# Patient Record
Sex: Female | Born: 1945 | Race: White | Hispanic: No | Marital: Married | Smoking: Never smoker
Health system: Southern US, Community
[De-identification: ages and names within clinical notes are randomized; demographics above are authoritative.]

## PROBLEM LIST (undated history)

## (undated) DIAGNOSIS — F0390 Unspecified dementia without behavioral disturbance: Secondary | ICD-10-CM

## (undated) DIAGNOSIS — F419 Anxiety disorder, unspecified: Secondary | ICD-10-CM

## (undated) DIAGNOSIS — C801 Malignant (primary) neoplasm, unspecified: Secondary | ICD-10-CM

## (undated) DIAGNOSIS — T7840XA Allergy, unspecified, initial encounter: Secondary | ICD-10-CM

## (undated) DIAGNOSIS — K219 Gastro-esophageal reflux disease without esophagitis: Secondary | ICD-10-CM

## (undated) DIAGNOSIS — K37 Unspecified appendicitis: Secondary | ICD-10-CM

## (undated) DIAGNOSIS — K635 Polyp of colon: Secondary | ICD-10-CM

## (undated) HISTORY — DX: Unspecified appendicitis: K37

## (undated) HISTORY — PX: BREAST EXCISIONAL BIOPSY: SUR124

## (undated) HISTORY — PX: APPENDECTOMY: SHX54

## (undated) HISTORY — DX: Allergy, unspecified, initial encounter: T78.40XA

## (undated) HISTORY — DX: Polyp of colon: K63.5

## (undated) HISTORY — DX: Gastro-esophageal reflux disease without esophagitis: K21.9

## (undated) HISTORY — PX: ABDOMINAL HYSTERECTOMY: SHX81

## (undated) HISTORY — DX: Anxiety disorder, unspecified: F41.9

---

## 2012-09-18 ENCOUNTER — Ambulatory Visit: Payer: Self-pay | Admitting: Family Medicine

## 2012-12-22 ENCOUNTER — Ambulatory Visit: Payer: Self-pay | Admitting: Gastroenterology

## 2014-02-10 DIAGNOSIS — M5417 Radiculopathy, lumbosacral region: Secondary | ICD-10-CM | POA: Diagnosis not present

## 2014-02-10 DIAGNOSIS — M9906 Segmental and somatic dysfunction of lower extremity: Secondary | ICD-10-CM | POA: Diagnosis not present

## 2014-02-10 DIAGNOSIS — M9903 Segmental and somatic dysfunction of lumbar region: Secondary | ICD-10-CM | POA: Diagnosis not present

## 2014-02-10 DIAGNOSIS — M79661 Pain in right lower leg: Secondary | ICD-10-CM | POA: Diagnosis not present

## 2014-02-10 DIAGNOSIS — M79662 Pain in left lower leg: Secondary | ICD-10-CM | POA: Diagnosis not present

## 2014-02-11 DIAGNOSIS — H5203 Hypermetropia, bilateral: Secondary | ICD-10-CM | POA: Diagnosis not present

## 2014-04-15 ENCOUNTER — Ambulatory Visit (INDEPENDENT_AMBULATORY_CARE_PROVIDER_SITE_OTHER): Payer: Medicare Other | Admitting: Primary Care

## 2014-04-15 ENCOUNTER — Encounter: Payer: Self-pay | Admitting: Primary Care

## 2014-04-15 VITALS — BP 118/74 | HR 74 | Temp 97.8°F | Ht 59.5 in | Wt 246.4 lb

## 2014-04-15 DIAGNOSIS — R21 Rash and other nonspecific skin eruption: Secondary | ICD-10-CM | POA: Insufficient documentation

## 2014-04-15 DIAGNOSIS — M8580 Other specified disorders of bone density and structure, unspecified site: Secondary | ICD-10-CM | POA: Diagnosis not present

## 2014-04-15 DIAGNOSIS — E669 Obesity, unspecified: Secondary | ICD-10-CM | POA: Diagnosis not present

## 2014-04-15 MED ORDER — TRIAMCINOLONE ACETONIDE 0.1 % EX CREA: 1.0000 "application " | TOPICAL_CREAM | Freq: Two times a day (BID) | CUTANEOUS | Status: DC

## 2014-04-15 NOTE — Assessment & Plan Note (Signed)
Body mass index is 48.95 kg/(m^2).  She's very interested in losing weight and plans to start working more on her diet and exercise. I spent some time stressing the importance of this and she verbalized understanding. Will do fasting physical in April. Will continue to monitor.

## 2014-04-15 NOTE — Progress Notes (Signed)
Pre visit review using our clinic review tool, if applicable. No additional management support is needed unless otherwise documented below in the visit note. 

## 2014-04-15 NOTE — Patient Instructions (Addendum)
Apply cream to rash twice daily. Do not use on face. Continue your efforts of weight loss through your diet and exercise. Take some time for yourself at least once weekly to reduce stress. Schedule a fasting physical with me at your convenience within the next 1-2 months. Welcome to [facility]!

## 2014-04-15 NOTE — Assessment & Plan Note (Signed)
Suspect Eczema. RX for Triamcinolone 0.1% cream for legs. Will continue to monitor.

## 2014-04-15 NOTE — Progress Notes (Signed)
Subjective:    Patient ID: Lindsay Snyder, female    DOB: [birth year], 69 y.o.   MRN: [id number]  HPI  Lindsay Snyder is a 69 year old female who presents today to establish care.  1) Osteopenia: Last Dexa scan was 2014, in which patient was notified that she had osteopenia. She's taking calcium and vitamin D, and is consuming calcium through her diet with milk and salads. She's had 2 fractures as a young adult but nothing within the past 40 years.   2) Obesity: She's very interested in losing weight and lost 10 pounds in 2014 through the Grapefruit Diet. She has not been on the Grapefruit diet in a while, and plans to start it again. She does reports a relatively healthy diet most of the time when not on a specific diet plan. She is not exercising but plans on exercising in the pool once the weather warms up. She is starting to gain some of her weight back from 2014 which she attributes to stress related to family matters.   Body mass index is 48.95 kg/(m^2).  3) Rash: She first noticed this skin irruption three months ago to the left lower leg. The rash was initially itchy in nature so she tried OTC hydrocortisone cream, neosporin, and lotion which helped. The rash is no longer itchy, has not worsened, but is still present with a smaller presentation occuring to the right leg. Nothing makes her rash worse and denies pain.   Review of Systems  Constitutional: Negative for fatigue and unexpected weight change.  HENT: Negative for rhinorrhea.        Has seasonal allergies and history of sinus infections.  Respiratory: Negative for shortness of breath.   Cardiovascular: Negative for chest pain.  Gastrointestinal: Negative for diarrhea and constipation.  Endocrine: Negative for polydipsia, polyphagia and polyuria.  Genitourinary: Negative for dysuria and frequency.  Musculoskeletal: Positive for back pain. Negative for myalgias and arthralgias.       Occasional back pain if sitting in chair the  wrong way.  Skin: Positive for rash.       See HPI.  Neurological: Negative for dizziness and headaches.  Hematological: Negative for adenopathy.  Psychiatric/Behavioral:       Denies anxiety and depression.       Past Medical History  Diagnosis Date  . Appendicitis 8   . Polyp of colon     Benign    History   Social History  . Marital Status: Married    Spouse Name: N/A  . Number of Children: N/A  . Years of Education: N/A   Occupational History  . Not on file.   Social History Main Topics  . Smoking status: Never Smoker   . Smokeless tobacco: Not on file  . Alcohol Use: No  . Drug Use: No  . Sexual Activity: Not on file   Other Topics Concern  . Not on file   Social History Narrative   From [place], moved to [place] for her ill daughter, moved to [place], moved back to [place].   Married. Retired.   Lives in [place].   Enjoys babysitting, writing, reading, cooking.          Past Surgical History  Procedure Laterality Date  . Abdominal hysterectomy    . Appendectomy      Family History  Problem Relation Age of Onset  . Heart attack Father 4    Deceased  . Cancer Father     Colon  .  Cancer Sister     Unusual blood cancer  . Diabetes Mother     Deceased    Allergies  Allergen Reactions  . Tylenol [Acetaminophen]     No current outpatient prescriptions on file prior to visit.   No current facility-administered medications on file prior to visit.    BP 118/74 mmHg  Pulse 74  Temp(Src) 97.8 F (36.6 C) (Oral)  Ht 4' 11.5" (1.511 m)  Wt 246 lb 6.4 oz (111.766 kg)  BMI 48.95 kg/m2  SpO2 98%    Objective:   Physical Exam  Constitutional: She is oriented to person, place, and time. She appears well-developed.  HENT:  Head: Normocephalic.  Right Ear: External ear normal.  Left Ear: External ear normal.  Nose: Nose normal.  Mouth/Throat: Oropharynx is clear and moist.  Eyes: Conjunctivae and EOM are normal. Pupils are equal, round, and  reactive to light.  Neck: Neck supple.  Cardiovascular: Normal rate and regular rhythm.   Pulmonary/Chest: Effort normal and breath sounds normal.  Abdominal: Soft. Bowel sounds are normal. She exhibits no mass. There is no tenderness.  Lymphadenopathy:    She has no cervical adenopathy.  Neurological: She is alert and oriented to person, place, and time.  Skin: Skin is warm and dry. Rash noted.  2 present to anterior left lower extremity proximal to ankle, 4cm and 2cm in diameter.  1 present to anterior right lower extremity proximal to ankle 1.5 cm in diameter.  Psychiatric: She has a normal mood and affect.          Assessment & Plan:

## 2014-04-15 NOTE — Assessment & Plan Note (Signed)
Patient reports from last DEXA which was in 2014. Managed on Calcium, vitamin D, and diet. Will repeat this year.

## 2014-04-27 ENCOUNTER — Encounter: Payer: Self-pay | Admitting: Primary Care

## 2014-04-27 DIAGNOSIS — I8393 Asymptomatic varicose veins of bilateral lower extremities: Secondary | ICD-10-CM | POA: Insufficient documentation

## 2014-05-17 ENCOUNTER — Encounter: Payer: Self-pay | Admitting: Primary Care

## 2014-05-17 ENCOUNTER — Telehealth: Payer: Self-pay | Admitting: *Deleted

## 2014-05-17 ENCOUNTER — Ambulatory Visit (INDEPENDENT_AMBULATORY_CARE_PROVIDER_SITE_OTHER): Payer: Medicare Other | Admitting: Primary Care

## 2014-05-17 VITALS — BP 120/76 | HR 68 | Temp 97.4°F | Ht 60.0 in | Wt 241.8 lb

## 2014-05-17 DIAGNOSIS — Z130 Encounter for screening for diseases of the blood and blood-forming organs and certain disorders involving the immune mechanism: Secondary | ICD-10-CM

## 2014-05-17 DIAGNOSIS — R011 Cardiac murmur, unspecified: Secondary | ICD-10-CM

## 2014-05-17 DIAGNOSIS — Z1239 Encounter for other screening for malignant neoplasm of breast: Secondary | ICD-10-CM | POA: Diagnosis not present

## 2014-05-17 DIAGNOSIS — Z1329 Encounter for screening for other suspected endocrine disorder: Secondary | ICD-10-CM

## 2014-05-17 DIAGNOSIS — E669 Obesity, unspecified: Secondary | ICD-10-CM

## 2014-05-17 DIAGNOSIS — Z13220 Encounter for screening for lipoid disorders: Secondary | ICD-10-CM

## 2014-05-17 DIAGNOSIS — Z23 Encounter for immunization: Secondary | ICD-10-CM | POA: Diagnosis not present

## 2014-05-17 DIAGNOSIS — R21 Rash and other nonspecific skin eruption: Secondary | ICD-10-CM

## 2014-05-17 DIAGNOSIS — Z0000 Encounter for general adult medical examination without abnormal findings: Secondary | ICD-10-CM | POA: Insufficient documentation

## 2014-05-17 DIAGNOSIS — R010 Benign and innocent cardiac murmurs: Secondary | ICD-10-CM

## 2014-05-17 LAB — CBC WITH DIFFERENTIAL/PLATELET
Basophils Absolute: 0 10*3/uL (ref 0.0–0.1)
Basophils Relative: 0.4 % (ref 0.0–3.0)
Eosinophils Absolute: 0.1 10*3/uL (ref 0.0–0.7)
Eosinophils Relative: 1.6 % (ref 0.0–5.0)
HCT: 49.1 % — ABNORMAL HIGH (ref 36.0–46.0)
Hemoglobin: 16.6 g/dL — ABNORMAL HIGH (ref 12.0–15.0)
LYMPHS PCT: 21.2 % (ref 12.0–46.0)
Lymphs Abs: 1.4 10*3/uL (ref 0.7–4.0)
MCHC: 33.8 g/dL (ref 30.0–36.0)
MCV: 85.7 fl (ref 78.0–100.0)
Monocytes Absolute: 0.5 10*3/uL (ref 0.1–1.0)
Monocytes Relative: 7 % (ref 3.0–12.0)
Neutro Abs: 4.6 10*3/uL (ref 1.4–7.7)
Neutrophils Relative %: 69.8 % (ref 43.0–77.0)
Platelets: 216 10*3/uL (ref 150.0–400.0)
RBC: 5.73 Mil/uL — ABNORMAL HIGH (ref 3.87–5.11)
RDW: 14 % (ref 11.5–15.5)
WBC: 6.6 10*3/uL (ref 4.0–10.5)

## 2014-05-17 LAB — LIPID PANEL
CHOL/HDL RATIO: 4
Cholesterol: 228 mg/dL — ABNORMAL HIGH (ref 0–200)
HDL: 56 mg/dL (ref >39.00)
LDL CALC: 139 mg/dL — AB (ref 0–99)
NONHDL: 172
TRIGLYCERIDES: 164 mg/dL — AB (ref 0.0–149.0)
VLDL: 32.8 mg/dL (ref 0.0–40.0)

## 2014-05-17 LAB — COMPREHENSIVE METABOLIC PANEL
ALK PHOS: 64 U/L (ref 39–117)
ALT: 13 U/L (ref 0–35)
AST: 20 U/L (ref 0–37)
Albumin: 4.2 g/dL (ref 3.5–5.2)
BUN: 18 mg/dL (ref 6–23)
CO2: 29 mEq/L (ref 19–32)
Calcium: 9.8 mg/dL (ref 8.4–10.5)
Chloride: 104 mEq/L (ref 96–112)
Creatinine, Ser: 0.95 mg/dL (ref 0.40–1.20)
GFR: 61.85 mL/min (ref >60.00)
Glucose, Bld: 98 mg/dL (ref 70–99)
POTASSIUM: 4.7 meq/L (ref 3.5–5.1)
Sodium: 139 mEq/L (ref 135–145)
Total Bilirubin: 0.8 mg/dL (ref 0.2–1.2)
Total Protein: 7.4 g/dL (ref 6.0–8.3)

## 2014-05-17 NOTE — Patient Instructions (Addendum)
Complete lab work prior to leaving today. I will notify you of your results. You will be contacted regarding your mammogram and echocardiogram (to further evaluate your murmur). You've been provided a Tetanus vaccine today. You are covered for 10 years. Continue your efforts on weight loss! Try to incorporate 30 minutes of daily exercise at least 5 days a week. Limit your carbohydrates, sugars, and substitute with fruits, vegetables, and lean meats. You may purchase Debrox drops/Wax Kit over the counter to help with wax buildup. Follow up in 6 weeks for a check-up on your weight loss efforts. It was great to see you today! Take care!

## 2014-05-17 NOTE — Assessment & Plan Note (Signed)
Health maintenance: Up to date, Tetanus provided today, mammogram ordered today. Diet and Exercise: Lost 5 pounds since last month and has been working to improve her diet. She plans to start exercising this spring in the pool, but in the meantime will start walking around the neighborhood.  No falls, no signs of depression. Labs obtained this afternoon. Will review with patient and treat if necessary.

## 2014-05-17 NOTE — Progress Notes (Signed)
Pre visit review using our clinic review tool, if applicable. No additional management support is needed unless otherwise documented below in the visit note. 

## 2014-05-17 NOTE — Progress Notes (Addendum)
Patient ID: Lindsay Snyder, female   DOB: 07/14/1945, 69 y.o.   MRN: [id number]  HPI: Lindsay Snyder presents today for a medicare annual wellness visit, subsequent.  Past Medical History  Diagnosis Date  . Appendicitis 8   . Polyp of colon     Benign    Current Outpatient Prescriptions  Medication Sig Dispense Refill  . calcium carbonate 200 MG capsule Take 750 mg by mouth daily.    . Cholecalciferol 5000 UNITS capsule Take 5,000 Units by mouth daily.    . Flaxseed, Linseed, (FLAXSEED OIL) 1200 MG CAPS Take 1 capsule by mouth daily.    . Lutein 20 MG CAPS Take 1 capsule by mouth daily.    . Methylsulfonylmethane (MSM) 1500 MG TABS Take 1 tablet by mouth.    . Misc Natural Products (OSTEO BI-FLEX TRIPLE STRENGTH) TABS Take 1 tablet by mouth.    . triamcinolone cream (KENALOG) 0.1 % Apply 1 application topically 2 (two) times daily. 30 g 0   No current facility-administered medications for this visit.    Allergies  Allergen Reactions  . Tylenol [Acetaminophen]     Family History  Problem Relation Age of Onset  . Heart attack Father 87    Deceased  . Cancer Father     Colon  . Cancer Sister     Unusual blood cancer  . Diabetes Mother     Deceased    History   Social History  . Marital Status: Married    Spouse Name: N/A  . Number of Children: N/A  . Years of Education: N/A   Occupational History  . Not on file.   Social History Main Topics  . Smoking status: Never Smoker   . Smokeless tobacco: Not on file  . Alcohol Use: No  . Drug Use: No  . Sexual Activity: Not on file   Other Topics Concern  . Not on file   Social History Narrative   From [place], moved to [place] for her ill daughter, moved to [place], moved back to [place].   Married. Retired.   Lives in [place].   Enjoys babysitting, writing, reading, cooking.          Hospitiliaztions: None  Health Maintenance:    Flu: Did not have one in Fall 2015.  Tetanus: Last one 10 years ago.  Prevnar:  2014  Zostavax: Reports she's never had chicken pox.  Bone Density: Last Dexa 08/2012, due in August 2016.  Colonoscopy: Last one was 2014, due in November 2019  Eye Doctor: Last appointment Winter 2015. Starting to get cataracts, on Lutein 20mg .  Dental Exam: Has not been in a while, plans on an appointment in August. Has no complaints.  Mammogram: Due, will order today.       Providers: Marcina Millard, O.D - Optometrist, Alma Friendly AGNP-C - PCP   I have personally reviewed and have noted: 1. The patient's medical and social history 2. Their use of alcohol, tobacco or illicit drugs. Special occasions will have a beer, no tobacco or illicit drugs. 3. Their current medications and supplements 4. The patient's functional ability including ADL's, fall risks, home safety risks and hearing or visual impairment. 5. Diet and physical activities 6. Evidence for depression or mood disorder  Subjective:   Review of Systems:   Constitutional: Denies fever, malaise, fatigue, headache or abrupt weight changes. Feels cold. HEENT: Denies eye pain, eye redness, ear pain, ringing in the ears, wax buildup, runny nose, nasal congestion, bloody nose,  or sore throat. Does have ear fullness, sinus pressure to maxillary/frontal lobes. Has been present for 2 weeks. Clear drainage. Has not taken anything OTC. Respiratory: Denies difficulty breathing, shortness of breath, cough or sputum production.   Cardiovascular: Denies chest pain, chest tightness, palpitations or swelling in the hands or feet.  Gastrointestinal: Denies abdominal pain, bloating, constipation, diarrhea or blood in the stool.  GU: Denies urgency, frequency, pain with urination, burning sensation, blood in urine, odor or discharge. Musculoskeletal: Denies decrease in range of motion, difficulty with gait, muscle pain or joint pain and swelling.  Skin: Denies redness, rashes, lesions or ulcercations. Rash is improving bilaterally from last  visit. Neurological: Denies dizziness, difficulty with memory, difficulty with speech or problems with balance and coordination.   No other specific complaints in a complete review of systems (except as listed in HPI above).  Objective:  PE:   BP 120/76 mmHg  Pulse 68  Temp(Src) 97.4 F (36.3 C) (Oral)  Ht 5' (1.524 m)  Wt 241 lb 12.8 oz (109.68 kg)  BMI 47.22 kg/m2  SpO2 98% Wt Readings from Last 3 Encounters:  05/17/14 241 lb 12.8 oz (109.68 kg)  04/15/14 246 lb 6.4 oz (111.766 kg)    General: Appears their stated age, well developed, well nourished in NAD. Skin: Warm, dry and intact. No rashes, lesions or ulcerations noted. HEENT: Head: normal shape and size; Eyes: sclera white, no icterus, conjunctiva pink, PERRLA and EOMs intact; Ears: Tm's gray and intact, normal light reflex; Nose: mucosa pink and moist, septum midline; Throat/Mouth: Teeth present, mucosa pink and moist, no exudate, lesions or ulcerations noted. Tender to maxillary and frontal sinuses.  Neck: Normal range of motion. Neck supple, trachea midline. No massses, lumps or thyromegaly present.  Cardiovascular: Normal rate and rhythm. S1,S2 noted.  + murmur noted on exam- patient has no prior history,  rubs or gallops noted. No JVD or BLE edema. No carotid bruits noted. Pulmonary/Chest: Normal effort and positive vesicular breath sounds. No respiratory distress. No wheezes, rales or ronchi noted.  Abdomen: Soft and nontender. Normal bowel sounds, no bruits noted. No distention or masses noted. Liver, spleen and kidneys non palpable. Musculoskeletal: Normal range of motion. No signs of joint swelling. No difficulty with gait.  Neurological: Alert and oriented. Cranial nerves II-XII intact. Coordination normal. +DTRs bilaterally. Psychiatric: Mood and affect normal. Behavior is normal. Judgment and thought content normal.   BMET No results found for: NA, K, CL, CO2, GLUCOSE, BUN, CREATININE, CALCIUM, GFRNONAA,  GFRAA  Lipid Panel  No results found for: CHOL, TRIG, HDL, CHOLHDL, VLDL, LDLCALC  CBC No results found for: WBC, RBC, HGB, HCT, PLT, MCV, MCH, MCHC, RDW, LYMPHSABS, MONOABS, EOSABS, BASOSABS  Hgb A1C No results found for: HGBA1C    Assessment and Plan:   Medicare Annual Wellness Visit:  Diet: Has been working on a healthier diet including fruits, vegetables, water. She's been participating on the "grapefruit" diet. Wt Readings from Last 3 Encounters:  05/17/14 241 lb 12.8 oz (109.68 kg)  04/15/14 246 lb 6.4 oz (111.766 kg)   Physical activity: Does not currently, but does housework daily; plans to start exercising in the pool once open. Depression/mood screen: Negative Hearing: Intact to whispered voice Visual acuity: Grossly normal, performs annual eye exam  ADLs: Capable Fall risk: None Home safety: Good Cognitive evaluation: Intact to orientation, naming, recall and repetition EOL planning: Adv directives, full code.  Sinusitis present upon exam and recommended antibiotics due to duration of symptoms and presentation.  She declines at this time.  Next appointment:  May-June 2016 to follow upon weight loss efforts.

## 2014-05-17 NOTE — Assessment & Plan Note (Signed)
Improved and is nearly resolved. She has no complaints.

## 2014-05-17 NOTE — Progress Notes (Signed)
   Subjective:    Patient ID: Lindsay Snyder, female    DOB: January 27, 1946, 69 y.o.   MRN: [id number]  HPI  Lindsay Snyder is a 69 year old female who presents today for complete physical.  Immunizations: -Tetanus -Influenza -Pneumonia -Shingles  Diet: Exercise: Colonoscopy: Last was in 2014, due in 2019 Dexa: Last Dexa was in 08/2012, due this August for repeat. Pap Smear: Previous Paps were  Mammogram:   Review of Systems     Objective:   Physical Exam        Assessment & Plan:

## 2014-05-17 NOTE — Assessment & Plan Note (Signed)
Noted on examination today. Systolic, mitral involvement. No radiation to carotids.  She denies history of and has no complaints of weakness, shortness of breath, chest pain. Will obtain 2D echo for further evaluation.

## 2014-05-17 NOTE — Telephone Encounter (Signed)
lmov to let pt know we had to cancel her apt, for we are going on a new system and we will call back when we are able to sch apt

## 2014-05-18 ENCOUNTER — Other Ambulatory Visit (INDEPENDENT_AMBULATORY_CARE_PROVIDER_SITE_OTHER): Payer: Medicare Other

## 2014-05-18 DIAGNOSIS — Z131 Encounter for screening for diabetes mellitus: Secondary | ICD-10-CM | POA: Diagnosis not present

## 2014-05-18 LAB — HEMOGLOBIN A1C: HEMOGLOBIN A1C: 5.5 % (ref 4.6–6.5)

## 2014-05-20 ENCOUNTER — Other Ambulatory Visit (HOSPITAL_COMMUNITY): Payer: Medicare Other

## 2014-05-20 ENCOUNTER — Telehealth: Payer: Self-pay | Admitting: Primary Care

## 2014-05-20 DIAGNOSIS — J019 Acute sinusitis, unspecified: Principal | ICD-10-CM

## 2014-05-20 DIAGNOSIS — B9689 Other specified bacterial agents as the cause of diseases classified elsewhere: Secondary | ICD-10-CM

## 2014-05-20 MED ORDER — AMOXICILLIN-POT CLAVULANATE 875-125 MG PO TABS: 1.0000 | ORAL_TABLET | Freq: Two times a day (BID) | ORAL | Status: DC

## 2014-05-20 NOTE — Telephone Encounter (Signed)
Pt left v/m returning call and pt request cb; did not leave contact #.

## 2014-05-20 NOTE — Telephone Encounter (Signed)
Pt called stating she saw kate on Monday.  At that appointment pt didn't want an antibiotic.  She thinks she needs one now not feeling any better She has fever, one ear is still stop up. She has an headache [facility]

## 2014-05-20 NOTE — Addendum Note (Signed)
Addended by: Pleas Koch on: 05/20/2014 04:31 PM   Modules accepted: Miquel Dunn

## 2014-05-20 NOTE — Telephone Encounter (Signed)
Called patient and left message. Will call in Augmentin for suspected bacterial sinusitis. This was obvious during her exam on 4/18 but she declined antibiotics at that time.  Vallarie Mare, will you ensure that she has received this message and picked up her antibiotics tomorrow (Friday).  Thanks!

## 2014-05-21 NOTE — Telephone Encounter (Signed)
Called patient and notified her of Kate's comments. Patient verbalized understanding. Patient will call if worsen or no improvement after taking antibiotics.

## 2014-05-24 ENCOUNTER — Telehealth: Payer: Self-pay | Admitting: Primary Care

## 2014-05-24 DIAGNOSIS — B9689 Other specified bacterial agents as the cause of diseases classified elsewhere: Secondary | ICD-10-CM

## 2014-05-24 DIAGNOSIS — J019 Acute sinusitis, unspecified: Principal | ICD-10-CM

## 2014-05-24 MED ORDER — DOXYCYCLINE HYCLATE 100 MG PO TABS: 100.0000 mg | ORAL_TABLET | Freq: Two times a day (BID) | ORAL | Status: DC

## 2014-05-24 NOTE — Telephone Encounter (Signed)
Called and notified patient of Kate's comments. Patient verbalized understanding.  

## 2014-05-24 NOTE — Telephone Encounter (Addendum)
Pt left v/m; pt has taken 4 pills of abx and then stopped abx due to diarrhea and dizziness. Pt wants to know if should get different abx or try Augmentin again. Pt request cb.[facility].

## 2014-05-24 NOTE — Telephone Encounter (Signed)
Please notify Ms. Beecham to stop taking the Augmentin and that I have sent in Doxycycline tablets. She will take one tablet by mouth twice daily for 10 days. Let me know if she has any additional questions. Thanks!

## 2014-05-25 ENCOUNTER — Ambulatory Visit (HOSPITAL_COMMUNITY): Payer: Medicare Other | Attending: Primary Care

## 2014-05-25 DIAGNOSIS — R010 Benign and innocent cardiac murmurs: Secondary | ICD-10-CM | POA: Insufficient documentation

## 2014-05-25 DIAGNOSIS — R011 Cardiac murmur, unspecified: Secondary | ICD-10-CM | POA: Diagnosis not present

## 2014-05-25 NOTE — Progress Notes (Signed)
2D Echo completed. 05/25/2014

## 2014-05-31 ENCOUNTER — Other Ambulatory Visit: Payer: Medicare Other

## 2014-06-16 ENCOUNTER — Other Ambulatory Visit: Payer: Self-pay | Admitting: Primary Care

## 2014-06-16 ENCOUNTER — Ambulatory Visit
Admission: RE | Admit: 2014-06-16 | Discharge: 2014-06-16 | Disposition: A | Discharge status: Home / Self Care | Payer: Medicare Other | Source: Ambulatory Visit | Attending: Primary Care | Admitting: Primary Care

## 2014-06-16 DIAGNOSIS — R922 Inconclusive mammogram: Secondary | ICD-10-CM | POA: Diagnosis not present

## 2014-06-16 DIAGNOSIS — Z1239 Encounter for other screening for malignant neoplasm of breast: Secondary | ICD-10-CM | POA: Diagnosis not present

## 2014-06-29 ENCOUNTER — Encounter: Payer: Self-pay | Admitting: Primary Care

## 2014-06-29 ENCOUNTER — Ambulatory Visit (INDEPENDENT_AMBULATORY_CARE_PROVIDER_SITE_OTHER): Payer: Medicare Other | Admitting: Primary Care

## 2014-06-29 VITALS — BP 118/66 | HR 88 | Temp 97.6°F | Ht 60.0 in | Wt 240.8 lb

## 2014-06-29 DIAGNOSIS — E669 Obesity, unspecified: Secondary | ICD-10-CM

## 2014-06-29 DIAGNOSIS — R011 Cardiac murmur, unspecified: Secondary | ICD-10-CM | POA: Diagnosis not present

## 2014-06-29 NOTE — Assessment & Plan Note (Signed)
Weight loss of 6 pounds since initial visit. Commended patient on efforts. Encouraged her to exercise daily, even if it's walking outside for 30-45 min daily.  Follow up in 3 months for re-evaluation.

## 2014-06-29 NOTE — Progress Notes (Signed)
Subjective:    Patient ID: Lindsay Snyder, female    DOB: [birth year], 69 y.o.   MRN: [id number]  HPI  Lindsay Snyder is a 69 year old female who presents today for follow up.  She has been on the "Grapefruit diet" but for the past several weeks but is tired of it and plans on losing weight through a healthy diet and exercise.  This weekend she presented to the grocery and bought fresh fruits and vegetables. She plans on cutting out sugars, starchy foods (potatoes, breads), and fatty meats that she was eating on the "grapefruit diet". She is currently not exercising due to dealing with family stressors but plans on swimming a lot this summer.   Breakfast: Berniece Salines and eggs Lunch: Salad or meat and vegetables Dinner: Salad or meat and vegetables  She's not currently eating desserts.  She is drinking mostly grapefruit juice, water, decaf tea.   Wt Readings from Last 3 Encounters:  06/29/14 240 lb 12.8 oz (109.226 kg)  05/17/14 241 lb 12.8 oz (109.68 kg)  04/15/14 246 lb 6.4 oz (111.766 kg)     Review of Systems  Respiratory: Negative for shortness of breath.   Cardiovascular: Negative for chest pain.  Neurological: Negative for dizziness and weakness.       Past Medical History  Diagnosis Date  . Appendicitis 8   . Polyp of colon     Benign    History   Social History  . Marital Status: Married    Spouse Name: N/A  . Number of Children: N/A  . Years of Education: N/A   Occupational History  . Not on file.   Social History Main Topics  . Smoking status: Never Smoker   . Smokeless tobacco: Not on file  . Alcohol Use: No  . Drug Use: No  . Sexual Activity: Not on file   Other Topics Concern  . Not on file   Social History Narrative   From [place], moved to [place] for her ill daughter, moved to [place], moved back to [place].   Married. Retired.   Lives in [place].   Enjoys babysitting, writing, reading, cooking.          Past Surgical History  Procedure Laterality  Date  . Abdominal hysterectomy    . Appendectomy    . Breast excisional biopsy Right     + 20 years    Family History  Problem Relation Age of Onset  . Heart attack Father 76    Deceased  . Cancer Father     Colon  . Cancer Sister     Unusual blood cancer  . Diabetes Mother     Deceased    Allergies  Allergen Reactions  . Tylenol [Acetaminophen]     Current Outpatient Prescriptions on File Prior to Visit  Medication Sig Dispense Refill  . calcium carbonate 200 MG capsule Take 750 mg by mouth daily.    . Cholecalciferol 5000 UNITS capsule Take 5,000 Units by mouth daily.    Marland Kitchen doxycycline (VIBRA-TABS) 100 MG tablet Take 1 tablet (100 mg total) by mouth 2 (two) times daily. 20 tablet 0  . Flaxseed, Linseed, (FLAXSEED OIL) 1200 MG CAPS Take 1 capsule by mouth daily.    . Lutein 20 MG CAPS Take 1 capsule by mouth daily.    . Methylsulfonylmethane (MSM) 1500 MG TABS Take 1 tablet by mouth.    . Misc Natural Products (OSTEO BI-FLEX TRIPLE STRENGTH) TABS Take 1 tablet by  mouth.     No current facility-administered medications on file prior to visit.    BP 118/66 mmHg  Pulse 88  Temp(Src) 97.6 F (36.4 C) (Oral)  Ht 5' (1.524 m)  Wt 240 lb 12.8 oz (109.226 kg)  BMI 47.03 kg/m2  SpO2 98%    Objective:   Physical Exam  Constitutional: She is oriented to person, place, and time. She appears well-nourished.  Cardiovascular: Normal rate and regular rhythm.   Pulmonary/Chest: Effort normal and breath sounds normal.  Neurological: She is alert and oriented to person, place, and time.  Skin: Skin is warm and dry.          Assessment & Plan:

## 2014-06-29 NOTE — Progress Notes (Signed)
Pre visit review using our clinic review tool, if applicable. No additional management support is needed unless otherwise documented below in the visit note. 

## 2014-06-29 NOTE — Patient Instructions (Signed)
You've lost 6 pounds since March! Keep up the good work. It is important to limit carbohydrates in the form of white bread, rice, pasta, cakes, cookies, sugary drinks, etc. Increase your consumption of fresh fruits and vegetables. Be sure to drink plenty of water daily.  Follow up in 3 months for follow up of weight loss. Please don't hesitate to call me if you have any concerns. Take time for yourself! It was nice seeing you!

## 2014-06-29 NOTE — Assessment & Plan Note (Signed)
Discussed echo results with patient as she was worried. Aortic stenosis present on Echo with EF of 55-60%. Education provided that her results are nothing to worry about at this time.

## 2014-07-26 ENCOUNTER — Telehealth: Payer: Self-pay | Admitting: Primary Care

## 2014-07-26 NOTE — Telephone Encounter (Signed)
Pt stated she has cold.  She has started cough up phlem  She wanted to know if kate would call her in some cough med  [facility]

## 2014-07-26 NOTE — Telephone Encounter (Signed)
Called patient and asked her regarding the OTC medication below. Patient stated she is talking OTC medications. Patient was describing her symptoms--she stated scratchy throat, sore throat, and productive cough. She did not very well over the phone. Suggested to patient that she may need to be seen. Patient decline apt. She stated she feels better than this morning and will wait until tomorrow morning if not better then will call.

## 2014-07-26 NOTE — Telephone Encounter (Signed)
She may try Delsym or Robitussin OTC for cough. Has she tried either of these things?

## 2014-07-27 ENCOUNTER — Ambulatory Visit: Payer: Medicare Other | Admitting: Primary Care

## 2014-08-03 ENCOUNTER — Ambulatory Visit: Payer: Medicare Other | Admitting: Family Medicine

## 2014-08-04 ENCOUNTER — Encounter: Payer: Self-pay | Admitting: Family Medicine

## 2014-08-04 ENCOUNTER — Ambulatory Visit (INDEPENDENT_AMBULATORY_CARE_PROVIDER_SITE_OTHER): Payer: Medicare Other | Admitting: Family Medicine

## 2014-08-04 VITALS — BP 114/82 | HR 69 | Temp 98.5°F | Ht 60.0 in | Wt 240.5 lb

## 2014-08-04 DIAGNOSIS — J320 Chronic maxillary sinusitis: Secondary | ICD-10-CM | POA: Diagnosis not present

## 2014-08-04 MED ORDER — AMOXICILLIN-POT CLAVULANATE 875-125 MG PO TABS: 1.0000 | ORAL_TABLET | Freq: Two times a day (BID) | ORAL | Status: DC

## 2014-08-04 NOTE — Progress Notes (Signed)
Pre visit review using our clinic review tool, if applicable. No additional management support is needed unless otherwise documented below in the visit note. 

## 2014-08-04 NOTE — Progress Notes (Signed)
Dr. Frederico Hamman T. Maki Sweetser, MD, [facility] [place], [address] Phone: [phone] Fax: [phone]  08/04/2014  Patient: Lindsay Snyder, MRN: [id number], DOB: 1945/03/06, 69 y.o.  Primary Physician:  Sheral Flow, NP  Chief Complaint: Sinusitis  Subjective:   This 69 y.o. female patient presents with runny nose, sneezing, cough, sore throat, malaise and minimal / low-grade fever for > 1 week. Now the primary complaint has become sinus pressure and pain behind the eyes and in the upper, anterior face.   Has been feeling bad for about 2-3 weeks.  Wynia and yellow discharge.   Took some cough syrup - did not help all that much.  Took some mucinex, helped a little bit.   The patent denies sore throat as the primary complaint. Denies sthortness of breath/wheezing, high fever, chest pain, significant myalgia, otalgia, abdominal pain, changes in bowel or bladder.  PMH, PHS, Allergies, Problem List, Medications, Family History, and Social History have all been reviewed.  Patient Active Problem List   Diagnosis Date Noted  . Medicare annual wellness visit, subsequent 05/17/2014  . Murmur, cardiac 05/17/2014  . 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  . Rash and nonspecific skin eruption 04/15/2014  . Osteopenia 04/15/2014  . Obesity 04/15/2014    Past Medical History  Diagnosis Date  . Appendicitis 8   . Polyp of colon     Benign    Past Surgical History  Procedure Laterality Date  . Abdominal hysterectomy    . Appendectomy    . Breast excisional biopsy Right     + 20 years    History   Social History  . Marital Status: Married    Spouse Name: N/A  . Number of Children: N/A  . Years of Education: N/A   Occupational History  . Not on file.   Social History Main Topics  . Smoking status: Never Smoker   . Smokeless tobacco: Never Used  . Alcohol Use: No  . Drug Use: No  . Sexual  Activity: Not on file   Other Topics Concern  . Not on file   Social History Narrative   From [place], moved to [place] for her ill daughter, moved to [place], moved back to [place].   Married. Retired.   Lives in [place].   Enjoys babysitting, writing, reading, cooking.          Family History  Problem Relation Age of Onset  . Heart attack Father 22    Deceased  . Cancer Father     Colon  . Cancer Sister     Unusual blood cancer  . Diabetes Mother     Deceased    Allergies  Allergen Reactions  . Tylenol [Acetaminophen]     Medication list reviewed and updated in full in [facility].  ROS as above, eating and drinking - tolerating PO. Urinating normally. No excessive vomitting or diarrhea. O/w as above.  Objective:   Blood pressure 114/82, pulse 69, temperature 98.5 F (36.9 C), temperature source Oral, height 5' (1.524 m), weight 240 lb 8 oz (109.09 kg).  GEN: WDWN, Non-toxic, Atraumatic, normocephalic. A and O x 3. HEENT: Oropharynx clear without exudate, MMM, no significant LAD, mild rhinnorhea Sinuses: Right Frontal, ethmoid, and maxillary: Tender at max Left Frontal, Ethmoid, and maxillary: nonTender Ears: TM clear, COL visualized with good landmarks CV: RRR, no m/g/r. Pulm: CTA B, no wheezes, rhonchi, or crackles, normal respiratory effort.  EXT: no c/c/e Psych: well oriented, neither depressed nor anxious in appearance  Assessment and Plan:   Right maxillary sinusitis  Acute sinusitis: ABX as below.   Reviewed symptomatic care as well as ABX in this case.    Follow-up: prn  New Prescriptions   AMOXICILLIN-CLAVULANATE (AUGMENTIN) 875-125 MG PER TABLET    Take 1 tablet by mouth 2 (two) times daily.   No orders of the defined types were placed in this encounter.    Signed,  Maud Deed. Donney Caraveo, MD  Patient's Medications  New Prescriptions   AMOXICILLIN-CLAVULANATE (AUGMENTIN) 875-125 MG PER TABLET    Take 1 tablet by mouth 2 (two) times daily.    Previous Medications   ASPIRIN 81 MG TABLET    Take 81 mg by mouth daily.   CALCIUM CARBONATE 200 MG CAPSULE    Take 750 mg by mouth daily.   CHOLECALCIFEROL 5000 UNITS CAPSULE    Take 5,000 Units by mouth daily.   FLAXSEED, LINSEED, (FLAXSEED OIL) 1200 MG CAPS    Take 1 capsule by mouth daily.   LUTEIN 20 MG CAPS    Take 1 capsule by mouth daily.   MISC NATURAL PRODUCTS (OSTEO BI-FLEX TRIPLE STRENGTH) TABS    Take 2 tablets by mouth.   Modified Medications   No medications on file  Discontinued Medications   DOXYCYCLINE (VIBRA-TABS) 100 MG TABLET    Take 1 tablet (100 mg total) by mouth 2 (two) times daily.   METHYLSULFONYLMETHANE (MSM) 1500 MG TABS    Take 1 tablet by mouth.

## 2014-09-02 DIAGNOSIS — D239 Other benign neoplasm of skin, unspecified: Secondary | ICD-10-CM | POA: Diagnosis not present

## 2014-09-02 DIAGNOSIS — D485 Neoplasm of uncertain behavior of skin: Secondary | ICD-10-CM | POA: Diagnosis not present

## 2014-09-02 DIAGNOSIS — L820 Inflamed seborrheic keratosis: Secondary | ICD-10-CM | POA: Diagnosis not present

## 2014-09-02 DIAGNOSIS — L739 Follicular disorder, unspecified: Secondary | ICD-10-CM | POA: Diagnosis not present

## 2014-09-30 ENCOUNTER — Encounter: Payer: Self-pay | Admitting: Primary Care

## 2014-09-30 ENCOUNTER — Ambulatory Visit (INDEPENDENT_AMBULATORY_CARE_PROVIDER_SITE_OTHER): Payer: Medicare Other | Admitting: Primary Care

## 2014-09-30 VITALS — BP 126/86 | HR 60 | Temp 97.4°F | Ht 60.0 in | Wt 234.8 lb

## 2014-09-30 DIAGNOSIS — E669 Obesity, unspecified: Secondary | ICD-10-CM

## 2014-09-30 DIAGNOSIS — F5102 Adjustment insomnia: Secondary | ICD-10-CM

## 2014-09-30 MED ORDER — TRAZODONE HCL 50 MG PO TABS: 50.0000 mg | ORAL_TABLET | Freq: Every evening | ORAL | Status: DC | PRN

## 2014-09-30 NOTE — Patient Instructions (Signed)
Continue your efforts towards weight loss through healthy diet and exercise.  You should aim to get 1 hour of moderate intensity exercise 4-5 times weekly.  Start Trazodone tablets for sleep. Take 1-2 tablets by mouth 30 minutes to 1 hour before bed for sleep.  Please schedule a follow up appointment in 6 weeks for re-evaluation of stress and sleep.  It was a pleasure to see you today!

## 2014-09-30 NOTE — Assessment & Plan Note (Signed)
Weight loss of 6 pounds since last visit. Commended patient on this success. Currently improving her diet and is not exercising. She believes her weight loss has been stagnant due to recent family stress and lack of sleep. Will continue to monitor weight.

## 2014-09-30 NOTE — Progress Notes (Signed)
Pre visit review using our clinic review tool, if applicable. No additional management support is needed unless otherwise documented below in the visit note. 

## 2014-09-30 NOTE — Assessment & Plan Note (Signed)
History of insomnia in the past. Symptoms today represent insomnia due to family stress. Does not seem to have regular anxiety. Start Trazodone 50 mg. 1-2 tabs HS. Discussed potential side effects. Follow up in 6 weeks for re-evaluation.

## 2014-09-30 NOTE — Progress Notes (Signed)
Subjective:    Patient ID: Lindsay Snyder, female    DOB: 08-20-1945, 69 y.o.   MRN: [id number]  HPI  Lindsay Snyder is a 69 year old female who presents today for follow up of weight loss efforts. She was last evaluated on 5/31 for weight loss efforts. At that time she was still participating in the "Grapefruit Diet" but expressed frustration and was determined to lose weight by cutting out sugars, starchy foods, and fatty meats.  Since her last visit she's lost 6 pounds. She is becoming frustrated as her weight has been stagnant due to recent family stress.   Her diet currently consists of: Breakfast: [place] peanut butter bars, tea Lunch: Salad or Meat and Veggies, or shredded wheat Dinner: Aeronautical engineer and Veggies, occasionally wheat bread; or shredded wheat Snack: Bowl of cereal, fruit Desserts: None Beverages: Water, tea with stevia, Grapefruit juice with stevia  She is not currently exercising.  Wt Readings from Last 3 Encounters:  09/30/14 234 lb 12.8 oz (106.505 kg)  08/04/14 240 lb 8 oz (109.09 kg)  06/29/14 240 lb 12.8 oz (109.226 kg)   2) Insomnia: Longstanding history. Recently is going through stress as her son is going through a difficult divorce and her daughter is going to have surgery. She has difficulty falling asleep and staying asleep and is getting about 2-4 hours of sleep on average. She has tried valerian root, melatonin, natural sleep aids from a health store without relief. She denies daily worry and irritability. She does have muscle tension, difficulty sleeping, and is going through family stress. PHQ 2 score of 0 today.  Review of Systems  Constitutional: Negative for unexpected weight change.  Respiratory: Negative for shortness of breath.   Cardiovascular: Negative for chest pain.  Neurological: Negative for dizziness and headaches.  Psychiatric/Behavioral: Positive for sleep disturbance. Negative for suicidal ideas. The patient is not  nervous/anxious.        Past Medical History  Diagnosis Date  . Appendicitis 8   . Polyp of colon     Benign    Social History   Social History  . Marital Status: Married    Spouse Name: N/A  . Number of Children: N/A  . Years of Education: N/A   Occupational History  . Not on file.   Social History Main Topics  . Smoking status: Never Smoker   . Smokeless tobacco: Never Used  . Alcohol Use: No  . Drug Use: No  . Sexual Activity: Not on file   Other Topics Concern  . Not on file   Social History Narrative   From [place], moved to [place] for her ill daughter, moved to [place], moved back to [place].   Married. Retired.   Lives in [place].   Enjoys babysitting, writing, reading, cooking.          Past Surgical History  Procedure Laterality Date  . Abdominal hysterectomy    . Appendectomy    . Breast excisional biopsy Right     + 20 years    Family History  Problem Relation Age of Onset  . Heart attack Father 21    Deceased  . Cancer Father     Colon  . Cancer Sister     Unusual blood cancer  . Diabetes Mother     Deceased    Allergies  Allergen Reactions  . Tylenol [Acetaminophen]     Current Outpatient Prescriptions on File Prior to Visit  Medication Sig Dispense  Refill  . aspirin 81 MG tablet Take 81 mg by mouth daily.    . calcium carbonate 200 MG capsule Take 750 mg by mouth daily.    . Cholecalciferol 5000 UNITS capsule Take 5,000 Units by mouth daily.    . Flaxseed, Linseed, (FLAXSEED OIL) 1200 MG CAPS Take 1 capsule by mouth daily.    . Lutein 20 MG CAPS Take 1 capsule by mouth daily.    . Misc Natural Products (OSTEO BI-FLEX TRIPLE STRENGTH) TABS Take 2 tablets by mouth.      No current facility-administered medications on file prior to visit.    BP 126/86 mmHg  Pulse 60  Temp(Src) 97.4 F (36.3 C) (Oral)  Ht 5' (1.524 m)  Wt 234 lb 12.8 oz (106.505 kg)  BMI 45.86 kg/m2  SpO2 97%    Objective:   Physical Exam  Constitutional:  She appears well-nourished.  Cardiovascular: Normal rate and regular rhythm.   Murmur heard. Pulmonary/Chest: Effort normal and breath sounds normal.  Skin: Skin is warm and dry.  Psychiatric: She has a normal mood and affect.          Assessment & Plan:

## 2014-11-11 ENCOUNTER — Ambulatory Visit (INDEPENDENT_AMBULATORY_CARE_PROVIDER_SITE_OTHER): Payer: Medicare Other | Admitting: Primary Care

## 2014-11-11 ENCOUNTER — Encounter: Payer: Self-pay | Admitting: Primary Care

## 2014-11-11 VITALS — BP 126/86 | HR 70 | Temp 97.4°F | Ht 60.0 in | Wt 234.8 lb

## 2014-11-11 DIAGNOSIS — F5102 Adjustment insomnia: Secondary | ICD-10-CM | POA: Diagnosis not present

## 2014-11-11 NOTE — Patient Instructions (Signed)
Continue to work on weight loss efforts.   Continue Trazodone tablets as needed for sleep.  Follow up in April for annual physical.   It was a pleasure to see you today!

## 2014-11-11 NOTE — Assessment & Plan Note (Signed)
Improved on Trazodone 50 mg. Will occasionally need to take 2 tablets. Feeling less stressed despite current stress at home. Exam unremarkable. Follow up in April.

## 2014-11-11 NOTE — Progress Notes (Signed)
Subjective:    Patient ID: Lindsay Snyder, female    DOB: 1945-08-17, 69 y.o.   MRN: [id number]  HPI  Lindsay Snyder is a 69 year old female who presents today for follow up of insomnia. She was evaluated on 09/30/2014 with complaints of difficulty sleeping. She has a long standing history of difficulty sleeping and has tried valerian root, melatonin, and other natural sleep aids without improvement. She is going through a lot of stress with family life. She was initiated on Trazodone 50 mg to help with sleep.  Since her last visit she's noticed significant improvement, but does have some nights where she will need a second tablet. She continues to experience stress at home but feels better overall as she is resting.  Wt Readings from Last 3 Encounters:  11/11/14 234 lb 12.8 oz (106.505 kg)  09/30/14 234 lb 12.8 oz (106.505 kg)  08/04/14 240 lb 8 oz (109.09 kg)     Review of Systems  Respiratory: Negative for shortness of breath.   Cardiovascular: Negative for chest pain.  Neurological: Negative for dizziness and headaches.  Psychiatric/Behavioral: Negative for sleep disturbance.       Past Medical History  Diagnosis Date  . Appendicitis 8   . Polyp of colon     Benign    Social History   Social History  . Marital Status: Married    Spouse Name: N/A  . Number of Children: N/A  . Years of Education: N/A   Occupational History  . Not on file.   Social History Main Topics  . Smoking status: Never Smoker   . Smokeless tobacco: Never Used  . Alcohol Use: No  . Drug Use: No  . Sexual Activity: Not on file   Other Topics Concern  . Not on file   Social History Narrative   From [place], moved to [place] for her ill daughter, moved to [place], moved back to [place].   Married. Retired.   Lives in [place].   Enjoys babysitting, writing, reading, cooking.          Past Surgical History  Procedure Laterality Date  . Abdominal hysterectomy    . Appendectomy    . Breast  excisional biopsy Right     + 20 years    Family History  Problem Relation Age of Onset  . Heart attack Father 22    Deceased  . Cancer Father     Colon  . Cancer Sister     Unusual blood cancer  . Diabetes Mother     Deceased    Allergies  Allergen Reactions  . Tylenol [Acetaminophen]     Current Outpatient Prescriptions on File Prior to Visit  Medication Sig Dispense Refill  . aspirin 81 MG tablet Take 81 mg by mouth daily.    . calcium carbonate 200 MG capsule Take 750 mg by mouth daily.    . Cholecalciferol 5000 UNITS capsule Take 5,000 Units by mouth daily.    . Flaxseed, Linseed, (FLAXSEED OIL) 1200 MG CAPS Take 1 capsule by mouth daily.    . Lutein 20 MG CAPS Take 1 capsule by mouth daily.    . Misc Natural Products (OSTEO BI-FLEX TRIPLE STRENGTH) TABS Take 2 tablets by mouth.     . traZODone (DESYREL) 50 MG tablet Take 1-2 tablets (50-100 mg total) by mouth at bedtime as needed for sleep. 60 tablet 2   No current facility-administered medications on file prior to visit.    BP  126/86 mmHg  Pulse 70  Temp(Src) 97.4 F (36.3 C) (Oral)  Ht 5' (1.524 m)  Wt 234 lb 12.8 oz (106.505 kg)  BMI 45.86 kg/m2  SpO2 98%    Objective:   Physical Exam  Constitutional: She appears well-nourished.  Cardiovascular: Normal rate and regular rhythm.   Murmur heard. Pulmonary/Chest: Effort normal and breath sounds normal.  Skin: Skin is warm and dry.  Psychiatric: She has a normal mood and affect.          Assessment & Plan:

## 2014-11-11 NOTE — Progress Notes (Signed)
Pre visit review using our clinic review tool, if applicable. No additional management support is needed unless otherwise documented below in the visit note. 

## 2014-12-28 ENCOUNTER — Other Ambulatory Visit: Payer: Self-pay | Admitting: Primary Care

## 2014-12-28 NOTE — Telephone Encounter (Signed)
Electronically refill request for   traZODone (DESYREL) 50 MG tablet   Take 1-2 tablets (50-100 mg total) by mouth at bedtime as needed for sleep.  Dispense: 60 tablet   Refills: 2     Last prescribed on  09/30/2014. Last seen on 11/11/2014. CPE on 05/18/2015.

## 2015-03-03 DIAGNOSIS — D239 Other benign neoplasm of skin, unspecified: Secondary | ICD-10-CM | POA: Diagnosis not present

## 2015-03-03 DIAGNOSIS — I872 Venous insufficiency (chronic) (peripheral): Secondary | ICD-10-CM | POA: Diagnosis not present

## 2015-03-09 DIAGNOSIS — I83813 Varicose veins of bilateral lower extremities with pain: Secondary | ICD-10-CM | POA: Diagnosis not present

## 2015-04-01 DIAGNOSIS — I83813 Varicose veins of bilateral lower extremities with pain: Secondary | ICD-10-CM | POA: Diagnosis not present

## 2015-04-11 ENCOUNTER — Telehealth: Payer: Self-pay

## 2015-04-11 NOTE — Telephone Encounter (Signed)
Pt called back and left v/m; pt had [place] of leg; since [place] pt has a lot of pain in leg and pt having problem walking; [facility] gave Naproxen 500 mg and this is not helping pain at all. Unable to reach pt by phone; will send note to Allie Bossier NP.

## 2015-04-11 NOTE — Telephone Encounter (Signed)
Please have her call the [facility] to notify them of her discomfort as there may be something else going on. Since they are the ones addressing her symptoms, they will know which treatment will be best.

## 2015-04-11 NOTE — Telephone Encounter (Signed)
Per DPR, left detail message for patient   

## 2015-04-11 NOTE — Telephone Encounter (Signed)
Pt left v/m requesting stronger pain med for leg problem; pt saw [facility] doctor; pt had a test done there (not sure name of test) and pain pill was given; pt having problems walking since had the test and pt wants stronger pain pill. Pt did not leave name of med; left v/m requesting pt to cb.

## 2015-04-12 ENCOUNTER — Encounter (HOSPITAL_COMMUNITY): Payer: Self-pay

## 2015-04-12 ENCOUNTER — Emergency Department (HOSPITAL_COMMUNITY)
Admission: EM | Admit: 2015-04-12 | Discharge: 2015-04-12 | Disposition: A | Discharge status: Home / Self Care | Payer: Medicare Other | Attending: Emergency Medicine | Admitting: Emergency Medicine

## 2015-04-12 ENCOUNTER — Emergency Department (HOSPITAL_COMMUNITY): Payer: Medicare Other

## 2015-04-12 DIAGNOSIS — Z79899 Other long term (current) drug therapy: Secondary | ICD-10-CM | POA: Insufficient documentation

## 2015-04-12 DIAGNOSIS — Z86010 Personal history of colonic polyps: Secondary | ICD-10-CM | POA: Diagnosis not present

## 2015-04-12 DIAGNOSIS — M25562 Pain in left knee: Secondary | ICD-10-CM | POA: Diagnosis not present

## 2015-04-12 DIAGNOSIS — M25561 Pain in right knee: Secondary | ICD-10-CM | POA: Insufficient documentation

## 2015-04-12 DIAGNOSIS — M179 Osteoarthritis of knee, unspecified: Secondary | ICD-10-CM | POA: Diagnosis not present

## 2015-04-12 DIAGNOSIS — Z7982 Long term (current) use of aspirin: Secondary | ICD-10-CM | POA: Insufficient documentation

## 2015-04-12 LAB — BASIC METABOLIC PANEL
ANION GAP: 13 (ref 5–15)
BUN: 18 mg/dL (ref 6–20)
CALCIUM: 9.5 mg/dL (ref 8.9–10.3)
CO2: 26 mmol/L (ref 22–32)
CREATININE: 0.93 mg/dL (ref 0.44–1.00)
Chloride: 104 mmol/L (ref 101–111)
GFR calc Af Amer: >60 mL/min (ref >60)
GLUCOSE: 104 mg/dL — AB (ref 65–99)
Potassium: 4.1 mmol/L (ref 3.5–5.1)
Sodium: 143 mmol/L (ref 135–145)

## 2015-04-12 LAB — CBC
HCT: 46.6 % — ABNORMAL HIGH (ref 36.0–46.0)
Hemoglobin: 15.4 g/dL — ABNORMAL HIGH (ref 12.0–15.0)
MCH: 29.6 pg (ref 26.0–34.0)
MCHC: 33 g/dL (ref 30.0–36.0)
MCV: 89.6 fL (ref 78.0–100.0)
PLATELETS: 219 10*3/uL (ref 150–400)
RBC: 5.2 MIL/uL — ABNORMAL HIGH (ref 3.87–5.11)
RDW: 12.9 % (ref 11.5–15.5)
WBC: 7.6 10*3/uL (ref 4.0–10.5)

## 2015-04-12 LAB — D-DIMER, QUANTITATIVE: D-Dimer, Quant: 0.35 ug/mL-FEU (ref 0.00–0.50)

## 2015-04-12 MED ORDER — TRAMADOL HCL 50 MG PO TABS
50.0000 mg | ORAL_TABLET | Freq: Once | ORAL | Status: AC
  Administered 2015-04-12: 50 mg via ORAL
  Filled 2015-04-12: qty 1

## 2015-04-12 MED ORDER — TRAMADOL HCL 50 MG PO TABS: 50.0000 mg | ORAL_TABLET | Freq: Four times a day (QID) | ORAL | Status: DC | PRN

## 2015-04-12 NOTE — ED Notes (Signed)
Patient here with bilateral leg pain but worse is left. Has been seeing [facility] for same. Thursday developed increased pain and Sunday unable to bear weight due to severe pain of left leg.

## 2015-04-12 NOTE — ED Notes (Signed)
No answer x1

## 2015-04-12 NOTE — ED Provider Notes (Signed)
CSN: [id number]     Arrival date & time 04/12/15  1354 History   First MD Initiated Contact with Patient 04/12/15 2114     Chief Complaint  Patient presents with  . bilateral leg pain      (Consider location/radiation/quality/duration/timing/severity/associated sxs/prior Treatment) HPI Comments: Patient complains of pain to her left leg. She states that she had some problems with varicose veins. She was seen at [facility] recently and had an ultrasound of the left leg. It's unclear if this was arterial or venous but it sounds to be more of a venous ultrasound per description by the patient. She states that since ultrasound she's had increased pain in the left leg. She complains of pain from her knee down to her foot. She denies any swelling of the leg. She denies any numbness or leg. She states it's been hard to walk on it due to the pain. Today she stepped down and felt a snapping sensation around the knee area. She denies any fevers. No chest pain or shortness of breath.   Past Medical History  Diagnosis Date  . Appendicitis 8   . Polyp of colon     Benign   Past Surgical History  Procedure Laterality Date  . Abdominal hysterectomy    . Appendectomy    . Breast excisional biopsy Right     + 20 years   Family History  Problem Relation Age of Onset  . Heart attack Father 73    Deceased  . Cancer Father     Colon  . Cancer Sister     Unusual blood cancer  . Diabetes Mother     Deceased   Social History  Substance Use Topics  . Smoking status: Never Smoker   . Smokeless tobacco: Never Used  . Alcohol Use: No   OB History    No data available     Review of Systems  Constitutional: Negative for fever.  Respiratory: Negative for shortness of breath.   Cardiovascular: Negative for chest pain.  Gastrointestinal: Negative for nausea and vomiting.  Musculoskeletal: Positive for arthralgias. Negative for back pain, joint swelling and neck pain.  Skin: Negative  for rash and wound.  Neurological: Negative for weakness, numbness and headaches.  All other systems reviewed and are negative.     Allergies  Tylenol  Home Medications   Prior to Admission medications   Medication Sig Start Date End Date Taking? Authorizing Provider  aspirin 81 MG tablet Take 81 mg by mouth daily.   Yes Historical Provider, MD  calcium carbonate 200 MG capsule Take 750 mg by mouth daily.   Yes Historical Provider, MD  Cholecalciferol 5000 UNITS capsule Take 5,000 Units by mouth daily.   Yes Historical Provider, MD  Flaxseed, Linseed, (FLAXSEED OIL) 1200 MG CAPS Take 1 capsule by mouth daily.   Yes Historical Provider, MD  Misc Natural Products (OSTEO BI-FLEX TRIPLE STRENGTH) TABS Take 2 tablets by mouth.    Yes Historical Provider, MD  traZODone (DESYREL) 50 MG tablet TAKE ONE TO TWO TABLETS BY MOUTH AT BEDTIME AS NEEDED FOR SLEEP 12/28/14  Yes Pleas Koch, NP  traMADol (ULTRAM) 50 MG tablet Take 1 tablet (50 mg total) by mouth every 6 (six) hours as needed. 04/12/15   Malvin Johns, MD   BP 174/73 mmHg  Pulse 73  Temp(Src) 98.7 F (37.1 C) (Oral)  Resp 18  Ht 5\' 1"  (1.549 m)  Wt 200 lb (90.719 kg)  BMI 37.81 kg/m2  SpO2 100% Physical Exam  Constitutional: She is oriented to person, place, and time. She appears well-developed and well-nourished.  HENT:  Head: Normocephalic and atraumatic.  Neck: Normal range of motion. Neck supple.  Cardiovascular: Normal rate.   Pulmonary/Chest: Effort normal.  Musculoskeletal: She exhibits tenderness. She exhibits no edema.  Patient has tenderness diffusely around the left knee. There is no swelling. No warmth or erythema. She also has some tenderness in the posterior aspect the knee and the posterior calf. There is no unilateral leg swelling. No wounds. There is no pain along the tib-fib area. There is no bony tenderness to the foot or ankle. Pedal pulses are intact. She has normal sensation and motor function in the  leg. There is no pain on range of motion of the hip.  Neurological: She is alert and oriented to person, place, and time.  Skin: Skin is warm and dry.  Psychiatric: She has a normal mood and affect.    ED Course  Procedures (including critical care time) Labs Review Labs Reviewed  CBC - Abnormal; Notable for the following:    RBC 5.20 (*)    Hemoglobin 15.4 (*)    HCT 46.6 (*)    All other components within normal limits  BASIC METABOLIC PANEL - Abnormal; Notable for the following:    Glucose, Bld 104 (*)    All other components within normal limits  D-DIMER, QUANTITATIVE (NOT AT [facility])    Imaging Review Dg Knee Complete 4 Views Left  04/12/2015  CLINICAL DATA:  Left knee pain for 1 week.  No known injury. EXAM: LEFT KNEE - COMPLETE 4+ VIEW COMPARISON:  None. FINDINGS: There is mild degenerative narrowing of the medial compartment. Lateral compartment is relatively well preserved. No large osteophytes or other secondary signs of advanced degenerative joint disease. No osseous fracture line or displaced fracture fragment seen. No acute or suspicious osseous lesion. No appreciable joint effusion identified. Adjacent soft tissues are unremarkable, although superficial varicosities are suspected which are of uncertain clinical significance IMPRESSION: 1. No acute findings. 2. Mild degenerative narrowing of the medial compartment. 3. 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 Electronically Signed   By: Franki Cabot M.D.   On: 04/12/2015 22:16   I have personally reviewed and evaluated these images and lab results as part of my medical decision-making.   EKG Interpretation None      MDM   Final diagnoses:  Knee pain, acute, left    Patient presents with pain to her left knee and lower leg. It seems to be originating from the knee. There is no palpable effusion. No signs of infection. I don't see any leg swelling although she does have some calf  tenderness. Her d-dimer is negative I will go ahead and order a Doppler ultrasound to be performed tomorrow as an outpatient. I will not give her Lovenox given the negative d-dimer. She was given dose of tramadol which has improved her symptoms. I will give her a prescription for tramadol. I gave her referral to follow-up with orthopedics if her symptoms are not improving.    Malvin Johns, MD 04/12/15 2306

## 2015-04-12 NOTE — ED Notes (Signed)
MD at bedside. 

## 2015-04-12 NOTE — ED Notes (Signed)
Pt son to nurse desk- sts he is concerned pt has a fracture. There is no obvious deformity but pt sts that she heard a loud popping sound from L leg last night. She sts she has been unable to walk since Sunday. Denies fall.

## 2015-04-12 NOTE — Discharge Instructions (Signed)

## 2015-04-13 ENCOUNTER — Ambulatory Visit (HOSPITAL_COMMUNITY)
Admission: RE | Admit: 2015-04-13 | Discharge: 2015-04-13 | Disposition: A | Discharge status: Home / Self Care | Payer: Medicare Other | Source: Ambulatory Visit | Attending: Emergency Medicine | Admitting: Emergency Medicine

## 2015-04-13 DIAGNOSIS — M79662 Pain in left lower leg: Secondary | ICD-10-CM | POA: Diagnosis not present

## 2015-04-13 DIAGNOSIS — M7989 Other specified soft tissue disorders: Secondary | ICD-10-CM | POA: Diagnosis not present

## 2015-04-13 NOTE — Progress Notes (Signed)
VASCULAR LAB PRELIMINARY  PRELIMINARY  PRELIMINARY  PRELIMINARY  Left lower extremity venous duplex completed.    Preliminary report:  Left:  No evidence of DVT, superficial thrombosis, or Baker's cyst.  Carnie Bruemmer, RVS 04/13/2015, 9:26 AM

## 2015-04-20 DIAGNOSIS — M79605 Pain in left leg: Secondary | ICD-10-CM | POA: Diagnosis not present

## 2015-04-21 DIAGNOSIS — R269 Unspecified abnormalities of gait and mobility: Secondary | ICD-10-CM | POA: Diagnosis not present

## 2015-05-02 DIAGNOSIS — I83892 Varicose veins of left lower extremities with other complications: Secondary | ICD-10-CM | POA: Diagnosis not present

## 2015-05-02 DIAGNOSIS — I8312 Varicose veins of left lower extremity with inflammation: Secondary | ICD-10-CM | POA: Diagnosis not present

## 2015-05-04 DIAGNOSIS — I8312 Varicose veins of left lower extremity with inflammation: Secondary | ICD-10-CM | POA: Diagnosis not present

## 2015-05-08 ENCOUNTER — Other Ambulatory Visit: Payer: Self-pay | Admitting: Primary Care

## 2015-05-08 DIAGNOSIS — Z0000 Encounter for general adult medical examination without abnormal findings: Secondary | ICD-10-CM

## 2015-05-08 DIAGNOSIS — E785 Hyperlipidemia, unspecified: Secondary | ICD-10-CM

## 2015-05-09 DIAGNOSIS — M79605 Pain in left leg: Secondary | ICD-10-CM | POA: Diagnosis not present

## 2015-05-09 DIAGNOSIS — I8312 Varicose veins of left lower extremity with inflammation: Secondary | ICD-10-CM | POA: Diagnosis not present

## 2015-05-09 DIAGNOSIS — M79652 Pain in left thigh: Secondary | ICD-10-CM | POA: Diagnosis not present

## 2015-05-09 DIAGNOSIS — I83892 Varicose veins of left lower extremities with other complications: Secondary | ICD-10-CM | POA: Diagnosis not present

## 2015-05-09 DIAGNOSIS — I83812 Varicose veins of left lower extremities with pain: Secondary | ICD-10-CM | POA: Diagnosis not present

## 2015-05-10 DIAGNOSIS — M7050 Other bursitis of knee, unspecified knee: Secondary | ICD-10-CM | POA: Diagnosis not present

## 2015-05-16 ENCOUNTER — Other Ambulatory Visit (INDEPENDENT_AMBULATORY_CARE_PROVIDER_SITE_OTHER): Payer: Medicare Other

## 2015-05-16 ENCOUNTER — Telehealth: Payer: Self-pay | Admitting: *Deleted

## 2015-05-16 DIAGNOSIS — E785 Hyperlipidemia, unspecified: Secondary | ICD-10-CM | POA: Diagnosis not present

## 2015-05-16 DIAGNOSIS — Z0000 Encounter for general adult medical examination without abnormal findings: Secondary | ICD-10-CM

## 2015-05-16 LAB — CBC
HCT: 45.6 % (ref 36.0–46.0)
Hemoglobin: 15.5 g/dL — ABNORMAL HIGH (ref 12.0–15.0)
MCHC: 33.9 g/dL (ref 30.0–36.0)
MCV: 87.1 fl (ref 78.0–100.0)
Platelets: 269 10*3/uL (ref 150.0–400.0)
RBC: 5.24 Mil/uL — ABNORMAL HIGH (ref 3.87–5.11)
RDW: 13.1 % (ref 11.5–15.5)
WBC: 9.1 10*3/uL (ref 4.0–10.5)

## 2015-05-16 LAB — COMPREHENSIVE METABOLIC PANEL
ALK PHOS: 60 U/L (ref 39–117)
ALT: 16 U/L (ref 0–35)
AST: 19 U/L (ref 0–37)
Albumin: 3.9 g/dL (ref 3.5–5.2)
BILIRUBIN TOTAL: 1.2 mg/dL (ref 0.2–1.2)
BUN: 22 mg/dL (ref 6–23)
CO2: 29 mEq/L (ref 19–32)
Calcium: 9.7 mg/dL (ref 8.4–10.5)
Chloride: 104 mEq/L (ref 96–112)
Creatinine, Ser: 0.97 mg/dL (ref 0.40–1.20)
GFR: 60.21 mL/min (ref >60.00)
GLUCOSE: 83 mg/dL (ref 70–99)
Potassium: 4.2 mEq/L (ref 3.5–5.1)
SODIUM: 141 meq/L (ref 135–145)
TOTAL PROTEIN: 6.9 g/dL (ref 6.0–8.3)

## 2015-05-16 LAB — HEMOGLOBIN A1C: Hgb A1c MFr Bld: 5.6 % (ref 4.6–6.5)

## 2015-05-16 LAB — LIPID PANEL
CHOL/HDL RATIO: 4
Cholesterol: 218 mg/dL — ABNORMAL HIGH (ref 0–200)
HDL: 53.5 mg/dL (ref >39.00)
LDL Cholesterol: 140 mg/dL — ABNORMAL HIGH (ref 0–99)
NONHDL: 164.76
Triglycerides: 126 mg/dL (ref 0.0–149.0)
VLDL: 25.2 mg/dL (ref 0.0–40.0)

## 2015-05-16 LAB — VITAMIN D 25 HYDROXY (VIT D DEFICIENCY, FRACTURES): VITD: 102.43 ng/mL (ref 30.00–100.00)

## 2015-05-16 NOTE — Telephone Encounter (Signed)
Message left for patient to return my call.  

## 2015-05-16 NOTE — Telephone Encounter (Signed)
Critical lab- Vit D 102.43.

## 2015-05-16 NOTE — Telephone Encounter (Signed)
Please notify Lindsay Snyder that she's taking too much Vitamin D. She will need to stop for 2 weeks and then restart with 1000 unit capsules daily, rather than 5000 unit capsules.

## 2015-05-17 NOTE — Telephone Encounter (Signed)
Called and notified patient of Kate's comments. Patient verbalized understanding.  

## 2015-05-18 ENCOUNTER — Ambulatory Visit (INDEPENDENT_AMBULATORY_CARE_PROVIDER_SITE_OTHER): Payer: Medicare Other | Admitting: Primary Care

## 2015-05-18 ENCOUNTER — Encounter: Payer: Self-pay | Admitting: Primary Care

## 2015-05-18 VITALS — BP 122/84 | HR 82 | Temp 97.8°F | Ht 59.5 in | Wt 227.5 lb

## 2015-05-18 DIAGNOSIS — G4700 Insomnia, unspecified: Secondary | ICD-10-CM | POA: Diagnosis not present

## 2015-05-18 DIAGNOSIS — Z0000 Encounter for general adult medical examination without abnormal findings: Secondary | ICD-10-CM

## 2015-05-18 DIAGNOSIS — R011 Cardiac murmur, unspecified: Secondary | ICD-10-CM

## 2015-05-18 DIAGNOSIS — M8580 Other specified disorders of bone density and structure, unspecified site: Secondary | ICD-10-CM

## 2015-05-18 DIAGNOSIS — Z23 Encounter for immunization: Secondary | ICD-10-CM | POA: Diagnosis not present

## 2015-05-18 DIAGNOSIS — R21 Rash and other nonspecific skin eruption: Secondary | ICD-10-CM | POA: Diagnosis not present

## 2015-05-18 DIAGNOSIS — I8393 Asymptomatic varicose veins of bilateral lower extremities: Secondary | ICD-10-CM | POA: Diagnosis not present

## 2015-05-18 DIAGNOSIS — F5102 Adjustment insomnia: Secondary | ICD-10-CM

## 2015-05-18 DIAGNOSIS — E669 Obesity, unspecified: Secondary | ICD-10-CM

## 2015-05-18 MED ORDER — TRIAMCINOLONE ACETONIDE 0.1 % EX CREA: 1.0000 "application " | TOPICAL_CREAM | Freq: Two times a day (BID) | CUTANEOUS | Status: DC

## 2015-05-18 MED ORDER — TRAZODONE HCL 150 MG PO TABS: 150.0000 mg | ORAL_TABLET | Freq: Every evening | ORAL | Status: DC | PRN

## 2015-05-18 NOTE — Assessment & Plan Note (Signed)
No improvement on 100 mg of Trazodone. Increase dose to 150 mg and she is to e-mail me with an update in 1 month. May need to consider Temazepam or low dose Zolpidem if no improvement.

## 2015-05-18 NOTE — Assessment & Plan Note (Signed)
Present on exam today, stable. Echocardiogram completed 1 year ago with EF of 55-60%, aortic stenosis.

## 2015-05-18 NOTE — Assessment & Plan Note (Signed)
Currently undergoing treatment through [facility].

## 2015-05-18 NOTE — Assessment & Plan Note (Signed)
Prevnar due today. Declines Zostavax. Bone density imaging recommended, however, patient would like to defer until next year. She is working on losing weight through healthy diet. Discussed importance of regular exercise. She has a dental exam scheduled and is up to date on her eye exam. Cholesterol slightly above goal which will likely lower with continued weight loss. Will continue to monitor. Mammogram UTD.  I have personally reviewed and have noted: 1. The patient's medical and social history 2. Their use of alcohol, tobacco or illicit drugs 3. Their current medications and supplements 4. The patient's functional ability including ADL's, fall risks, home safety risks and  hearing or visual impairment. 5. Diet and physical activities 6. Evidence for depression or mood disorder  Follow up in 1 year for repeat MWV.

## 2015-05-18 NOTE — Progress Notes (Signed)
Patient ID: Lindsay Snyder, female   DOB: 09-02-1945, 70 y.o.   MRN: [id number]  HPI: Lindsay Snyder is a 70 year old female who presents today for her annual wellness exam.  Past Medical History  Diagnosis Date  . Appendicitis 8   . Polyp of colon     Benign    Current Outpatient Prescriptions  Medication Sig Dispense Refill  . ALPHA LIPOIC ACID PO Take 1 capsule by mouth 2 (two) times daily.    Marland Kitchen aspirin 81 MG tablet Take 81 mg by mouth daily.    . calcium carbonate 200 MG capsule Take 750 mg by mouth daily.    . Cholecalciferol 5000 UNITS capsule Take 5,000 Units by mouth daily.    . Cyanocobalamin (VITAMIN B 12 PO) Take by mouth. Take 1 tablet two to three times daily    . etodolac (LODINE) 500 MG tablet Take 1 tablet by mouth 2 (two) times daily.    . Flaxseed, Linseed, (FLAXSEED OIL) 1200 MG CAPS Take 1 capsule by mouth daily.    . Magnesium 200 MG TABS Take 1 tablet by mouth 2 (two) times daily.    . Misc Natural Products (OSTEO BI-FLEX TRIPLE STRENGTH) TABS Take 2 tablets by mouth.     . traMADol (ULTRAM) 50 MG tablet Take 1 tablet (50 mg total) by mouth every 6 (six) hours as needed. 15 tablet 0  . traZODone (DESYREL) 50 MG tablet TAKE ONE TO TWO TABLETS BY MOUTH AT BEDTIME AS NEEDED FOR SLEEP 60 tablet 5   No current facility-administered medications for this visit.    Allergies  Allergen Reactions  . Tylenol [Acetaminophen] Rash    Family History  Problem Relation Age of Onset  . Heart attack Father 23    Deceased  . Cancer Father     Colon  . Cancer Sister     Unusual blood cancer  . Diabetes Mother     Deceased    Social History   Social History  . Marital Status: Married    Spouse Name: N/A  . Number of Children: N/A  . Years of Education: N/A   Occupational History  . Not on file.   Social History Main Topics  . Smoking status: Never Smoker   . Smokeless tobacco: Never Used  . Alcohol Use: No  . Drug Use: No  . Sexual Activity: Not on file    Other Topics Concern  . Not on file   Social History Narrative   From [place], moved to [place] for her ill daughter, moved to [place], moved back to [place].   Married. Retired.   Lives in [place].   Enjoys babysitting, writing, reading, cooking.          Hospitiliaztions: ED visit in March 2017 for lower extremity pain. No admission. She continues to experience pain to lower extremities. She is managed on Alpha Lipoic Acid and Magnesium 200 mg for which she was once managed on in [place]. She did follow up with [facility] as recommended from the ED visit and had a bad experience. She didn't feel as though they helped. 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 She's also undergone laser surgery in [facility] for her veins.   Health Maintenance:    Flu: Did not complete last summer  Tetanus: Completed in April 2016  Pneumovax: Completed in July 2014  Prevnar:   Zostavax: Declines  Bone Density: Completed in August  2014, due in 2018.  Colonoscopy: Completed in 2014, due in November 2019  Eye Doctor: She completed this in April 2016.  Dental Exam: Has an appointment in early May 2017.  Mammogram: Completed in May 2016, normal      Providers: Elenore Rota Covert, O.D- Optometrist, Alma Friendly, PCP.   I have personally reviewed and have noted: 1. The patient's medical and social history 2. Their use of alcohol, tobacco or illicit drugs 3. Their current medications and supplements 4. The patient's functional ability including ADL's, fall risks, home safety risks and  hearing or visual impairment. 5. Diet and physical activities 6. Evidence for depression or mood disorder  Subjective:   Review of Systems:   Constitutional: Denies fever, malaise, fatigue, headache or abrupt weight changes.  HEENT: Denies eye pain, eye redness, ear pain, ringing in the ears, wax buildup, runny nose, nasal congestion, bloody  nose, or sore throat. Respiratory: Denies difficulty breathing, shortness of breath, cough or sputum production.   Cardiovascular: Denies chest pain, chest tightness, palpitations or swelling in the hands or feet. She is undergoing vein work.  Gastrointestinal: Denies abdominal pain, bloating, constipation, diarrhea or blood in the stool.  GU: Denies urgency, frequency, pain with urination, burning sensation, blood in urine, odor or discharge. Musculoskeletal: Denies decrease in range of motion, difficulty with gait, muscle pain. She does have pain to her left knee and is due for re-evaluation at [facility]. She will experience occasional pain to her left knee that will cause great discomfort.  Skin: Denies redness, rashes, lesions or ulcercations. Occasional eczema, none recently. Neurological: Denies dizziness, difficulty with memory, difficulty with speech or problems with balance and coordination.   1) Insomnia: Difficulty sleeping for years. Currently managed on Trazodone 50. She has increased family and personal stress with both children. She took 100 mg of the Trazodone for several days but stopped as this did not help. She's tried EmergenZ, natural substances, benadryl, melatonin without improvement. She has difficulty falling asleep and will wake up during the night.   No other specific complaints in a complete review of systems (except as listed in HPI above).  Objective:  PE:   BP 122/84 mmHg  Pulse 82  Temp(Src) 97.8 F (36.6 C) (Oral)  Ht 4' 11.5" (1.511 m)  Wt 227 lb 8 oz (103.193 kg)  BMI 45.20 kg/m2  SpO2 97% Wt Readings from Last 3 Encounters:  05/18/15 227 lb 8 oz (103.193 kg)  04/12/15 200 lb (90.719 kg)  11/11/14 234 lb 12.8 oz (106.505 kg)    General: Appears their stated age, well developed, well nourished in NAD. Skin: Warm, dry and intact. No rashes, lesions or ulcerations noted. HEENT: Head: normal shape and size; Eyes: sclera white, no icterus, conjunctiva pink,  PERRLA and EOMs intact; Ears: Tm's gray and intact, normal light reflex; Nose: mucosa pink and moist, septum midline; Throat/Mouth: Teeth present, mucosa pink and moist, no exudate, lesions or ulcerations noted.  Neck: Normal range of motion. Neck supple, trachea midline. No massses, lumps or thyromegaly present.  Cardiovascular: Normal rate and rhythm. S1,S2 noted.  Aortic murmur present (echo performed last year, stable).  No JVD or BLE edema. No carotid bruits noted. Pulmonary/Chest: Normal effort and positive vesicular breath sounds. No respiratory distress. No wheezes, rales or ronchi noted.  Abdomen: Soft and nontender. Normal bowel sounds, no bruits noted. No distention or masses noted. Liver, spleen and kidneys non palpable. Musculoskeletal: Decreased range of motion to left knee, currently following with orthopedist. No signs  of joint swelling. Mild limp with gait.  Neurological: Alert and oriented. Cranial nerves II-XII intact. Coordination normal. +DTRs bilaterally. Psychiatric: Mood and affect normal. Behavior is normal. Judgment and thought content normal.    BMET    Component Value Date/Time   NA 141 05/16/2015 0943   K 4.2 05/16/2015 0943   CL 104 05/16/2015 0943   CO2 29 05/16/2015 0943   GLUCOSE 83 05/16/2015 0943   BUN 22 05/16/2015 0943   CREATININE 0.97 05/16/2015 0943   CALCIUM 9.7 05/16/2015 0943   GFRNONAA >60 04/12/2015 1452   GFRAA >60 04/12/2015 1452    Lipid Panel     Component Value Date/Time   CHOL 218* 05/16/2015 0943   TRIG 126.0 05/16/2015 0943   HDL 53.50 05/16/2015 0943   CHOLHDL 4 05/16/2015 0943   VLDL 25.2 05/16/2015 0943   LDLCALC 140* 05/16/2015 0943    CBC    Component Value Date/Time   WBC 9.1 05/16/2015 0943   RBC 5.24* 05/16/2015 0943   HGB 15.5* 05/16/2015 0943   HCT 45.6 05/16/2015 0943   PLT 269.0 05/16/2015 0943   MCV 87.1 05/16/2015 0943   MCH 29.6 04/12/2015 1452   MCHC 33.9 05/16/2015 0943   RDW 13.1 05/16/2015 0943    LYMPHSABS 1.4 05/17/2014 0947   MONOABS 0.5 05/17/2014 0947   EOSABS 0.1 05/17/2014 0947   BASOSABS 0.0 05/17/2014 0947    Hgb A1C Lab Results  Component Value Date   HGBA1C 5.6 05/16/2015      Assessment and Plan:   Medicare Annual Wellness Visit:  Diet: Endorses a healthy diet: Breakfast: Whole wheat bread with peanut butter, fruit Lunch: Cereal Dinner: Home cooked meals, eats lighter Snack: Granola bar Desserts: Rarely Beverages: Water, tea, V8 Physical activity: She does not currently exercise.  Depression/mood screen: Negative Hearing: Intact to whispered voice Visual acuity: Grossly normal, performs annual eye exam  ADLs: Capable Fall risk: None Home safety: Good Cognitive evaluation: Intact to orientation, naming, recall and repetition EOL planning: Adv directives, full code/ I agree  Preventative Medicine: Prevnar due today. Declines Zostavax. Bone density imaging recommended, however, patient would like to defer until next year. She is working on losing weight through healthy diet. Discussed importance of regular exercise. She has a dental exam scheduled and is up to date on her eye exam. Cholesterol slightly above goal which will likely lower with continued weight loss. Will continue to monitor. Mammogram UTD.  Next appointment: 1 year for repeat MWV.

## 2015-05-18 NOTE — Assessment & Plan Note (Signed)
Gradual weight loss over the past 1 year through healthy diet. Discussed importance of regular exercise. Will continue to monitor. Commended her on this success!

## 2015-05-18 NOTE — Assessment & Plan Note (Signed)
Vitamin D levels too high. Reduced down to 1000 unit capsules. Will re-check in 3 months.

## 2015-05-18 NOTE — Assessment & Plan Note (Signed)
Intermittent. Refill of triamcinolone provided.

## 2015-05-18 NOTE — Patient Instructions (Signed)
You may take Trazodone 150 mg tablets as needed for difficulty sleeping. Take this 1 hour prior to bedtime.  Please notify your Orthopedic doctor about your pain. He may have better suggestions.   Please e-mail me in 1 month regarding your sleep patterns.   Your cholesterol is slightly above goal today. Continue to work on weight loss through healthy diet as discussed.  Re-start Vitamin D 1000 unit capsules in 1 week.   Schedule a lab only appointment in 3 months for re-evaluation of your vitamin D levels.  Follow up in 1 year for your annual wellness exam.  It was a pleasure to see you today!

## 2015-05-18 NOTE — Progress Notes (Signed)
Pre visit review using our clinic review tool, if applicable. No additional management support is needed unless otherwise documented below in the visit note. 

## 2015-05-26 ENCOUNTER — Other Ambulatory Visit: Payer: Self-pay | Admitting: Primary Care

## 2015-05-26 ENCOUNTER — Telehealth: Payer: Self-pay

## 2015-05-26 NOTE — Telephone Encounter (Signed)
Please notify Lindsay Snyder that I would like for her to consider Lunesta as she has difficulty falling and staying asleep. This medication will help with both. Please have think about this an notify me if she'd like to try.

## 2015-05-26 NOTE — Telephone Encounter (Signed)
Pt left v/m; pt was seen 05/18/15 and has been taking trazodone 150 mg and not helping pt to sleep. Pt request different med for sleep. Pt request cb.

## 2015-05-27 MED ORDER — ZOLPIDEM TARTRATE 5 MG PO TABS
5.0000 mg | ORAL_TABLET | Freq: Every evening | ORAL | Status: DC | PRN
Start: 2015-05-27 — End: 2015-09-22

## 2015-05-27 NOTE — Telephone Encounter (Signed)
Message left for patient to return my call.  

## 2015-05-27 NOTE — Telephone Encounter (Signed)
Patient called back and stated that Lindsay Snyder is not cover under her insurance. So she called [facility] and it was over $200 on the lowest dosage they have. So patient wanted to know what else can she try. Please advise.

## 2015-05-27 NOTE — Telephone Encounter (Signed)
Patient said "that will be fine".  Patient uses [facility]Marland Kitchen

## 2015-05-27 NOTE — Telephone Encounter (Signed)
We can try Ambien. Please call in Ambien 5 mg tablets. Take 1 tablet by mouth every night as needed for insomnia. #30 no refills. Please have Ms. Barkan update [place] if too expensive or does not work.

## 2015-05-27 NOTE — Telephone Encounter (Signed)
Patient returned Chan's call. °

## 2015-05-27 NOTE — Telephone Encounter (Signed)
Called in Ambien to [facility]

## 2015-06-16 ENCOUNTER — Telehealth: Payer: Self-pay | Admitting: Primary Care

## 2015-06-16 DIAGNOSIS — G4700 Insomnia, unspecified: Secondary | ICD-10-CM

## 2015-06-16 NOTE — Telephone Encounter (Signed)
-----   Message from Pleas Koch, NP sent at 05/18/2015  9:00 AM EDT ----- Regarding: Insomnia We initiated her on Trazodone 150 mg for insomnia. How's she doing with sleep?

## 2015-06-17 NOTE — Telephone Encounter (Signed)
Message left for patient to return my call.  

## 2015-06-21 ENCOUNTER — Other Ambulatory Visit: Payer: Self-pay | Admitting: Primary Care

## 2015-06-21 MED ORDER — TRAZODONE HCL 150 MG PO TABS: 150.0000 mg | ORAL_TABLET | Freq: Every evening | ORAL | Status: DC | PRN

## 2015-06-21 NOTE — Telephone Encounter (Signed)
Spoken to patient and stated that the Trazodone is working for her. She is getting much needed sleep. Also patient would like a refill of Trazodone. She have a few days left but don't want to run out.

## 2015-06-21 NOTE — Telephone Encounter (Signed)
Noted.  Refill sent to pharmacy. 

## 2015-06-21 NOTE — Telephone Encounter (Signed)
Message left for patient to return my call.  

## 2015-06-28 DIAGNOSIS — H524 Presbyopia: Secondary | ICD-10-CM | POA: Diagnosis not present

## 2015-07-20 ENCOUNTER — Telehealth: Payer: Self-pay

## 2015-07-20 DIAGNOSIS — Z1211 Encounter for screening for malignant neoplasm of colon: Secondary | ICD-10-CM

## 2015-07-20 NOTE — Telephone Encounter (Signed)
IFOB sounds good, please order and set her up.

## 2015-07-20 NOTE — Telephone Encounter (Signed)
Pt left v/m; pt is filling out [facility] plan and pt gets points for sending in proof of certain test; pt is not due for colonoscopy for 2 years and pt wants to get CA screening Kit;  ? Col guard or ifob. Pt request cb.

## 2015-07-20 NOTE — Telephone Encounter (Deleted)
Per DPR

## 2015-07-20 NOTE — Telephone Encounter (Signed)
Per DPR, left detail message for patient to come and pick up IFOB cards in the office.

## 2015-07-28 ENCOUNTER — Other Ambulatory Visit (INDEPENDENT_AMBULATORY_CARE_PROVIDER_SITE_OTHER): Payer: Medicare Other

## 2015-07-28 DIAGNOSIS — Z1211 Encounter for screening for malignant neoplasm of colon: Secondary | ICD-10-CM | POA: Diagnosis not present

## 2015-07-28 LAB — FECAL OCCULT BLOOD, IMMUNOCHEMICAL: Fecal Occult Bld: NEGATIVE

## 2015-08-08 ENCOUNTER — Other Ambulatory Visit (INDEPENDENT_AMBULATORY_CARE_PROVIDER_SITE_OTHER): Payer: Medicare Other

## 2015-08-08 DIAGNOSIS — E673 Hypervitaminosis D: Secondary | ICD-10-CM | POA: Diagnosis not present

## 2015-08-08 LAB — VITAMIN D 25 HYDROXY (VIT D DEFICIENCY, FRACTURES): VITD: 87.59 ng/mL (ref 30.00–100.00)

## 2015-08-12 DIAGNOSIS — I8312 Varicose veins of left lower extremity with inflammation: Secondary | ICD-10-CM | POA: Diagnosis not present

## 2015-08-12 DIAGNOSIS — I83812 Varicose veins of left lower extremities with pain: Secondary | ICD-10-CM | POA: Diagnosis not present

## 2015-09-05 DIAGNOSIS — I83812 Varicose veins of left lower extremities with pain: Secondary | ICD-10-CM | POA: Diagnosis not present

## 2015-09-05 DIAGNOSIS — I8312 Varicose veins of left lower extremity with inflammation: Secondary | ICD-10-CM | POA: Diagnosis not present

## 2015-09-22 ENCOUNTER — Ambulatory Visit (INDEPENDENT_AMBULATORY_CARE_PROVIDER_SITE_OTHER): Payer: Medicare Other | Admitting: Primary Care

## 2015-09-22 ENCOUNTER — Encounter: Payer: Self-pay | Admitting: Primary Care

## 2015-09-22 VITALS — BP 124/74 | HR 106 | Temp 97.7°F | Ht 59.5 in | Wt 221.0 lb

## 2015-09-22 DIAGNOSIS — M62830 Muscle spasm of back: Secondary | ICD-10-CM

## 2015-09-22 MED ORDER — TIZANIDINE HCL 2 MG PO TABS: 2.0000 mg | ORAL_TABLET | Freq: Three times a day (TID) | ORAL | 0 refills | Status: DC | PRN

## 2015-09-22 MED ORDER — TRAMADOL HCL 50 MG PO TABS: 50.0000 mg | ORAL_TABLET | Freq: Three times a day (TID) | ORAL | 0 refills | Status: DC | PRN

## 2015-09-22 NOTE — Patient Instructions (Signed)
Your symptoms are related to an aggravated muscle that is causing pain.   You may take Tramadol 50 mg tablets as needed for pain. Take 1 tablet by mouth every 8 hours as needed for pain.  You may take Tizanidine 2 mg tablets as needed for muscle spasm. Take 1 tablet by mouth every 8 hours as needed for muscle spasm.  Continue application of heat. Apply a heating pad three times daily for 20 minute intervals.  Refrain from using the stairs as this will likely aggravate your muscles.   Refrain from laying stagnant as this will cause increased stiffness/discomfort.  Please call me if no improvement in 1-2 weeks.  It was a pleasure to see you today!

## 2015-09-22 NOTE — Progress Notes (Signed)
Subjective:    Patient ID: Lindsay Snyder, female    DOB: 05/04/1945, 70 y.o.   MRN: [id number]  HPI  Lindsay Snyder is a 70 year old female who presents today with a chief complaint of back pain. She also reports lower extremity pain. She has been helping to care for her daughter who recently underwent back surgery. She has been walking up and down her stairs frequently which has been very difficult on her body. She typically doesn't walk up and down her stairs regularly.  She was cleaning her daughters room two days ago, bent forward, and experienced sudden right lower back pain with radiation to her right lower extremity. She has noticed weakness to her right lower extremity due to pain as she could not raise her leg to get into her vehicle today, which is an SUV. She describes her pain as sharp, tightness that is intermittent. She can walk without much difficulty. She's taken Tramadol from an older prescription with improvement.  Denies numbness/tingling, prior history of back problems.   2) Bruising: Numerous brusing to lower extremities. She underwent vein procedure in early August and has noticed bruising since. She has been lifting and exerting herself more so than usual as she is caring for her daughter that has had recent back surgery. Denies fatigue. CBC in April stable.  Review of Systems  Constitutional: Negative for chills and fatigue.  Musculoskeletal: Positive for back pain and myalgias. Negative for joint swelling.  Skin:       Bruising   Neurological: Positive for weakness.       Past Medical History:  Diagnosis Date  . Appendicitis 8   . Polyp of colon    Benign     Social History   Social History  . Marital status: Married    Spouse name: N/A  . Number of children: N/A  . Years of education: N/A   Occupational History  . Not on file.   Social History Main Topics  . Smoking status: Never Smoker  . Smokeless tobacco: Never Used  . Alcohol use No  . Drug  use: No  . Sexual activity: Not on file   Other Topics Concern  . Not on file   Social History Narrative   From [place], moved to [place] for her ill daughter, moved to [place], moved back to [place].   Married. Retired.   Lives in [place].   Enjoys babysitting, writing, reading, cooking.          Past Surgical History:  Procedure Laterality Date  . ABDOMINAL HYSTERECTOMY    . APPENDECTOMY    . BREAST EXCISIONAL BIOPSY Right    + 20 years    Family History  Problem Relation Age of Onset  . Heart attack Father 50    Deceased  . Cancer Father     Colon  . Cancer Sister     Unusual blood cancer  . Diabetes Mother     Deceased    Allergies  Allergen Reactions  . Tylenol [Acetaminophen] Rash    Current Outpatient Prescriptions on File Prior to Visit  Medication Sig Dispense Refill  . ALPHA LIPOIC ACID PO Take 1 capsule by mouth 2 (two) times daily.    Marland Kitchen aspirin 81 MG tablet Take 81 mg by mouth daily.    . calcium carbonate 200 MG capsule Take 750 mg by mouth daily.    . Cholecalciferol 5000 UNITS capsule Take 5,000 Units by mouth daily.    Marland Kitchen  Cyanocobalamin (VITAMIN B 12 PO) Take by mouth. Take 1 tablet two to three times daily    . Flaxseed, Linseed, (FLAXSEED OIL) 1200 MG CAPS Take 1 capsule by mouth daily.    . Magnesium 200 MG TABS Take 1 tablet by mouth 2 (two) times daily.    . Misc Natural Products (OSTEO BI-FLEX TRIPLE STRENGTH) TABS Take 2 tablets by mouth.     . traZODone (DESYREL) 150 MG tablet Take 1 tablet (150 mg total) by mouth at bedtime as needed for sleep. 90 tablet 1  . triamcinolone cream (KENALOG) 0.1 % Apply 1 application topically 2 (two) times daily. 30 g 1   No current facility-administered medications on file prior to visit.     BP 124/74   Pulse (!) 106   Temp 97.7 F (36.5 C) (Oral)   Ht 4' 11.5" (1.511 m)   Wt 221 lb (100.2 kg)   SpO2 98%   BMI 43.89 kg/m    Objective:   Physical Exam  Constitutional: She appears well-nourished.    Neck: Neck supple.  Cardiovascular: Normal rate and regular rhythm.   Pulmonary/Chest: Effort normal and breath sounds normal.  Musculoskeletal:       Arms: Decreased ROM to right lower extremity with flexion due to pain of right lower back. Good strength bilaterally. Could not get on exam table. No spinal tenderness.  Skin: Skin is warm and dry.  Multiple healing bruises to bilateral lower extremities.          Assessment & Plan:  Muscle Strain/Spasm:  Pain to right lower back x 2 days after bending forward. Suspect aggravation due to increased physical activity while caring for her daughter who's recently undergone back surgery. Exam today without tenderness up palpation, but decrease in ROM. Good strength to bilateral lower extremities. Suspect spasm/strain. Rx for short term Tramadol and Tizanidine provided. Discussed application of heat, prevention of stiffness by walking. Refrain from using stairs in home if possible. Follow up PRN.  Sheral Flow, NP

## 2015-09-22 NOTE — Progress Notes (Signed)
Pre visit review using our clinic review tool, if applicable. No additional management support is needed unless otherwise documented below in the visit note. 

## 2015-10-24 DIAGNOSIS — M7981 Nontraumatic hematoma of soft tissue: Secondary | ICD-10-CM | POA: Diagnosis not present

## 2015-10-24 DIAGNOSIS — M79652 Pain in left thigh: Secondary | ICD-10-CM | POA: Diagnosis not present

## 2015-10-24 DIAGNOSIS — M79605 Pain in left leg: Secondary | ICD-10-CM | POA: Diagnosis not present

## 2015-10-24 DIAGNOSIS — I8312 Varicose veins of left lower extremity with inflammation: Secondary | ICD-10-CM | POA: Diagnosis not present

## 2015-11-14 ENCOUNTER — Ambulatory Visit (INDEPENDENT_AMBULATORY_CARE_PROVIDER_SITE_OTHER): Payer: Medicare Other | Admitting: Primary Care

## 2015-11-14 ENCOUNTER — Encounter: Payer: Self-pay | Admitting: Primary Care

## 2015-11-14 VITALS — BP 124/82 | HR 83 | Temp 97.7°F | Ht 59.5 in | Wt 222.8 lb

## 2015-11-14 DIAGNOSIS — Z7189 Other specified counseling: Secondary | ICD-10-CM

## 2015-11-14 NOTE — Patient Instructions (Signed)
Here are a few questions to as the Vein Doctor:  Will it be physically harmful for me to discontinue these procedures suddenly? Will I be at risk for a blood clot if we do not continue to drain the current veins? Why haven't I noticed any relief in the pain to my legs? How many more sessions will I require to continue? How many more new veins need to be treated?  Please don't hesitate to e-mail me with any more questions/concerns. Keep me updated! It was a pleasure to see you today!

## 2015-11-14 NOTE — Progress Notes (Signed)
Subjective:    Patient ID: Lindsay Snyder, female    DOB: [birth year], 70 y.o.   MRN: [id number]  HPI  Lindsay Snyder is a 70 year old female with a history of varicose veins who presents today to discuss varicose veins. She has been following with [facility] for the past 6 months for surgical and non surgical intervention of varicose veins to her left lower extremity. She's had surgical intervention of a vein to the left upper portion of her left lower extremity. She did very well during this intervention.   She is frustrated as she was unaware that she would be receiving ongoing treatment from her initial surgical intervention, plus treatment for other varicose veins. She's not noticed improvement in her left lower extremity pain since her initial surgery. Each of her follow up non surgical interventions have been quite painful. She doesn't feel as though her questions are being addressed during her visits.   Review of Systems  Respiratory: Negative for shortness of breath.   Cardiovascular: Negative for chest pain and leg swelling.  Musculoskeletal:       Left lower extremity pain  Skin: Negative for wound.       Past Medical History:  Diagnosis Date  . Appendicitis 8   . Polyp of colon    Benign     Social History   Social History  . Marital status: Married    Spouse name: N/A  . Number of children: N/A  . Years of education: N/A   Occupational History  . Not on file.   Social History Main Topics  . Smoking status: Never Smoker  . Smokeless tobacco: Never Used  . Alcohol use No  . Drug use: No  . Sexual activity: Not on file   Other Topics Concern  . Not on file   Social History Narrative   From [place], moved to [place] for her ill daughter, moved to [place], moved back to [place].   Married. Retired.   Lives in [place].   Enjoys babysitting, writing, reading, cooking.          Past Surgical History:  Procedure Laterality Date  . ABDOMINAL  HYSTERECTOMY    . APPENDECTOMY    . BREAST EXCISIONAL BIOPSY Right    + 20 years    Family History  Problem Relation Age of Onset  . Heart attack Father 73    Deceased  . Cancer Father     Colon  . Cancer Sister     Unusual blood cancer  . Diabetes Mother     Deceased    Allergies  Allergen Reactions  . Tylenol [Acetaminophen] Rash    Current Outpatient Prescriptions on File Prior to Visit  Medication Sig Dispense Refill  . ALPHA LIPOIC ACID PO Take 1 capsule by mouth 2 (two) times daily.    Marland Kitchen aspirin 81 MG tablet Take 81 mg by mouth daily.    . Magnesium 200 MG TABS Take 1 tablet by mouth 2 (two) times daily.    . Misc Natural Products (OSTEO BI-FLEX TRIPLE STRENGTH) TABS Take 2 tablets by mouth.     . traZODone (DESYREL) 150 MG tablet Take 1 tablet (150 mg total) by mouth at bedtime as needed for sleep. 90 tablet 1   No current facility-administered medications on file prior to visit.     BP 124/82   Pulse 83   Temp 97.7 F (36.5 C) (Oral)   Ht 4' 11.5" (1.511 m)  Wt 222 lb 12.8 oz (101.1 kg)   SpO2 97%   BMI 44.25 kg/m    Objective:   Physical Exam  Constitutional: She appears well-nourished.  Cardiovascular: Normal rate.   Pulmonary/Chest: Effort normal.  Skin: Skin is warm and dry.  Mild, well healing bruising to prior vein interventions. Hematomas present subcutaneously.           Assessment & Plan:  Varicose Veins:  Long discussion today regarding her goals for varicose vein treatment. Strongly recommended she continue treatments if there is risk for clotting to treated veins.  Strongly recommended she follow up this week as scheduled to discuss her concerns. Provided her with a list of questions to ask regarding the treatment plan. No evidence today of clotting. Exam unremarkable.  Sheral Flow, NP

## 2015-11-14 NOTE — Progress Notes (Signed)
Pre visit review using our clinic review tool, if applicable. No additional management support is needed unless otherwise documented below in the visit note. 

## 2016-01-02 ENCOUNTER — Other Ambulatory Visit: Payer: Self-pay | Admitting: Primary Care

## 2016-01-02 DIAGNOSIS — Z1231 Encounter for screening mammogram for malignant neoplasm of breast: Secondary | ICD-10-CM

## 2016-02-02 DIAGNOSIS — D492 Neoplasm of unspecified behavior of bone, soft tissue, and skin: Secondary | ICD-10-CM | POA: Diagnosis not present

## 2016-02-02 DIAGNOSIS — L820 Inflamed seborrheic keratosis: Secondary | ICD-10-CM | POA: Diagnosis not present

## 2016-02-02 DIAGNOSIS — D485 Neoplasm of uncertain behavior of skin: Secondary | ICD-10-CM | POA: Diagnosis not present

## 2016-02-02 DIAGNOSIS — D225 Melanocytic nevi of trunk: Secondary | ICD-10-CM | POA: Diagnosis not present

## 2016-02-02 DIAGNOSIS — L821 Other seborrheic keratosis: Secondary | ICD-10-CM | POA: Diagnosis not present

## 2016-02-02 DIAGNOSIS — L309 Dermatitis, unspecified: Secondary | ICD-10-CM | POA: Diagnosis not present

## 2016-02-02 DIAGNOSIS — D229 Melanocytic nevi, unspecified: Secondary | ICD-10-CM | POA: Diagnosis not present

## 2016-02-08 ENCOUNTER — Ambulatory Visit
Admission: RE | Admit: 2016-02-08 | Discharge: 2016-02-08 | Disposition: A | Discharge status: Home / Self Care | Payer: Medicare Other | Source: Ambulatory Visit | Attending: Primary Care | Admitting: Primary Care

## 2016-02-08 DIAGNOSIS — Z1231 Encounter for screening mammogram for malignant neoplasm of breast: Secondary | ICD-10-CM | POA: Diagnosis not present

## 2016-03-13 ENCOUNTER — Other Ambulatory Visit: Payer: Self-pay

## 2016-03-13 DIAGNOSIS — G4700 Insomnia, unspecified: Secondary | ICD-10-CM

## 2016-03-13 NOTE — Telephone Encounter (Signed)
Pt left v/m requesting refill trazodone to [facility]. Pt last seen 11/14/15. rx last refilled # 90 x 1 on 06/21/15.

## 2016-03-14 MED ORDER — TRAZODONE HCL 150 MG PO TABS
150.0000 mg | ORAL_TABLET | Freq: Every evening | ORAL | 1 refills | Status: DC | PRN
Start: 2016-03-14 — End: 2016-10-22

## 2016-06-27 ENCOUNTER — Other Ambulatory Visit: Payer: Self-pay | Admitting: Primary Care

## 2016-06-27 DIAGNOSIS — E559 Vitamin D deficiency, unspecified: Secondary | ICD-10-CM

## 2016-06-27 DIAGNOSIS — E785 Hyperlipidemia, unspecified: Secondary | ICD-10-CM

## 2016-06-29 ENCOUNTER — Ambulatory Visit (INDEPENDENT_AMBULATORY_CARE_PROVIDER_SITE_OTHER): Payer: Medicare Other

## 2016-06-29 VITALS — BP 120/80 | HR 59 | Temp 97.9°F | Ht 59.5 in | Wt 224.5 lb

## 2016-06-29 DIAGNOSIS — E559 Vitamin D deficiency, unspecified: Secondary | ICD-10-CM | POA: Diagnosis not present

## 2016-06-29 DIAGNOSIS — Z1159 Encounter for screening for other viral diseases: Secondary | ICD-10-CM | POA: Diagnosis not present

## 2016-06-29 DIAGNOSIS — Z0000 Encounter for general adult medical examination without abnormal findings: Secondary | ICD-10-CM

## 2016-06-29 DIAGNOSIS — E785 Hyperlipidemia, unspecified: Secondary | ICD-10-CM

## 2016-06-29 LAB — COMPREHENSIVE METABOLIC PANEL
ALK PHOS: 62 U/L (ref 39–117)
ALT: 12 U/L (ref 0–35)
AST: 17 U/L (ref 0–37)
Albumin: 4.1 g/dL (ref 3.5–5.2)
BUN: 22 mg/dL (ref 6–23)
CALCIUM: 9.6 mg/dL (ref 8.4–10.5)
CO2: 30 meq/L (ref 19–32)
CREATININE: 0.95 mg/dL (ref 0.40–1.20)
Chloride: 105 mEq/L (ref 96–112)
GFR: 61.48 mL/min (ref >60.00)
GLUCOSE: 89 mg/dL (ref 70–99)
Potassium: 4.4 mEq/L (ref 3.5–5.1)
Sodium: 138 mEq/L (ref 135–145)
TOTAL PROTEIN: 6.9 g/dL (ref 6.0–8.3)
Total Bilirubin: 0.7 mg/dL (ref 0.2–1.2)

## 2016-06-29 LAB — LIPID PANEL
CHOL/HDL RATIO: 5
Cholesterol: 239 mg/dL — ABNORMAL HIGH (ref 0–200)
HDL: 52.3 mg/dL (ref >39.00)
LDL CALC: 156 mg/dL — AB (ref 0–99)
NonHDL: 187.19
TRIGLYCERIDES: 155 mg/dL — AB (ref 0.0–149.0)
VLDL: 31 mg/dL (ref 0.0–40.0)

## 2016-06-29 LAB — HEMOGLOBIN A1C: Hgb A1c MFr Bld: 5.4 % (ref 4.6–6.5)

## 2016-06-29 LAB — VITAMIN D 25 HYDROXY (VIT D DEFICIENCY, FRACTURES): VITD: 61.39 ng/mL (ref 30.00–100.00)

## 2016-06-29 NOTE — Progress Notes (Signed)
PCP notes:   Health maintenance:  Hep C screening - completed Colonoscopy - per pt,completed in 2014  Abnormal screenings:   None  Patient concerns:   None  Nurse concerns:  Hearing screen - pt will complete screening at CPE  Next PCP appt:   07/10/16 @ 1145

## 2016-06-29 NOTE — Progress Notes (Signed)
I reviewed health advisor's note, was available for consultation, and agree with documentation and plan.  

## 2016-06-29 NOTE — Progress Notes (Signed)
Subjective:   Lindsay Snyder is a 71 y.o. female who presents for Medicare Annual (Subsequent) preventive examination.  Review of Systems:  N/A Cardiac Risk Factors include: advanced age (>31men, >9 women);obesity (BMI >30kg/m2)     Objective:     Vitals: BP 120/80 (BP Location: Left Arm, Patient Position: Sitting, Cuff Size: Large)   Pulse (!) 59   Temp 97.9 F (36.6 C) (Oral)   Ht 4' 11.5" (1.511 m) Comment: no shoes  Wt 224 lb 8 oz (101.8 kg)   SpO2 96%   BMI 44.58 kg/m   Body mass index is 44.58 kg/m.   Tobacco History  Smoking Status  . Never Smoker  Smokeless Tobacco  . Never Used     Counseling given: No   Past Medical History:  Diagnosis Date  . Appendicitis 8   . Polyp of colon    Benign   Past Surgical History:  Procedure Laterality Date  . ABDOMINAL HYSTERECTOMY    . APPENDECTOMY    . BREAST EXCISIONAL BIOPSY Right    + 20 years   Family History  Problem Relation Age of Onset  . Diabetes Mother        Deceased  . Heart attack Father 99       Deceased  . Cancer Father        Colon  . Cancer Sister        Unusual blood cancer  . Breast cancer Paternal Aunt    History  Sexual Activity  . Sexual activity: Not on file    Outpatient Encounter Prescriptions as of 06/29/2016  Medication Sig  . ALPHA LIPOIC ACID PO Take 1 capsule by mouth 2 (two) times daily.  Marland Kitchen aspirin 81 MG tablet Take 81 mg by mouth daily.  . Magnesium 200 MG TABS Take 1 tablet by mouth 2 (two) times daily.  . Misc Natural Products (OSTEO BI-FLEX TRIPLE STRENGTH) TABS Take 2 tablets by mouth.   . traZODone (DESYREL) 150 MG tablet Take 1 tablet (150 mg total) by mouth at bedtime as needed for sleep.   No facility-administered encounter medications on file as of 06/29/2016.     Activities of Daily Living In your present state of health, do you have any difficulty performing the following activities: 06/29/2016  Hearing? N  Vision? Y  Difficulty concentrating or making  decisions? N  Walking or climbing stairs? N  Dressing or bathing? N  Doing errands, shopping? N  Preparing Food and eating ? N  Using the Toilet? N  In the past six months, have you accidently leaked urine? Y  Do you have problems with loss of bowel control? N  Managing your Medications? N  Managing your Finances? N  Housekeeping or managing your Housekeeping? N  Some recent data might be hidden    Patient Care Team: Pleas Koch, NP as PCP - General (Nurse Practitioner)    Assessment:    Vision Screening Comments: Last vision exam in Fall 2017 @ [facility] and Dietary recommendations Current Exercise Habits: Home exercise routine, Type of exercise: walking, Time (Minutes): 15, Frequency (Times/Week): 7, Weekly Exercise (Minutes/Week): 105, Intensity: Mild, Exercise limited by: orthopedic condition(s)  Goals    . Increase physical activity          Starting 06/29/16, I will continue walking 15 min daily.       Fall Risk Fall Risk  06/29/2016 05/18/2015 05/18/2015 05/17/2014  Falls in the past year? No No  No No  Risk for fall due to : - - - (No Data)  Risk for fall due to (comments): - - - No risk of falls.   Depression Screen PHQ 2/9 Scores 06/29/2016 05/18/2015 05/18/2015 09/30/2014  PHQ - 2 Score 0 1 0 0     Cognitive Function MMSE - Mini Mental State Exam 06/29/2016  Orientation to time 5  Orientation to Place 5  Registration 3  Attention/ Calculation 0  Recall 3  Language- name 2 objects 0  Language- repeat 1  Language- follow 3 step command 3  Language- read & follow direction 0  Write a sentence 0  Copy design 0  Total score 20     PLEASE NOTE: A Mini-Cog screen was completed. Maximum score is 20. A value of 0 denotes this part of [place]stein MMSE was not completed or the patient failed this part of the Mini-Cog screening.   Mini-Cog Screening Orientation to Time - Max 5 pts Orientation to Place - Max 5 pts Registration - Max 3  pts Recall - Max 3 pts Language Repeat - Max 1 pts Language Follow 3 Step Command - Max 3 pts     Immunization History  Administered Date(s) Administered  . Pneumococcal Conjugate-13 05/18/2015  . Pneumococcal Polysaccharide-23 08/04/2012  . Tdap 05/17/2014   Screening Tests Health Maintenance  Topic Date Due  . INFLUENZA VACCINE  08/29/2016  . MAMMOGRAM  02/07/2018  . COLONOSCOPY  01/29/2022  . TETANUS/TDAP  05/16/2024  . DEXA SCAN  Completed  . Hepatitis C Screening  Completed  . PNA vac Low Risk Adult  Completed      Plan:     I have personally reviewed and addressed the Medicare Annual Wellness questionnaire and have noted the following in the patient's chart:  A. Medical and social history B. Use of alcohol, tobacco or illicit drugs  C. Current medications and supplements D. Functional ability and status E.  Nutritional status F.  Physical activity G. Advance directives H. List of other physicians I.  Hospitalizations, surgeries, and ER visits in previous 12 months J.  [place] to include hearing, vision, cognitive, depression L. Referrals and appointments - none  In addition, I have reviewed and discussed with patient certain preventive protocols, quality metrics, and best practice recommendations. A written personalized care plan for preventive services as well as general preventive health recommendations were provided to patient.  See attached scanned questionnaire for additional information.   Signed,   Lindell Noe, MHA, BS, LPN Health Coach

## 2016-06-29 NOTE — Progress Notes (Signed)
Pre visit review using our clinic review tool, if applicable. No additional management support is needed unless otherwise documented below in the visit note. 

## 2016-06-29 NOTE — Patient Instructions (Signed)
Lindsay Snyder , Thank you for taking time to come for your Medicare Wellness Visit. I appreciate your ongoing commitment to your health goals. Please review the following plan we discussed and let me know if I can assist you in the future.   These are the goals we discussed: Goals    . Increase physical activity          Starting 06/29/16, I will continue walking 15 min daily.        This is a list of the screening recommended for you and due dates:  Health Maintenance  Topic Date Due  . Flu Shot  08/29/2016  . Mammogram  02/07/2018  . Colon Cancer Screening  01/29/2022  . Tetanus Vaccine  05/16/2024  . DEXA scan (bone density measurement)  Completed  .  Hepatitis C: One time screening is recommended by Center for Disease Control  (CDC) for  adults born from [birth year] through 1966.   Completed  . Pneumonia vaccines  Completed   Preventive Care for Adults  A healthy lifestyle and preventive care can promote health and wellness. Preventive health guidelines for adults include the following key practices.  . A routine yearly physical is a good way to check with your health care provider about your health and preventive screening. It is a chance to share any concerns and updates on your health and to receive a thorough exam.  . Visit your dentist for a routine exam and preventive care every 6 months. Brush your teeth twice a day and floss once a day. Good oral hygiene prevents tooth decay and gum disease.  . The frequency of eye exams is based on your age, health, family medical history, use  of contact lenses, and other factors. Follow your health care provider's ecommendations for frequency of eye exams.  . Eat a healthy diet. Foods like vegetables, fruits, whole grains, low-fat dairy products, and lean protein foods contain the nutrients you need without too many calories. Decrease your intake of foods high in solid fats, added sugars, and salt. Eat the right amount of calories for you. Get  information about a proper diet from your health care provider, if necessary.  . Regular physical exercise is one of the most important things you can do for your health. Most adults should get at least 150 minutes of moderate-intensity exercise (any activity that increases your heart rate and causes you to sweat) each week. In addition, most adults need muscle-strengthening exercises on 2 or more days a week.  Silver Sneakers may be a benefit available to you. To determine eligibility, you may visit the website: www.silversneakers.com or contact program at (442)151-2770 Mon-Fri between 8AM-8PM.   . Maintain a healthy weight. The body mass index (BMI) is a screening tool to identify possible weight problems. It provides an estimate of body fat based on height and weight. Your health care provider can find your BMI and can help you achieve or maintain a healthy weight.   For adults 20 years and older: ? A BMI below 18.5 is considered underweight. ? A BMI of 18.5 to 24.9 is normal. ? A BMI of 25 to 29.9 is considered overweight. ? A BMI of 30 and above is considered obese.   . Maintain normal blood lipids and cholesterol levels by exercising and minimizing your intake of saturated fat. Eat a balanced diet with plenty of fruit and vegetables. Blood tests for lipids and cholesterol should begin at age 66 and be repeated every 5 years.  If your lipid or cholesterol levels are high, you are over 50, or you are at high risk for heart disease, you may need your cholesterol levels checked more frequently. Ongoing high lipid and cholesterol levels should be treated with medicines if diet and exercise are not working.  . If you smoke, find out from your health care provider how to quit. If you do not use tobacco, please do not start.  . If you choose to drink alcohol, please do not consume more than 2 drinks per day. One drink is considered to be 12 ounces (355 mL) of beer, 5 ounces (148 mL) of wine, or 1.5  ounces (44 mL) of liquor.  . If you are 36-74 years old, ask your health care provider if you should take aspirin to prevent strokes.  . Use sunscreen. Apply sunscreen liberally and repeatedly throughout the day. You should seek shade when your shadow is shorter than you. Protect yourself by wearing long sleeves, pants, a wide-brimmed hat, and sunglasses year round, whenever you are outdoors.  . Once a month, do a whole body skin exam, using a mirror to look at the skin on your back. Tell your health care provider of new moles, moles that have irregular borders, moles that are larger than a pencil eraser, or moles that have changed in shape or color.

## 2016-06-30 ENCOUNTER — Encounter: Payer: Self-pay | Admitting: Primary Care

## 2016-06-30 LAB — HEPATITIS C ANTIBODY: HCV AB: NEGATIVE

## 2016-07-10 ENCOUNTER — Encounter: Payer: Self-pay | Admitting: Primary Care

## 2016-07-10 ENCOUNTER — Ambulatory Visit (INDEPENDENT_AMBULATORY_CARE_PROVIDER_SITE_OTHER): Payer: Medicare Other | Admitting: Primary Care

## 2016-07-10 VITALS — BP 118/74 | HR 68 | Temp 98.1°F | Ht 59.5 in | Wt 223.1 lb

## 2016-07-10 DIAGNOSIS — F5102 Adjustment insomnia: Secondary | ICD-10-CM | POA: Diagnosis not present

## 2016-07-10 DIAGNOSIS — R011 Cardiac murmur, unspecified: Secondary | ICD-10-CM

## 2016-07-10 DIAGNOSIS — I350 Nonrheumatic aortic (valve) stenosis: Secondary | ICD-10-CM | POA: Diagnosis not present

## 2016-07-10 DIAGNOSIS — E782 Mixed hyperlipidemia: Secondary | ICD-10-CM | POA: Diagnosis not present

## 2016-07-10 DIAGNOSIS — M8580 Other specified disorders of bone density and structure, unspecified site: Secondary | ICD-10-CM | POA: Diagnosis not present

## 2016-07-10 DIAGNOSIS — E785 Hyperlipidemia, unspecified: Secondary | ICD-10-CM | POA: Insufficient documentation

## 2016-07-10 NOTE — Progress Notes (Signed)
Subjective:    Patient ID: Lindsay Snyder, female    DOB: April 12, 1945, 71 y.o.   MRN: [id number]  HPI  [name] is a 71 year old female who presents today for [place] Part 2. She met with our health advisor last week.  1) Insomnia: Currently managed on Trazodone 150 mg. The trazodone hasn't worked in 1-2 months. She's been taking Valarean Root with improvement. She plans on switching back and forth between the two medications.  2) Hyperlipidemia: Recent TC of 239, LDL of 156, Trigs of 155. She is currently managed on Aspirin 81 mg.   Diet currently consists of: She recently improved her diet, prior to this she was eating junk food, candy, donuts.   Breakfast: Protein bar, applesauce, fruit Lunch: Meat, vegetable, starch Dinner: Cereal (special K), cheese, crackers Snacks: Fruit, apple sauce Desserts: None Beverages: Hot tea, water, low calorie drink  Exercise: She is not currently exercising.   Wt Readings from Last 3 Encounters:  07/10/16 223 lb 1.9 oz (101.2 kg)  06/29/16 224 lb 8 oz (101.8 kg)  11/14/15 222 lb 12.8 oz (101.1 kg)      The 10-year ASCVD risk score Mikey Bussing DC Jr., et al., 2013) is: 9.6%   Values used to calculate the score:     Age: 32 years     Sex: Female     Is Non-Hispanic African American: No     Diabetic: No     Tobacco smoker: No     Systolic Blood Pressure: 263 mmHg     Is BP treated: No     HDL Cholesterol: 52.3 mg/dL     Total Cholesterol: 239 mg/dL   Review of Systems  Constitutional: Negative for fatigue.  Eyes: Negative for visual disturbance.  Respiratory: Negative for shortness of breath.   Cardiovascular: Negative for chest pain.  Neurological: Negative for dizziness and headaches.       Past Medical History:  Diagnosis Date  . Appendicitis 8   . Polyp of colon    Benign     Social History   Social History  . Marital status: Married    Spouse name: N/A  . Number of children: N/A  . Years of education: N/A   Occupational  History  . Not on file.   Social History Main Topics  . Smoking status: Never Smoker  . Smokeless tobacco: Never Used  . Alcohol use No  . Drug use: No  . Sexual activity: Not on file   Other Topics Concern  . Not on file   Social History Narrative   From [place], moved to [place] for her ill daughter, moved to [place], moved back to [place].   Married. Retired.   Lives in [place].   Enjoys babysitting, writing, reading, cooking.          Past Surgical History:  Procedure Laterality Date  . ABDOMINAL HYSTERECTOMY    . APPENDECTOMY    . BREAST EXCISIONAL BIOPSY Right    + 20 years    Family History  Problem Relation Age of Onset  . Diabetes Mother        Deceased  . Heart attack Father 65       Deceased  . Cancer Father        Colon  . Cancer Sister        Unusual blood cancer  . Breast cancer Paternal Aunt     Allergies  Allergen Reactions  . Tylenol [Acetaminophen] Rash  Current Outpatient Prescriptions on File Prior to Visit  Medication Sig Dispense Refill  . ALPHA LIPOIC ACID PO Take 1 capsule by mouth 2 (two) times daily.    Marland Kitchen aspirin 81 MG tablet Take 81 mg by mouth daily.    . Magnesium 200 MG TABS Take 1 tablet by mouth 2 (two) times daily.    . Misc Natural Products (OSTEO BI-FLEX TRIPLE STRENGTH) TABS Take 2 tablets by mouth.     . traZODone (DESYREL) 150 MG tablet Take 1 tablet (150 mg total) by mouth at bedtime as needed for sleep. 90 tablet 1   No current facility-administered medications on file prior to visit.     BP 118/74   Pulse 68   Temp 98.1 F (36.7 C) (Oral)   Ht 4' 11.5" (1.511 m)   Wt 223 lb 1.9 oz (101.2 kg)   SpO2 98%   BMI 44.31 kg/m    Objective:   Physical Exam  Constitutional: She is oriented to person, place, and time. She appears well-nourished.  Neck: Neck supple.  Cardiovascular: Normal rate and regular rhythm.   Murmur heard. Pulmonary/Chest: Effort normal and breath sounds normal.  Abdominal: Soft. Bowel  sounds are normal. There is no tenderness.  Neurological: She is alert and oriented to person, place, and time.  Skin: Skin is warm and dry.  Psychiatric: She has a normal mood and affect.          Assessment & Plan:

## 2016-07-10 NOTE — Assessment & Plan Note (Signed)
Discussed the importance of a healthy diet and regular exercise in order for weight loss, and to reduce the risk of other medical problems.  

## 2016-07-10 NOTE — Patient Instructions (Signed)
You will be contacted regarding your echocardiogram.  Please let us know if you have not heard back within one week.   Start exercising. You should be getting 150 minutes of moderate intensity exercise weekly.  Continue to work on improvements in your diet.  Schedule a lab only appointment in 6 months to repeat your cholesterol. Come fasting to this appointment.  Follow up in 1 year for your annual exam or sooner if needed.  It was a pleasure to see you today!

## 2016-07-10 NOTE — Assessment & Plan Note (Signed)
Slightly worse when compared to last year. Will have her work on exercise, continue with improvements in diet. Recheck lipids in 6 months.

## 2016-07-10 NOTE — Assessment & Plan Note (Signed)
Trazodone infeffective, doing better on Valerian Root. Will have her continue on that for now. She will update.

## 2016-07-10 NOTE — Assessment & Plan Note (Signed)
Noted and appears stable. Will repeat echocardiogram given history of aortic stenosis and grade 1 diastolic dysfunction. Orders placed.

## 2016-07-10 NOTE — Assessment & Plan Note (Signed)
Vitamin D stable, she declines bone density testing.

## 2016-08-07 DIAGNOSIS — H524 Presbyopia: Secondary | ICD-10-CM | POA: Diagnosis not present

## 2016-08-16 ENCOUNTER — Other Ambulatory Visit: Payer: Medicare Other

## 2016-08-20 ENCOUNTER — Ambulatory Visit (INDEPENDENT_AMBULATORY_CARE_PROVIDER_SITE_OTHER): Payer: Medicare Other

## 2016-08-20 ENCOUNTER — Other Ambulatory Visit: Payer: Self-pay

## 2016-08-20 DIAGNOSIS — R011 Cardiac murmur, unspecified: Secondary | ICD-10-CM | POA: Diagnosis not present

## 2016-08-20 DIAGNOSIS — I350 Nonrheumatic aortic (valve) stenosis: Secondary | ICD-10-CM

## 2016-08-23 DIAGNOSIS — D229 Melanocytic nevi, unspecified: Secondary | ICD-10-CM | POA: Diagnosis not present

## 2016-08-23 DIAGNOSIS — L309 Dermatitis, unspecified: Secondary | ICD-10-CM | POA: Diagnosis not present

## 2016-10-05 ENCOUNTER — Ambulatory Visit (INDEPENDENT_AMBULATORY_CARE_PROVIDER_SITE_OTHER): Payer: Medicare Other | Admitting: Primary Care

## 2016-10-05 ENCOUNTER — Encounter: Payer: Self-pay | Admitting: Primary Care

## 2016-10-05 VITALS — BP 128/80 | HR 72 | Temp 98.4°F | Resp 16 | Wt 225.4 lb

## 2016-10-05 DIAGNOSIS — R197 Diarrhea, unspecified: Secondary | ICD-10-CM

## 2016-10-05 LAB — CBC WITH DIFFERENTIAL/PLATELET
Basophils Absolute: 0 10*3/uL (ref 0.0–0.1)
Basophils Relative: 0.5 % (ref 0.0–3.0)
EOS ABS: 0.2 10*3/uL (ref 0.0–0.7)
EOS PCT: 2.1 % (ref 0.0–5.0)
HCT: 46 % (ref 36.0–46.0)
HEMOGLOBIN: 15.4 g/dL — AB (ref 12.0–15.0)
Lymphocytes Relative: 26.9 % (ref 12.0–46.0)
Lymphs Abs: 2.1 10*3/uL (ref 0.7–4.0)
MCHC: 33.4 g/dL (ref 30.0–36.0)
MCV: 88.3 fl (ref 78.0–100.0)
MONO ABS: 0.5 10*3/uL (ref 0.1–1.0)
Monocytes Relative: 6.2 % (ref 3.0–12.0)
Neutro Abs: 5 10*3/uL (ref 1.4–7.7)
Neutrophils Relative %: 64.3 % (ref 43.0–77.0)
Platelets: 222 10*3/uL (ref 150.0–400.0)
RBC: 5.21 Mil/uL — AB (ref 3.87–5.11)
RDW: 13.2 % (ref 11.5–15.5)
WBC: 7.8 10*3/uL (ref 4.0–10.5)

## 2016-10-05 LAB — COMPREHENSIVE METABOLIC PANEL
ALBUMIN: 3.8 g/dL (ref 3.5–5.2)
ALK PHOS: 57 U/L (ref 39–117)
ALT: 9 U/L (ref 0–35)
AST: 15 U/L (ref 0–37)
BUN: 20 mg/dL (ref 6–23)
CO2: 30 meq/L (ref 19–32)
Calcium: 9.3 mg/dL (ref 8.4–10.5)
Chloride: 103 mEq/L (ref 96–112)
Creatinine, Ser: 1.01 mg/dL (ref 0.40–1.20)
GFR: 57.24 mL/min — AB (ref >60.00)
GLUCOSE: 108 mg/dL — AB (ref 70–99)
POTASSIUM: 4.1 meq/L (ref 3.5–5.1)
SODIUM: 140 meq/L (ref 135–145)
TOTAL PROTEIN: 6.1 g/dL (ref 6.0–8.3)
Total Bilirubin: 0.9 mg/dL (ref 0.2–1.2)

## 2016-10-05 NOTE — Addendum Note (Signed)
Addended by: Ellamae Sia on: 10/05/2016 02:33 PM   Modules accepted: Orders

## 2016-10-05 NOTE — Progress Notes (Signed)
Subjective:    Patient ID: Lindsay Snyder, female    DOB: November 09, 1945, 71 y.o.   MRN: [id number]  HPI  Lindsay Snyder is a 71 year old female who presents today with a chief complaint of diarrhea. Her diarrhea has been present for the past 1 month. She's experiencing about 4-5 episodes of diarrhea daily. She has been taking Imodium and Pepto Bismol. Over the past week her stools have changed from liquid to a soft/watery stool, then yesterday she noticed a more solid stool. She recently learned that her son has the same symptoms for the same duration of time.   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 During diarrhea spells she's noted feeling gassy. She denies rectal bleeding, abdominal pain, nausea, vomiting, new medications/food, recent antibiotic use. She's been eating and drinking. Her father was diagnosed with colon cancer in his early 80's. Her colonoscopy is due in 2019.  Review of Systems  Constitutional: Negative for chills, fever and unexpected weight change.  Gastrointestinal: Positive for diarrhea. Negative for abdominal pain, constipation, nausea and vomiting.  Skin: Negative for color change.       Past Medical History:  Diagnosis Date  . Appendicitis 8   . Polyp of colon    Benign     Social History   Social History  . Marital status: Married    Spouse name: N/A  . Number of children: N/A  . Years of education: N/A   Occupational History  . Not on file.   Social History Main Topics  . Smoking status: Never Smoker  . Smokeless tobacco: Never Used  . Alcohol use No  . Drug use: No  . Sexual activity: Not on file   Other Topics Concern  . Not on file   Social History Narrative   From [place], moved to [place] for her ill daughter, moved to [place], moved back to [place].   Married. Retired.   Lives in [place].   Enjoys babysitting, writing, reading, cooking.          Past Surgical History:  Procedure Laterality Date  . ABDOMINAL HYSTERECTOMY    .  APPENDECTOMY    . BREAST EXCISIONAL BIOPSY Right    + 20 years    Family History  Problem Relation Age of Onset  . Diabetes Mother        Deceased  . Heart attack Father 50       Deceased  . Cancer Father        Colon  . Cancer Sister        Unusual blood cancer  . Breast cancer Paternal Aunt     Allergies  Allergen Reactions  . Tylenol [Acetaminophen] Rash    Current Outpatient Prescriptions on File Prior to Visit  Medication Sig Dispense Refill  . Magnesium 200 MG TABS Take 1 tablet by mouth 2 (two) times daily.    . traZODone (DESYREL) 150 MG tablet Take 1 tablet (150 mg total) by mouth at bedtime as needed for sleep. 90 tablet 1   No current facility-administered medications on file prior to visit.     BP 128/80   Pulse 72   Temp 98.4 F (36.9 C) (Oral)   Resp 16   Wt 225 lb 6.4 oz (102.2 kg)   SpO2 97%   BMI 44.76 kg/m    Objective:   Physical Exam  Constitutional: She appears well-nourished. She does not appear ill.  Neck: Neck supple.  Cardiovascular: Normal rate  and regular rhythm.   Pulmonary/Chest: Effort normal and breath sounds normal.  Abdominal: Soft. Bowel sounds are normal. There is no tenderness.          Assessment & Plan:  Diarrhea:  Present for the past 1 month, over the past week has seen moderate improvement. Exam today unremarkable, does not appear acutely ill. Will check stool studies given duration of symptoms. Also check CBC, CMP. Discussed to notify if she develops fevers, diarrhea returns, starts to feel worse. No alarm signs. Follow up PRN.  Sheral Flow, NP

## 2016-10-05 NOTE — Patient Instructions (Signed)
Complete lab work prior to leaving today. I will notify you of your results once received.   Return your stool specimen once completed.   Ensure you are hydrating well with water.   Please call me if your symptoms return.  It was a pleasure to see you today!   Bland Diet A bland diet consists of foods that do not have a lot of fat or fiber. Foods without fat or fiber are easier for the body to digest. They are also less likely to irritate your mouth, throat, stomach, and other parts of your gastrointestinal tract. A bland diet is sometimes called a BRAT diet. What is my plan? Your health care provider or dietitian may recommend specific changes to your diet to prevent and treat your symptoms, such as:  Eating small meals often.  Cooking food until it is soft enough to chew easily.  Chewing your food well.  Drinking fluids slowly.  Not eating foods that are very spicy, sour, or fatty.  Not eating citrus fruits, such as oranges and grapefruit.  What do I need to know about this diet?  Eat a variety of foods from the bland diet food list.  Do not follow a bland diet longer than you have to.  Ask your health care provider whether you should take vitamins. What foods can I eat? Grains  Hot cereals, such as cream of wheat. Bread, crackers, or tortillas made from refined white flour. Rice. Vegetables Canned or cooked vegetables. Mashed or boiled potatoes. Fruits Bananas. Applesauce. Other types of cooked or canned fruit with the skin and seeds removed, such as canned peaches or pears. Meats and Other Protein Sources Scrambled eggs. Creamy peanut butter or other nut butters. Lean, well-cooked meats, such as chicken or fish. Tofu. Soups or broths. Dairy Low-fat dairy products, such as milk, cottage cheese, or yogurt. Beverages Water. Herbal tea. Apple juice. Sweets and Desserts Pudding. Custard. Fruit gelatin. Ice cream. Fats and Oils Mild salad dressings. Canola or olive  oil. The items listed above may not be a complete list of allowed foods or beverages. Contact your dietitian for more options. What foods are not recommended? Foods and ingredients that are often not recommended include:  Spicy foods, such as hot sauce or salsa.  Fried foods.  Sour foods, such as pickled or fermented foods.  Raw vegetables or fruits, especially citrus or berries.  Caffeinated drinks.  Alcohol.  Strongly flavored seasonings or condiments.  The items listed above may not be a complete list of foods and beverages that are not allowed. Contact your dietitian for more information. This information is not intended to replace advice given to you by your health care provider. Make sure you discuss any questions you have with your health care provider. Document Released: 05/09/2015 Document Revised: 06/23/2015 Document Reviewed: 01/27/2014 Elsevier Interactive Patient Education  2018 [employer].

## 2016-10-08 DIAGNOSIS — R197 Diarrhea, unspecified: Secondary | ICD-10-CM | POA: Diagnosis not present

## 2016-10-09 LAB — C. DIFFICILE GDH AND TOXIN A/B
GDH ANTIGEN: NOT DETECTED
MICRO NUMBER:: 80995499
SPECIMEN QUALITY: ADEQUATE
TOXIN A AND B: NOT DETECTED

## 2016-10-09 LAB — OVA AND PARASITE EXAMINATION
CONCENTRATE RESULT:: NONE SEEN
SPECIMEN QUALITY:: ADEQUATE
TRICHROME RESULT:: NONE SEEN
VKL: 80993972

## 2016-10-12 LAB — STOOL CULTURE
MICRO NUMBER: 80993977
MICRO NUMBER: 80993978
MICRO NUMBER:: 80993979
SHIGA RESULT:: NOT DETECTED
SPECIMEN QUALITY: ADEQUATE
SPECIMEN QUALITY:: ADEQUATE
SPECIMEN QUALITY:: ADEQUATE

## 2016-10-22 ENCOUNTER — Ambulatory Visit (INDEPENDENT_AMBULATORY_CARE_PROVIDER_SITE_OTHER): Payer: Medicare Other | Admitting: Primary Care

## 2016-10-22 VITALS — BP 116/66 | HR 68 | Temp 98.4°F | Ht 59.5 in | Wt 222.0 lb

## 2016-10-22 DIAGNOSIS — M79604 Pain in right leg: Secondary | ICD-10-CM | POA: Diagnosis not present

## 2016-10-22 DIAGNOSIS — I8393 Asymptomatic varicose veins of bilateral lower extremities: Secondary | ICD-10-CM | POA: Diagnosis not present

## 2016-10-22 DIAGNOSIS — M79605 Pain in left leg: Secondary | ICD-10-CM | POA: Diagnosis not present

## 2016-10-22 NOTE — Patient Instructions (Signed)
Stop by the front desk and speak with either Rosaria Ferries or Shirlean Mylar regarding your referral to Vascular Surgery.  It was a pleasure to see you today!

## 2016-10-22 NOTE — Progress Notes (Signed)
Subjective:    Patient ID: Lindsay Snyder, female    DOB: 07/03/1945, 71 y.o.   MRN: [id number]  HPI  Lindsay Snyder is a 71 year old female with a history of varicose veins and obesity who presents today with a chief complaint of bilateral lower extremity pain. Her pain is located to the bilateral lower portion of her lower extremities below her knees. Her pain is worse at night, but is present during the day as well. She denies pain with ambulation.   She was evaluated by [facility] in Summer 2017 and was told she had a blockage to the left lower extremity near the groin. She underwent 2-3 procedures to reduce the blockage to the left lower extremity but she quit as she could not tolerate the pain. She is concerned about blood flow to her lower extremities and is ready to return to vascular surgery but wants to try another place. She has noticed that her feet will get red when in the shower, sometimes will see very pale skin to her feet other times.  She denies lower extremity or calf swelling, changes in skin color.   Review of Systems  Constitutional: Negative for fever.  Respiratory: Negative for shortness of breath.   Cardiovascular: Negative for chest pain.  Musculoskeletal:       Bilateral lower extremity pain  Skin: Positive for color change.       Past Medical History:  Diagnosis Date  . Appendicitis 8   . Polyp of colon    Benign     Social History   Social History  . Marital status: Married    Spouse name: N/A  . Number of children: N/A  . Years of education: N/A   Occupational History  . Not on file.   Social History Main Topics  . Smoking status: Never Smoker  . Smokeless tobacco: Never Used  . Alcohol use No  . Drug use: No  . Sexual activity: Not on file   Other Topics Concern  . Not on file   Social History Narrative   From [place], moved to [place] for her ill daughter, moved to [place], moved back to [place].   Married. Retired.   Lives in [place].     Enjoys babysitting, writing, reading, cooking.          Past Surgical History:  Procedure Laterality Date  . ABDOMINAL HYSTERECTOMY    . APPENDECTOMY    . BREAST EXCISIONAL BIOPSY Right    + 20 years    Family History  Problem Relation Age of Onset  . Diabetes Mother        Deceased  . Heart attack Father 45       Deceased  . Cancer Father        Colon  . Cancer Sister        Unusual blood cancer  . Breast cancer Paternal Aunt     Allergies  Allergen Reactions  . Tylenol [Acetaminophen] Rash    Current Outpatient Prescriptions on File Prior to Visit  Medication Sig Dispense Refill  . Magnesium 200 MG TABS Take 1 tablet by mouth 2 (two) times daily.     No current facility-administered medications on file prior to visit.     BP 116/66   Pulse 68   Temp 98.4 F (36.9 C) (Oral)   Ht 4' 11.5" (1.511 m)   Wt 222 lb (100.7 kg)   SpO2 98%   BMI 44.09 kg/m  Objective:   Physical Exam  Constitutional: She appears well-nourished.  Neck: Neck supple.  Cardiovascular: Normal rate and regular rhythm.   Murmur heard. Pulses:      Dorsalis pedis pulses are 2+ on the right side, and 1+ on the left side.       Posterior tibial pulses are 2+ on the right side, and 2+ on the left side.  No lower extremity edema noted  Pulmonary/Chest: Effort normal and breath sounds normal.  Skin: Skin is warm and dry.          Assessment & Plan:  Lower show no pain:   Lower extremity pain:  Located to bilateral lower extremities for nearly 1 year. Concerned about the potential blockage to the left lower extremity. Exam today without evidence of DVT, intermittent claudication. Referral placed for vascular surgery evaluation given history. Pulses overall unremarkable.  Sheral Flow, NP

## 2016-11-09 ENCOUNTER — Encounter (INDEPENDENT_AMBULATORY_CARE_PROVIDER_SITE_OTHER): Payer: Self-pay | Admitting: Vascular Surgery

## 2016-11-12 ENCOUNTER — Other Ambulatory Visit: Payer: Self-pay

## 2016-11-12 DIAGNOSIS — G4700 Insomnia, unspecified: Secondary | ICD-10-CM

## 2016-11-12 MED ORDER — TRAZODONE HCL 150 MG PO TABS: 150.0000 mg | ORAL_TABLET | Freq: Every evening | ORAL | 1 refills | Status: DC | PRN

## 2016-11-12 NOTE — Telephone Encounter (Signed)
Pt left vm requesting refill trazodone 150 mg to [facility]. Last refilled # 90 x 1 on 03/14/16. Last seen 10/22/16.

## 2016-11-12 NOTE — Telephone Encounter (Signed)
Noted, refills sent to pharmacy. 

## 2016-11-13 ENCOUNTER — Encounter (INDEPENDENT_AMBULATORY_CARE_PROVIDER_SITE_OTHER): Payer: Self-pay | Admitting: Vascular Surgery

## 2016-11-13 ENCOUNTER — Ambulatory Visit (INDEPENDENT_AMBULATORY_CARE_PROVIDER_SITE_OTHER): Payer: Medicare Other | Admitting: Vascular Surgery

## 2016-11-13 VITALS — BP 161/85 | HR 67 | Resp 16 | Ht 60.0 in | Wt 225.0 lb

## 2016-11-13 DIAGNOSIS — I83813 Varicose veins of bilateral lower extremities with pain: Secondary | ICD-10-CM | POA: Diagnosis not present

## 2016-11-13 DIAGNOSIS — E669 Obesity, unspecified: Secondary | ICD-10-CM

## 2016-11-13 DIAGNOSIS — E782 Mixed hyperlipidemia: Secondary | ICD-10-CM | POA: Diagnosis not present

## 2016-11-13 NOTE — Progress Notes (Signed)
Subjective:    Patient ID: Lindsay Snyder, female    DOB: [birth year], 71 y.o.   MRN: [id number] Chief Complaint  Patient presents with  . New Patient (Initial Visit)    Varicose Veins   Presents as a new patient referred by Dr. Carlis Abbott for "painful varicose veins". Patient endorses a history of being treated by [facility]. States that they did a in office surgery to the left groin. States she was told there was a "blockage" and blood was not getting to her heart. She also states she underwent injections into her leg that "took out blood from the veins". She stated these injections were extremely painful. She states she did not like the care she was receiving and would like to change providers for her vascular care. She has noticed over the last couple months the varicosities to her bilateral lower extremity have become more painful. The patient does engage in conservative therapy, including wearing medical grade 1 compression stockings, elevating her legs and remaining active. Her discomfort has worsened despite engaging in conservative therapy. Her discomfort has progressed to the point she is unable to function on a daily basis prompting her to seek medical attention. Over-the-counter anti-inflammatories provided minimal relief. Patient denies any fever, nausea or vomiting.   Review of Systems  Constitutional: Negative.   HENT: Negative.   Eyes: Negative.   Respiratory: Negative.   Cardiovascular: Positive for leg swelling.       Painful varicose veins  Gastrointestinal: Negative.   Endocrine: Negative.   Genitourinary: Negative.   Musculoskeletal: Negative.   Skin: Negative.   Allergic/Immunologic: Negative.   Neurological: Negative.   Hematological: Negative.   Psychiatric/Behavioral: Negative.       Objective:   Physical Exam  Constitutional: She is oriented to person, place, and time. She appears well-developed and well-nourished. No distress.  Very anxious    HENT:  Head: Normocephalic and atraumatic.  Eyes: Pupils are equal, round, and reactive to light. Conjunctivae are normal.  Neck: Normal range of motion.  Cardiovascular: Normal rate, regular rhythm, normal heart sounds and intact distal pulses.   Pulses:      Radial pulses are 2+ on the right side, and 2+ on the left side.       Dorsalis pedis pulses are 2+ on the right side, and 2+ on the left side.       Posterior tibial pulses are 2+ on the right side, and 2+ on the left side.  Pulmonary/Chest: Effort normal.  Musculoskeletal: Normal range of motion. She exhibits edema (Mild bilateral lower extremity edema noted).  Neurological: She is alert and oriented to person, place, and time.  Skin: Skin is warm and dry. She is not diaphoretic.  Less than 1 cm varicosities noted to the bilateral lower extremity. There is no stasis dermatitis. There is no skin changes. No cellulitis.  Psychiatric: She has a normal mood and affect. Her behavior is normal. Judgment and thought content normal.  Vitals reviewed.  BP (!) 161/85 (BP Location: Right Arm)   Pulse 67   Resp 16   Ht 5' (1.524 m)   Wt 225 lb (102.1 kg)   BMI 43.94 kg/m   Past Medical History:  Diagnosis Date  . Appendicitis 8   . Polyp of colon    Benign   Social History   Social History  . Marital status: Married    Spouse name: N/A  . Number of children: N/A  . Years of education: N/A  Occupational History  . Not on file.   Social History Main Topics  . Smoking status: Never Smoker  . Smokeless tobacco: Never Used  . Alcohol use No  . Drug use: No  . Sexual activity: Not on file   Other Topics Concern  . Not on file   Social History Narrative   From [place], moved to [place] for her ill daughter, moved to [place], moved back to [place].   Married. Retired.   Lives in [place].   Enjoys babysitting, writing, reading, cooking.         Past Surgical History:  Procedure Laterality Date  . ABDOMINAL HYSTERECTOMY     . APPENDECTOMY    . BREAST EXCISIONAL BIOPSY Right    + 20 years   Family History  Problem Relation Age of Onset  . Diabetes Mother        Deceased  . Heart attack Father 23       Deceased  . Cancer Father        Colon  . Cancer Sister        Unusual blood cancer  . Breast cancer Paternal Aunt    Allergies  Allergen Reactions  . Tylenol [Acetaminophen] Rash      Assessment & Plan:  Presents as a new patient referred by Dr. Carlis Abbott for "painful varicose veins". Patient endorses a history of being treated by [facility]. States that they did a in office surgery to the left groin. States she was told there was a "blockage" and blood was not getting to her heart. She also states she underwent injections into her leg that "took out blood from the veins". She stated these injections were extremely painful. She states she did not like the care she was receiving and would like to change providers for her vascular care. She has noticed over the last couple months the varicosities to her bilateral lower extremity have become more painful. The patient does engage in conservative therapy, including wearing medical grade 1 compression stockings, elevating her legs and remaining active. Her discomfort has worsened despite engaging in conservative therapy. Her discomfort has progressed to the point she is unable to function on a daily basis prompting her to seek medical attention. Over-the-counter anti-inflammatories provided minimal relief. Patient denies any fever, nausea or vomiting.  1. Varicose veins of both lower extremities with pain - New Patient presents with progressively worsening pain to her bilateral lower extremity varicosities Sounds like the patient underwent a left greater saphenous vein ablation and subsequent sclerotherapy Patient is unsure as to what procedure she underwent. Patient should continue engage in conservative therapy including wearing medical grade 1  compression stockings, elevating her legs and remaining active. I will bring the patient back for a bilateral venous duplex to rule out recannulation of the greater saphenous vein or any worsening venous reflux.  - VAS [place] LOWER EXTREMITY VENOUS REFLUX; Future  2. Obesity, unspecified classification, unspecified obesity type, unspecified whether serious comorbidity present - Stable This is intervening factor to the patient's bilateral lower extremity edema  3. Mixed hyperlipidemia - Stable Encouraged good control as its slows the progression of atherosclerotic disease  Current Outpatient Prescriptions on File Prior to Visit  Medication Sig Dispense Refill  . Magnesium 200 MG TABS Take 1 tablet by mouth 2 (two) times daily.    . traZODone (DESYREL) 150 MG tablet Take 1 tablet (150 mg total) by mouth at bedtime as needed for sleep. 90 tablet 1   No  current facility-administered medications on file prior to visit.    There are no Patient Instructions on file for this visit. No Follow-up on file.  Chasitee Zenker A Muranda Coye, PA-C

## 2017-01-03 ENCOUNTER — Other Ambulatory Visit: Payer: Self-pay | Admitting: Primary Care

## 2017-01-03 DIAGNOSIS — E782 Mixed hyperlipidemia: Secondary | ICD-10-CM

## 2017-01-03 DIAGNOSIS — E669 Obesity, unspecified: Secondary | ICD-10-CM

## 2017-01-08 ENCOUNTER — Encounter (INDEPENDENT_AMBULATORY_CARE_PROVIDER_SITE_OTHER): Payer: Medicare Other

## 2017-01-08 ENCOUNTER — Ambulatory Visit (INDEPENDENT_AMBULATORY_CARE_PROVIDER_SITE_OTHER): Payer: Medicare Other | Admitting: Vascular Surgery

## 2017-01-09 ENCOUNTER — Ambulatory Visit (INDEPENDENT_AMBULATORY_CARE_PROVIDER_SITE_OTHER): Payer: Medicare Other | Admitting: Internal Medicine

## 2017-01-09 ENCOUNTER — Encounter: Payer: Self-pay | Admitting: Internal Medicine

## 2017-01-09 ENCOUNTER — Other Ambulatory Visit (INDEPENDENT_AMBULATORY_CARE_PROVIDER_SITE_OTHER): Payer: Medicare Other

## 2017-01-09 VITALS — BP 126/84 | HR 76 | Temp 97.8°F | Wt 227.0 lb

## 2017-01-09 DIAGNOSIS — K644 Residual hemorrhoidal skin tags: Secondary | ICD-10-CM | POA: Diagnosis not present

## 2017-01-09 DIAGNOSIS — E782 Mixed hyperlipidemia: Secondary | ICD-10-CM | POA: Diagnosis not present

## 2017-01-09 DIAGNOSIS — E669 Obesity, unspecified: Secondary | ICD-10-CM | POA: Diagnosis not present

## 2017-01-09 LAB — HEMOGLOBIN A1C: Hgb A1c MFr Bld: 5.4 % (ref 4.6–6.5)

## 2017-01-09 LAB — LDL CHOLESTEROL, DIRECT: Direct LDL: 133 mg/dL

## 2017-01-09 LAB — LIPID PANEL
CHOL/HDL RATIO: 4
Cholesterol: 199 mg/dL (ref 0–200)
HDL: 49.6 mg/dL (ref >39.00)
NONHDL: 149.5
TRIGLYCERIDES: 339 mg/dL — AB (ref 0.0–149.0)
VLDL: 67.8 mg/dL — AB (ref 0.0–40.0)

## 2017-01-09 MED ORDER — HYDROCORTISONE 2.5 % RE CREA: 1.0000 "application " | TOPICAL_CREAM | Freq: Two times a day (BID) | RECTAL | 0 refills | Status: DC

## 2017-01-09 NOTE — Progress Notes (Signed)
Subjective:    Patient ID: Lindsay Snyder, female    DOB: 06-15-1945, 71 y.o.   MRN: [id number]  HPI  Pt presents to the clinic today with c/o rectal pain. This started 2-3 weeks ago. She feels like she has an external hemorrhoid. She denies itching. She has seen a small amount of blood on her toilet tissue. She denies abdominal cramping, pain or constipation. She has tried Preparation H and [employer]	[place] with minimal relief.   Review of Systems      Past Medical History:  Diagnosis Date  . Appendicitis 8   . Polyp of colon    Benign    Current Outpatient Medications  Medication Sig Dispense Refill  . aspirin EC 81 MG tablet Take by mouth.    . Calcium Carbonate 500 MG CHEW Chew by mouth.    . Cholecalciferol (VITAMIN D3) 5000 units CAPS Take by mouth.    . Flaxseed, Linseed, (FLAXSEED OIL) 1000 MG CAPS Take by mouth.    . Glucosamine HCl 500 MG TABS Take by mouth.    . Magnesium 200 MG TABS Take 1 tablet by mouth 2 (two) times daily.    . traZODone (DESYREL) 150 MG tablet Take 1 tablet (150 mg total) by mouth at bedtime as needed for sleep. 90 tablet 1  . vitamin E 400 UNIT capsule Take by mouth.    . hydrocortisone (ANUSOL-HC) 2.5 % rectal cream Place 1 application rectally 2 (two) times daily. 30 g 0   No current facility-administered medications for this visit.     Allergies  Allergen Reactions  . Tylenol [Acetaminophen] Rash    Family History  Problem Relation Age of Onset  . Diabetes Mother        Deceased  . Heart attack Father 49       Deceased  . Cancer Father        Colon  . Cancer Sister        Unusual blood cancer  . Breast cancer Paternal Aunt     Social History   Socioeconomic History  . Marital status: Married    Spouse name: Not on file  . Number of children: Not on file  . Years of education: Not on file  . Highest education level: Not on file  Social Needs  . Financial resource strain: Not on file  . Food insecurity - worry: Not on file    . Food insecurity - inability: Not on file  . Transportation needs - medical: Not on file  . Transportation needs - non-medical: Not on file  Occupational History  . Not on file  Tobacco Use  . Smoking status: Never Smoker  . Smokeless tobacco: Never Used  Substance and Sexual Activity  . Alcohol use: No    Alcohol/week: 0.0 oz  . Drug use: No  . Sexual activity: Not on file  Other Topics Concern  . Not on file  Social History Narrative   From [place], moved to [place] for her ill daughter, moved to [place], moved back to [place].   Married. Retired.   Lives in [place].   Enjoys babysitting, writing, reading, cooking.        Constitutional: Denies fever, malaise, fatigue, headache or abrupt weight changes.   Gastrointestinal: Pt reports rectal pain and blood in stool. Denies abdominal pain, bloating, constipation, diarrhea.   No other specific complaints in a complete review of systems (except as listed in HPI above).  Objective:   Physical Exam  BP 126/84   Pulse 76   Temp 97.8 F (36.6 C) (Oral)   Wt 227 lb (103 kg)   SpO2 97%   BMI 44.33 kg/m  Wt Readings from Last 3 Encounters:  01/09/17 227 lb (103 kg)  11/13/16 225 lb (102.1 kg)  10/22/16 222 lb (100.7 kg)    General: Appears her stated age, obese in NAD. Abdomen: Soft and nontender. Normal bowel sounds. No distention or masses noted.  Rectal: External hemorrhoid noted. Not thrombosed or bleeding. Normal rectal tone. No internal mass noted.  BMET    Component Value Date/Time   NA 140 10/05/2016 1433   K 4.1 10/05/2016 1433   CL 103 10/05/2016 1433   CO2 30 10/05/2016 1433   GLUCOSE 108 (H) 10/05/2016 1433   BUN 20 10/05/2016 1433   CREATININE 1.01 10/05/2016 1433   CALCIUM 9.3 10/05/2016 1433   GFRNONAA >60 04/12/2015 1452   GFRAA >60 04/12/2015 1452    Lipid Panel     Component Value Date/Time   CHOL 239 (H) 06/29/2016 1159   TRIG 155.0 (H) 06/29/2016 1159   HDL 52.30 06/29/2016 1159   CHOLHDL  5 06/29/2016 1159   VLDL 31.0 06/29/2016 1159   LDLCALC 156 (H) 06/29/2016 1159    CBC    Component Value Date/Time   WBC 7.8 10/05/2016 1433   RBC 5.21 (H) 10/05/2016 1433   HGB 15.4 (H) 10/05/2016 1433   HCT 46.0 10/05/2016 1433   PLT 222.0 10/05/2016 1433   MCV 88.3 10/05/2016 1433   MCH 29.6 04/12/2015 1452   MCHC 33.4 10/05/2016 1433   RDW 13.2 10/05/2016 1433   LYMPHSABS 2.1 10/05/2016 1433   MONOABS 0.5 10/05/2016 1433   EOSABS 0.2 10/05/2016 1433   BASOSABS 0.0 10/05/2016 1433    Hgb A1C Lab Results  Component Value Date   HGBA1C 5.4 06/29/2016            Assessment & Plan:   External Hemorrhoid:  Avoid straining with BM Can take Colace if needed eRx for Proctosol 2.5% cream BID Consider referral to GI for banding if does not resolve  Return precautions discussed Webb Silversmith, NP

## 2017-01-09 NOTE — Patient Instructions (Signed)
Hemorrhoids    Hemorrhoids are swollen veins in and around the rectum or anus. Hemorrhoids can cause pain, itching, or bleeding. Most of the time, they do not cause serious problems. They usually get better with diet changes, lifestyle changes, and other home treatments.  Follow these instructions at home:  Eating and drinking  · Eat foods that have fiber, such as whole grains, beans, nuts, fruits, and vegetables. Ask your doctor about taking products that have added fiber (fiber supplements).  · Drink enough fluid to keep your pee (urine) clear or pale yellow.  For Pain and Swelling  · Take a warm-water bath (sitz bath) for 20 minutes to ease pain. Do this 3-4 times a day.  · If directed, put ice on the painful area. It may be helpful to use ice between your warm baths.  ¨ Put ice in a plastic bag.  ¨ Place a towel between your skin and the bag.  ¨ Leave the ice on for 20 minutes, 2-3 times a day.  General instructions  · Take over-the-counter and prescription medicines only as told by your doctor.  ¨ Medicated creams and medicines that are inserted into the anus (suppositories) may be used or applied as told.  · Exercise often.  · Go to the bathroom when you have the urge to poop (to have a bowel movement). Do not wait.  · Avoid pushing too hard (straining) when you poop.  · Keep the butt area dry and clean. Use wet toilet paper or moist paper towels.  · Do not sit on the toilet for a long time.  Contact a doctor if:  · You have any of these:  ¨ Pain and swelling that do not get better with treatment or medicine.  ¨ Bleeding that will not stop.  ¨ Trouble pooping or you cannot poop.  ¨ Pain or swelling outside the area of the hemorrhoids.  This information is not intended to replace advice given to you by your health care provider. Make sure you discuss any questions you have with your health care provider.  Document Released: 10/25/2007 Document Revised: 06/23/2015 Document Reviewed: 09/29/2014  Elsevier  Interactive Patient Education © 2018 Elsevier Inc.   

## 2017-01-10 ENCOUNTER — Other Ambulatory Visit: Payer: Medicare Other

## 2017-01-11 ENCOUNTER — Encounter: Payer: Self-pay | Admitting: Primary Care

## 2017-05-22 ENCOUNTER — Ambulatory Visit (INDEPENDENT_AMBULATORY_CARE_PROVIDER_SITE_OTHER): Payer: Medicare Other | Admitting: Primary Care

## 2017-05-22 VITALS — BP 124/80 | HR 70 | Temp 98.1°F | Ht 60.0 in | Wt 230.5 lb

## 2017-05-22 DIAGNOSIS — R1032 Left lower quadrant pain: Secondary | ICD-10-CM

## 2017-05-22 NOTE — Progress Notes (Signed)
Subjective:    Patient ID: Lindsay Snyder, female    DOB: Apr 27, 1945, 72 y.o.   MRN: [id number]  HPI  Lindsay Snyder is a 72 year old female with a history of varicose veins to bilateral lower extremities who presents today to discuss varicose veins.  She was last evaluated at [facility] in October 2018 as she wanted a second opinion regarding left groin pain and potential "blockage. It was thought that she underwent left greater saphenous vein ablation and sclerotherapy when treated at [facility], the patient was unsure. During her visit at [facility] it was recommended that she continue to wear grade 1 compression stockings, elevate extremities, and remain active for varicose veins. She was set up for a lower extremity ultrasound to rule out recannulation of the greater saphenous vein.   Since her last visit she's not gone for the ultrasound as she was "never told" to go get this done. She returned from [place] the mountains (2[place]5 hour car ride) and felt a pain to the left groin that lasted 30 minutes the following day. She's worried as she was told that the procedure done to the left groin would take care of the blood flow to her left groin. She has not contacted [facility] to discuss her symptoms.  She denies changes in skin color, return in pain, extremity swelling.   Review of Systems  Respiratory: Negative for shortness of breath.   Cardiovascular: Negative for chest pain, palpitations and leg swelling.  Skin: Negative for color change.       Past Medical History:  Diagnosis Date  . Appendicitis 8   . Polyp of colon    Benign     Social History   Socioeconomic History  . Marital status: Married    Spouse name: Not on file  . Number of children: Not on file  . Years of education: Not on file  . Highest education level: Not on file  Occupational History  . Not on file  Social Needs  . Financial resource strain:  Not on file  . Food insecurity:    Worry: Not on file    Inability: Not on file  . Transportation needs:    Medical: Not on file    Non-medical: Not on file  Tobacco Use  . Smoking status: Never Smoker  . Smokeless tobacco: Never Used  Substance and Sexual Activity  . Alcohol use: No    Alcohol/week: 0.0 oz  . Drug use: No  . Sexual activity: Not on file  Lifestyle  . Physical activity:    Days per week: Not on file    Minutes per session: Not on file  . Stress: Not on file  Relationships  . Social connections:    Talks on phone: Not on file    Gets together: Not on file    Attends religious service: Not on file    Active member of club or organization: Not on file    Attends meetings of clubs or organizations: Not on file    Relationship status: Not on file  . Intimate partner violence:    Fear of current or ex partner: Not on file    Emotionally abused: Not on file    Physically abused: Not on file    Forced sexual activity: Not on file  Other Topics Concern  . Not on file  Social History Narrative   From [place], moved to [facility]  for her ill daughter, moved to [place], moved back to [place].   Married. Retired.   Lives in [place].   Enjoys babysitting, writing, reading, cooking.       Past Surgical History:  Procedure Laterality Date  . ABDOMINAL HYSTERECTOMY    . APPENDECTOMY    . BREAST EXCISIONAL BIOPSY Right    + 20 years    Family History  Problem Relation Age of Onset  . Diabetes Mother        Deceased  . Heart attack Father 51       Deceased  . Cancer Father        Colon  . Cancer Sister        Unusual blood cancer  . Breast cancer Paternal Aunt     Allergies  Allergen Reactions  . Tylenol [Acetaminophen] Rash    Current Outpatient Medications on File Prior to Visit  Medication Sig Dispense Refill  . aspirin EC 81 MG tablet Take by mouth.    . Calcium Carbonate 500 MG CHEW Chew by mouth.    . Cholecalciferol (VITAMIN D3) 1000 units CAPS  Take by mouth.    . Magnesium 200 MG TABS Take 1 tablet by mouth 2 (two) times daily.    . traZODone (DESYREL) 150 MG tablet Take 1 tablet (150 mg total) by mouth at bedtime as needed for sleep. 90 tablet 1  . vitamin E 400 UNIT capsule Take by mouth.     No current facility-administered medications on file prior to visit.     BP 124/80   Pulse 70   Temp 98.1 F (36.7 C) (Oral)   Ht 5' (1.524 m)   Wt 230 lb 8 oz (104.6 kg)   SpO2 96%   BMI 45.02 kg/m    Objective:   Physical Exam  Neck: Neck supple.  Cardiovascular: Normal rate and regular rhythm.  Murmur heard. Pulmonary/Chest: Effort normal and breath sounds normal.  Lymphadenopathy:       Left: No inguinal adenopathy present.  Skin: Skin is warm and dry.          Assessment & Plan:  Groin Discomfort:  Located to left groin after 2[place]5 [place] car [place] from [place]. Exam today without evidence of DVT. Offered ultrasound of groin to ensure no clotting or problems with site of procedure to left groin, she kindly declines. She will call [facility] tomorrow to follow up with her symptoms and discuss groin blood flow concerns.   Pleas Koch, NP

## 2017-05-22 NOTE — Patient Instructions (Signed)
Please call [facility] as discussed.   It was a pleasure to see you today!

## 2017-06-11 ENCOUNTER — Telehealth: Payer: Self-pay | Admitting: Primary Care

## 2017-06-11 DIAGNOSIS — G4700 Insomnia, unspecified: Secondary | ICD-10-CM

## 2017-06-11 MED ORDER — TRAZODONE HCL 150 MG PO TABS
150.0000 mg | ORAL_TABLET | Freq: Every evening | ORAL | 0 refills | Status: DC | PRN
Start: 2017-06-11 — End: 2017-09-17

## 2017-06-11 NOTE — Telephone Encounter (Signed)
Refill sent to pharmacy. Annual wellness visit is scheduled for June 2019.

## 2017-06-11 NOTE — Telephone Encounter (Signed)
Copied from [id number]. Topic: Quick Communication - Rx Refill/Question >> Jun 11, 2017 11:29 AM Ether Griffins B wrote: Medication: traZODone (DESYREL) 150 MG tablet Has the patient contacted their pharmacy? Yes.   (Agent: If no, request that the patient contact the pharmacy for the refill.) Preferred Pharmacy (with phone number or street name): [facility], [place]: Please be advised that RX refills may take up to 3 business days. We ask that you follow-up with your pharmacy.

## 2017-06-11 NOTE — Telephone Encounter (Signed)
Refill request for Trazodone 150mg  tab, last filled on 11/12/16 #90.  LOV: 05/22/17 [facility] [place]    [address]

## 2017-06-11 NOTE — Telephone Encounter (Signed)
Refill request for trazodone to [facility]; Last refilled # 90 x 1 on 11/12/16 Last seen for acute 05/22/17 and last annual 07/10/16.

## 2017-06-17 DIAGNOSIS — M79605 Pain in left leg: Secondary | ICD-10-CM | POA: Diagnosis not present

## 2017-06-17 DIAGNOSIS — I83892 Varicose veins of left lower extremities with other complications: Secondary | ICD-10-CM | POA: Diagnosis not present

## 2017-07-03 ENCOUNTER — Ambulatory Visit: Payer: Medicare Other

## 2017-07-05 ENCOUNTER — Other Ambulatory Visit: Payer: Self-pay | Admitting: Primary Care

## 2017-07-05 DIAGNOSIS — E782 Mixed hyperlipidemia: Secondary | ICD-10-CM

## 2017-07-08 ENCOUNTER — Ambulatory Visit (INDEPENDENT_AMBULATORY_CARE_PROVIDER_SITE_OTHER): Payer: Medicare Other

## 2017-07-08 VITALS — BP 118/78 | HR 69 | Temp 97.8°F | Ht 59.25 in | Wt 224.5 lb

## 2017-07-08 DIAGNOSIS — E782 Mixed hyperlipidemia: Secondary | ICD-10-CM | POA: Diagnosis not present

## 2017-07-08 DIAGNOSIS — Z0000 Encounter for general adult medical examination without abnormal findings: Secondary | ICD-10-CM

## 2017-07-08 LAB — LIPID PANEL
CHOL/HDL RATIO: 5
Cholesterol: 237 mg/dL — ABNORMAL HIGH (ref 0–200)
HDL: 46.5 mg/dL (ref >39.00)
LDL CALC: 169 mg/dL — AB (ref 0–99)
NONHDL: 190.71
Triglycerides: 109 mg/dL (ref 0.0–149.0)
VLDL: 21.8 mg/dL (ref 0.0–40.0)

## 2017-07-08 LAB — COMPREHENSIVE METABOLIC PANEL
ALT: 13 U/L (ref 0–35)
AST: 19 U/L (ref 0–37)
Albumin: 3.9 g/dL (ref 3.5–5.2)
Alkaline Phosphatase: 57 U/L (ref 39–117)
BUN: 14 mg/dL (ref 6–23)
CHLORIDE: 103 meq/L (ref 96–112)
CO2: 29 meq/L (ref 19–32)
CREATININE: 0.97 mg/dL (ref 0.40–1.20)
Calcium: 9.5 mg/dL (ref 8.4–10.5)
GFR: 59.84 mL/min — ABNORMAL LOW (ref >60.00)
GLUCOSE: 94 mg/dL (ref 70–99)
Potassium: 4.2 mEq/L (ref 3.5–5.1)
SODIUM: 139 meq/L (ref 135–145)
Total Bilirubin: 1.1 mg/dL (ref 0.2–1.2)
Total Protein: 6.6 g/dL (ref 6.0–8.3)

## 2017-07-08 NOTE — Progress Notes (Signed)
PCP notes:   Health maintenance:  No gaps identified.   Abnormal screenings:   Mini-Cog score: 19/20 MMSE - Mini Mental State Exam 07/08/2017 06/29/2016  Orientation to time 5 5  Orientation to Place 5 5  Registration 3 3  Attention/ Calculation 0 0  Recall 2 3  Language- name 2 objects 0 0  Language- repeat 1 1  Language- follow 3 step command 3 3  Language- read & follow direction 0 0  Write a sentence 0 0  Copy design 0 0  Total score 19 20    Hearing - failed  Hearing Screening   125Hz  250Hz  500Hz  1000Hz  2000Hz  3000Hz  4000Hz  6000Hz  8000Hz   Right ear:   40 40 40  0    Left ear:   40 40 40  0     Patient concerns:   None  Nurse concerns:  None  Next PCP appt:   07/11/2017 @ 1015

## 2017-07-08 NOTE — Progress Notes (Signed)
I reviewed health advisor's note, was available for consultation, and agree with documentation and plan.  

## 2017-07-08 NOTE — Patient Instructions (Signed)
Ms. Archibeque , Thank you for taking time to come for your Medicare Wellness Visit. I appreciate your ongoing commitment to your health goals. Please review the following plan we discussed and let me know if I can assist you in the future.   These are the goals we discussed: Goals    . Patient Stated     Starting 07/08/2017, I will continue to eat an Atkins bar and Pralle beans for breakfast, a regular meal for lunch, and rice cakes with peanut butter for dinner. I will continue to limit intake of added sugar to my diet.        This is a list of the screening recommended for you and due dates:  Health Maintenance  Topic Date Due  . Flu Shot  08/29/2017  . Mammogram  02/07/2018  . Colon Cancer Screening  01/29/2022  . Tetanus Vaccine  05/16/2024  . DEXA scan (bone density measurement)  Completed  .  Hepatitis C: One time screening is recommended by Center for Disease Control  (CDC) for  adults born from [birth year] through 1966.   Completed  . Pneumonia vaccines  Completed   Preventive Care for Adults  A healthy lifestyle and preventive care can promote health and wellness. Preventive health guidelines for adults include the following key practices.  . A routine yearly physical is a good way to check with your health care provider about your health and preventive screening. It is a chance to share any concerns and updates on your health and to receive a thorough exam.  . Visit your dentist for a routine exam and preventive care every 6 months. Brush your teeth twice a day and floss once a day. Good oral hygiene prevents tooth decay and gum disease.  . The frequency of eye exams is based on your age, health, family medical history, use  of contact lenses, and other factors. Follow your health care provider's recommendations for frequency of eye exams.  . Eat a healthy diet. Foods like vegetables, fruits, whole grains, low-fat dairy products, and lean protein foods contain the nutrients you need  without too many calories. Decrease your intake of foods high in solid fats, added sugars, and salt. Eat the right amount of calories for you. Get information about a proper diet from your health care provider, if necessary.  . Regular physical exercise is one of the most important things you can do for your health. Most adults should get at least 150 minutes of moderate-intensity exercise (any activity that increases your heart rate and causes you to sweat) each week. In addition, most adults need muscle-strengthening exercises on 2 or more days a week.  Silver Sneakers may be a benefit available to you. To determine eligibility, you may visit the website: www.silversneakers.com or contact program at 248-739-0346 Mon-Fri between 8AM-8PM.   . Maintain a healthy weight. The body mass index (BMI) is a screening tool to identify possible weight problems. It provides an estimate of body fat based on height and weight. Your health care provider can find your BMI and can help you achieve or maintain a healthy weight.   For adults 20 years and older: ? A BMI below 18.5 is considered underweight. ? A BMI of 18.5 to 24.9 is normal. ? A BMI of 25 to 29.9 is considered overweight. ? A BMI of 30 and above is considered obese.   . Maintain normal blood lipids and cholesterol levels by exercising and minimizing your intake of saturated fat. Eat a  balanced diet with plenty of fruit and vegetables. Blood tests for lipids and cholesterol should begin at age 41 and be repeated every 5 years. If your lipid or cholesterol levels are high, you are over 50, or you are at high risk for heart disease, you may need your cholesterol levels checked more frequently. Ongoing high lipid and cholesterol levels should be treated with medicines if diet and exercise are not working.  . If you smoke, find out from your health care provider how to quit. If you do not use tobacco, please do not start.  . If you choose to drink  alcohol, please do not consume more than 2 drinks per day. One drink is considered to be 12 ounces (355 mL) of beer, 5 ounces (148 mL) of wine, or 1.5 ounces (44 mL) of liquor.  . If you are 74-95 years old, ask your health care provider if you should take aspirin to prevent strokes.  . Use sunscreen. Apply sunscreen liberally and repeatedly throughout the day. You should seek shade when your shadow is shorter than you. Protect yourself by wearing long sleeves, pants, a wide-brimmed hat, and sunglasses year round, whenever you are outdoors.  . Once a month, do a whole body skin exam, using a mirror to look at the skin on your back. Tell your health care provider of new moles, moles that have irregular borders, moles that are larger than a pencil eraser, or moles that have changed in shape or color.

## 2017-07-08 NOTE — Progress Notes (Signed)
Subjective:   Lindsay Snyder is a 72 y.o. female who presents for Medicare Annual (Subsequent) preventive examination.  Review of Systems:  N/A Cardiac Risk Factors include: advanced age (>45men, >47 women);obesity (BMI >30kg/m2)     Objective:     Vitals: BP 118/78 (BP Location: Right Wrist, Patient Position: Sitting, Cuff Size: Normal)   Pulse 69   Temp 97.8 F (36.6 C) (Oral)   Ht 4' 11.25" (1.505 m) Comment: shoes  Wt 224 lb 8 oz (101.8 kg)   SpO2 97%   BMI 44.96 kg/m   Body mass index is 44.96 kg/m.  Advanced Directives 07/08/2017 11/13/2016 06/29/2016 04/12/2015  Does Patient Have a Medical Advance Directive? Yes Yes Yes No  Type of Paramedic of [place];Living will - [place] in Chart? Yes - No - copy requested -  Would patient like information on creating a medical advance directive? No - Patient declined - - -    Tobacco Social History   Tobacco Use  Smoking Status Never Smoker  Smokeless Tobacco Never Used     Counseling given: No   Clinical Intake:  Pre-visit preparation completed: Yes  Pain : No/denies pain Pain Score: 0-No pain     Nutritional Status: BMI > 30  Obese Nutritional Risks: None Diabetes: No  How often do you need to have someone help you when you read instructions, pamphlets, or other written materials from your doctor or pharmacy?: 1 - Never What is the last grade level you completed in school?: 12th grade  Interpreter Needed?: No  Comments: pt and spouse live together Information entered by :: LPinson, LPN  Past Medical History:  Diagnosis Date  . Appendicitis 7   . Polyp of colon    Benign   Past Surgical History:  Procedure Laterality Date  . ABDOMINAL HYSTERECTOMY    . APPENDECTOMY    . BREAST EXCISIONAL BIOPSY Right    + 20 years   Family History  Problem Relation Age of Onset  . Diabetes Mother        Deceased  . Heart  attack Father 8       Deceased  . Cancer Father        Colon  . Cancer Sister        Unusual blood cancer  . Breast cancer Paternal Aunt    Social History   Socioeconomic History  . Marital status: Married    Spouse name: Not on file  . Number of children: Not on file  . Years of education: Not on file  . Highest education level: Not on file  Occupational History  . Not on file  Social Needs  . Financial resource strain: Not on file  . Food insecurity:    Worry: Not on file    Inability: Not on file  . Transportation needs:    Medical: Not on file    Non-medical: Not on file  Tobacco Use  . Smoking status: Never Smoker  . Smokeless tobacco: Never Used  Substance and Sexual Activity  . Alcohol use: No    Alcohol/week: 0.0 oz  . Drug use: No  . Sexual activity: Not on file  Lifestyle  . Physical activity:    Days per week: Not on file    Minutes per session: Not on file  . Stress: Not on file  Relationships  . Social connections:    Talks on phone: Not  on file    Gets together: Not on file    Attends religious service: Not on file    Active member of club or organization: Not on file    Attends meetings of clubs or organizations: Not on file    Relationship status: Not on file  Other Topics Concern  . Not on file  Social History Narrative   From [place], moved to [place] for her ill daughter, moved to [place], moved back to [place].   Married. Retired.   Lives in [place].   Enjoys babysitting, writing, reading, cooking.       Outpatient Encounter Medications as of 07/08/2017  Medication Sig  . aspirin EC 81 MG tablet Take by mouth.  . Calcium Carbonate 500 MG CHEW Chew by mouth.  . Cholecalciferol (VITAMIN D3) 1000 units CAPS Take by mouth.  . Magnesium 200 MG TABS Take 1 tablet by mouth 2 (two) times daily.  . Omega-3 Fatty Acids (FISH OIL PO) Take 2 capsules by mouth daily.  Marland Kitchen OVER THE COUNTER MEDICATION Take 2 capsules by mouth daily.  . traZODone (DESYREL)  150 MG tablet Take 1 tablet (150 mg total) by mouth at bedtime as needed for sleep.  . [DISCONTINUED] vitamin E 400 UNIT capsule Take by mouth.   No facility-administered encounter medications on file as of 07/08/2017.     Activities of Daily Living In your present state of health, do you have any difficulty performing the following activities: 07/08/2017  Hearing? N  Vision? N  Difficulty concentrating or making decisions? N  Walking or climbing stairs? N  Dressing or bathing? N  Doing errands, shopping? N  Preparing Food and eating ? N  Using the Toilet? N  In the past six months, have you accidently leaked urine? Y  Do you have problems with loss of bowel control? N  Managing your Medications? N  Managing your Finances? N  Housekeeping or managing your Housekeeping? N  Some recent data might be hidden    Patient Care Team: Pleas Koch, NP as PCP - General (Nurse Practitioner)    Assessment:   This is a routine wellness examination for Lindsay Snyder.   Hearing Screening   125Hz  250Hz  500Hz  1000Hz  2000Hz  3000Hz  4000Hz  6000Hz  8000Hz   Right ear:   40 40 40  0    Left ear:   40 40 40  0    Vision Screening Comments: Vision exam in July 2018 @ [facility] and Dietary recommendations Current Exercise Habits: The patient does not participate in regular exercise at present(does daily housework), Exercise limited by: None identified  Goals    . Patient Stated     Starting 07/08/2017, I will continue to eat an Atkins bar and Hajjar beans for breakfast, a regular meal for lunch, and rice cakes with peanut butter for dinner. I will continue to limit intake of added sugar to my diet.        Fall Risk Fall Risk  07/08/2017 06/29/2016 05/18/2015 05/18/2015 05/17/2014  Falls in the past year? No No No No No  Risk for fall due to : - - - - (No Data)  Risk for fall due to: Comment - - - - No risk of falls.   Depression Screen PHQ 2/9 Scores 07/08/2017 06/29/2016  05/18/2015 05/18/2015  PHQ - 2 Score 0 0 1 0  PHQ- 9 Score 0 - - -     Cognitive Function MMSE - Mini Mental State  Exam 07/08/2017 06/29/2016  Orientation to time 5 5  Orientation to Place 5 5  Registration 3 3  Attention/ Calculation 0 0  Recall 2 3  Language- name 2 objects 0 0  Language- repeat 1 1  Language- follow 3 step command 3 3  Language- read & follow direction 0 0  Write a sentence 0 0  Copy design 0 0  Total score 19 20     PLEASE NOTE: A Mini-Cog screen was completed. Maximum score is 20. A value of 0 denotes this part of Folstein MMSE was not completed or the patient failed this part of the Mini-Cog screening.   Mini-Cog Screening Orientation to Time - Max 5 pts Orientation to Place - Max 5 pts Registration - Max 3 pts Recall - Max 3 pts Language Repeat - Max 1 pts Language Follow 3 Step Command - Max 3 pts     Immunization History  Administered Date(s) Administered  . Pneumococcal Conjugate-13 05/18/2015  . Pneumococcal Polysaccharide-23 08/04/2012  . Tdap 05/17/2014   Screening Tests Health Maintenance  Topic Date Due  . INFLUENZA VACCINE  08/29/2017  . MAMMOGRAM  02/07/2018  . COLONOSCOPY  01/29/2022  . TETANUS/TDAP  05/16/2024  . DEXA SCAN  Completed  . Hepatitis C Screening  Completed  . PNA vac Low Risk Adult  Completed       Plan:     I have personally reviewed, addressed, and noted the following in the patient's chart:  A. Medical and social history B. Use of alcohol, tobacco or illicit drugs  C. Current medications and supplements D. Functional ability and status E.  Nutritional status F.  Physical activity G. Advance directives H. List of other physicians I.  Hospitalizations, surgeries, and ER visits in previous 12 months J.  [place] to include hearing, vision, cognitive, depression L. Referrals and appointments - none  In addition, I have reviewed and discussed with patient certain preventive protocols, quality  metrics, and best practice recommendations. A written personalized care plan for preventive services as well as general preventive health recommendations were provided to patient.  See attached scanned questionnaire for additional information.   Signed,   Lindell Noe, MHA, BS, LPN Health Coach

## 2017-07-11 ENCOUNTER — Encounter: Payer: Self-pay | Admitting: Primary Care

## 2017-07-11 ENCOUNTER — Telehealth: Payer: Self-pay

## 2017-07-11 ENCOUNTER — Ambulatory Visit (INDEPENDENT_AMBULATORY_CARE_PROVIDER_SITE_OTHER): Payer: Medicare Other | Admitting: Primary Care

## 2017-07-11 VITALS — BP 122/78 | HR 76 | Temp 98.2°F | Ht 59.25 in | Wt 223.2 lb

## 2017-07-11 DIAGNOSIS — F5102 Adjustment insomnia: Secondary | ICD-10-CM | POA: Diagnosis not present

## 2017-07-11 DIAGNOSIS — Z1231 Encounter for screening mammogram for malignant neoplasm of breast: Secondary | ICD-10-CM

## 2017-07-11 DIAGNOSIS — E782 Mixed hyperlipidemia: Secondary | ICD-10-CM

## 2017-07-11 DIAGNOSIS — Z0000 Encounter for general adult medical examination without abnormal findings: Secondary | ICD-10-CM | POA: Diagnosis not present

## 2017-07-11 DIAGNOSIS — K649 Unspecified hemorrhoids: Secondary | ICD-10-CM | POA: Diagnosis not present

## 2017-07-11 DIAGNOSIS — E669 Obesity, unspecified: Secondary | ICD-10-CM

## 2017-07-11 DIAGNOSIS — Z1211 Encounter for screening for malignant neoplasm of colon: Secondary | ICD-10-CM

## 2017-07-11 DIAGNOSIS — R011 Cardiac murmur, unspecified: Secondary | ICD-10-CM | POA: Diagnosis not present

## 2017-07-11 DIAGNOSIS — I83813 Varicose veins of bilateral lower extremities with pain: Secondary | ICD-10-CM | POA: Diagnosis not present

## 2017-07-11 DIAGNOSIS — M8580 Other specified disorders of bone density and structure, unspecified site: Secondary | ICD-10-CM

## 2017-07-11 DIAGNOSIS — Z1239 Encounter for other screening for malignant neoplasm of breast: Secondary | ICD-10-CM

## 2017-07-11 MED ORDER — ROSUVASTATIN CALCIUM 5 MG PO TABS: ORAL_TABLET | ORAL | 3 refills | Status: DC

## 2017-07-11 NOTE — Assessment & Plan Note (Signed)
Intermittent, overall improved with home remedies.  Proctosol cream was never helpful.

## 2017-07-11 NOTE — Progress Notes (Signed)
Subjective:    Patient ID: Lindsay Snyder, female    DOB: 1945-10-08, 72 y.o.   MRN: [id number]  HPI  Lindsay Snyder is a 72 year old female who presents today for complete physical.  Immunizations: -Tetanus: Completed in 2016 -Pneumonia: Completed Prevnar in 2017, Pneumovax in 2014 -Shingles: Declines   Diet: She endorses fair diet. Breakfast: Atkins bar, [place] beans Lunch: Meat, vegetable Dinner: Rice cake with peanut butter, Conlee beans, cottage cheese, eggs Snacks: Occasionally, protein bar Desserts: None Beverages: Water, tea with stevia, low calorie grape juice  Exercise: She is not exercising, active in her home Eye exam: Due in July  Dental exam: Completes annually  Colonoscopy: Due in November 2019 Dexa: Due Pap Smear: Hysterectomy  Mammogram: Completed in 2018 Hep C Screen: Negative in 2018   The 10-year ASCVD risk score Mikey Bussing DC Jr., et al., 2013) is: 11.3%   Values used to calculate the score:     Age: 33 years     Sex: Female     Is Non-Hispanic African American: No     Diabetic: No     Tobacco smoker: No     Systolic Blood Pressure: 341 mmHg     Is BP treated: No     HDL Cholesterol: 46.5 mg/dL     Total Cholesterol: 237 mg/dL   Review of Systems  Constitutional: Negative for unexpected weight change.  HENT: Negative for rhinorrhea.   Respiratory: Negative for cough and shortness of breath.   Cardiovascular: Negative for chest pain.  Gastrointestinal: Negative for constipation and diarrhea.  Genitourinary: Negative for difficulty urinating and menstrual problem.  Musculoskeletal: Negative for arthralgias and myalgias.  Skin: Negative for rash.  Allergic/Immunologic: Negative for environmental allergies.  Neurological: Negative for dizziness, numbness and headaches.       Past Medical History:  Diagnosis Date  . Appendicitis 7   . Polyp of colon    Benign     Social History   Socioeconomic History  . Marital status: Married    Spouse  name: Not on file  . Number of children: Not on file  . Years of education: Not on file  . Highest education level: Not on file  Occupational History  . Not on file  Social Needs  . Financial resource strain: Not on file  . Food insecurity:    Worry: Not on file    Inability: Not on file  . Transportation needs:    Medical: Not on file    Non-medical: Not on file  Tobacco Use  . Smoking status: Never Smoker  . Smokeless tobacco: Never Used  Substance and Sexual Activity  . Alcohol use: No    Alcohol/week: 0.0 oz  . Drug use: No  . Sexual activity: Not on file  Lifestyle  . Physical activity:    Days per week: Not on file    Minutes per session: Not on file  . Stress: Not on file  Relationships  . Social connections:    Talks on phone: Not on file    Gets together: Not on file    Attends religious service: Not on file    Active member of club or organization: Not on file    Attends meetings of clubs or organizations: Not on file    Relationship status: Not on file  . Intimate partner violence:    Fear of current or ex partner: Not on file    Emotionally abused: Not on file    Physically  abused: Not on file    Forced sexual activity: Not on file  Other Topics Concern  . Not on file  Social History Narrative   From [place], moved to [place] for her ill daughter, moved to [place], moved back to [place].   Married. Retired.   Lives in [place].   Enjoys babysitting, writing, reading, cooking.       Past Surgical History:  Procedure Laterality Date  . ABDOMINAL HYSTERECTOMY    . APPENDECTOMY    . BREAST EXCISIONAL BIOPSY Right    + 20 years    Family History  Problem Relation Age of Onset  . Diabetes Mother        Deceased  . Heart attack Father 42       Deceased  . Cancer Father        Colon  . Cancer Sister        Unusual blood cancer  . Breast cancer Paternal Aunt     Allergies  Allergen Reactions  . Tylenol [Acetaminophen] Rash    Current Outpatient  Medications on File Prior to Visit  Medication Sig Dispense Refill  . aspirin EC 81 MG tablet Take by mouth.    . Calcium Carbonate 500 MG CHEW Chew by mouth.    . Cholecalciferol (VITAMIN D3) 1000 units CAPS Take by mouth.    . Magnesium 200 MG TABS Take 1 tablet by mouth 2 (two) times daily.    . Omega-3 Fatty Acids (FISH OIL PO) Take 2 capsules by mouth daily.    Marland Kitchen OVER THE COUNTER MEDICATION Take 2 capsules by mouth daily.    . traZODone (DESYREL) 150 MG tablet Take 1 tablet (150 mg total) by mouth at bedtime as needed for sleep. 90 tablet 0   No current facility-administered medications on file prior to visit.     BP 122/78   Pulse 76   Temp 98.2 F (36.8 C) (Oral)   Ht 4' 11.25" (1.505 m)   Wt 223 lb 4 oz (101.3 kg)   SpO2 97%   BMI 44.71 kg/m    Objective:   Physical Exam  Constitutional: She is oriented to person, place, and time. She appears well-nourished.  HENT:  Mouth/Throat: No oropharyngeal exudate.  Eyes: Pupils are equal, round, and reactive to light. EOM are normal.  Neck: Neck supple. No thyromegaly present.  Cardiovascular: Normal rate and regular rhythm.  Respiratory: Effort normal and breath sounds normal.  GI: Soft. Bowel sounds are normal. There is no tenderness.  Musculoskeletal: Normal range of motion.  Neurological: She is alert and oriented to person, place, and time.  Skin: Skin is warm and dry.  Psychiatric: She has a normal mood and affect.           Assessment & Plan:

## 2017-07-11 NOTE — Assessment & Plan Note (Signed)
Stable, continue to monitor  ?

## 2017-07-11 NOTE — Assessment & Plan Note (Signed)
Discussed the importance of a healthy diet and regular exercise in order for weight loss, and to reduce the risk of any potential medical problems.  

## 2017-07-11 NOTE — Telephone Encounter (Signed)
Copied from [place] [id number]. Topic: General - Other >> Jul 11, 2017  2:46 PM Neva Seat wrote: Pt noticed on her paperwork from today's visit w/ Carlis Abbott is going to the wrong pharmacy.  Please send her refills to:  [facility] [place] - [address] [place] [address] Phone: [phone] Fax: [phone]

## 2017-07-11 NOTE — Assessment & Plan Note (Addendum)
Compliant to calcium and vitamin D.  Bone density scan ordered and pending.

## 2017-07-11 NOTE — Assessment & Plan Note (Signed)
Overall doing well on Trazodone, continue same.  

## 2017-07-11 NOTE — Assessment & Plan Note (Signed)
LDL above goal. ASCVD risk score of 11.3% Patient agreeable to statin therapy, Rx for Crestor sent to pharmacy.  Repeat lipids and LFT's in 6 weeks.

## 2017-07-11 NOTE — Assessment & Plan Note (Addendum)
Immunizations UTD, declines Shingles vaccination. Bone density scan due, pending. Colonoscopy due in November 2019, orders placed. Mammogram due, orders placed. Discussed the importance of a healthy diet and regular exercise in order for weight loss, and to reduce the risk of any potential medical problems. Exam unremarkable. Labs with hyperlipidemia, otherwise unremarkable.  Follow up in 1 year for CPE.

## 2017-07-11 NOTE — Patient Instructions (Addendum)
You will be contacted regarding your referral to GI for the colonoscopy.  Please let us know if you have not been contacted within one week.   You will be contacted regarding your mammogram and bone density scan.  Please let us know if you have not been contacted within one week.   Start exercising. You should be getting 150 minutes of moderate intensity exercise weekly.  Continue to increase consumption of vegetables, fruit, lean protein.  Ensure you are consuming 64 ounces of water daily.  Start rosuvastatin 5 mg tablets for cholesterol. Take 1 tablet by mouth every evening at bedtime.   Schedule a lab only appointment in 6 weeks to recheck cholesterol.  Follow up in 1 year for your annual exam or sooner if needed.  It was a pleasure to see you today!   Preventive Care 3 Years and Older, Female Preventive care refers to lifestyle choices and visits with your health care provider that can promote health and wellness. What does preventive care include?  A yearly physical exam. This is also called an annual well check.  Dental exams once or twice a year.  Routine eye exams. Ask your health care provider how often you should have your eyes checked.  Personal lifestyle choices, including: ? Daily care of your teeth and gums. ? Regular physical activity. ? Eating a healthy diet. ? Avoiding tobacco and drug use. ? Limiting alcohol use. ? Practicing safe sex. ? Taking low-dose aspirin every day. ? Taking vitamin and mineral supplements as recommended by your health care provider. What happens during an annual well check? The services and screenings done by your health care provider during your annual well check will depend on your age, overall health, lifestyle risk factors, and family history of disease. Counseling Your health care provider may ask you questions about your:  Alcohol use.  Tobacco use.  Drug use.  Emotional well-being.  Home and relationship  well-being.  Sexual activity.  Eating habits.  History of falls.  Memory and ability to understand (cognition).  Work and work Statistician.  Reproductive health.  Screening You may have the following tests or measurements:  Height, weight, and BMI.  Blood pressure.  Lipid and cholesterol levels. These may be checked every 5 years, or more frequently if you are over 64 years old.  Skin check.  Lung cancer screening. You may have this screening every year starting at age 89 if you have a 30-pack-year history of smoking and currently smoke or have quit within the past 15 years.  Fecal occult blood test (FOBT) of the stool. You may have this test every year starting at age 9.  Flexible sigmoidoscopy or colonoscopy. You may have a sigmoidoscopy every 5 years or a colonoscopy every 10 years starting at age 74.  Hepatitis C blood test.  Hepatitis B blood test.  Sexually transmitted disease (STD) testing.  Diabetes screening. This is done by checking your blood sugar (glucose) after you have not eaten for a while (fasting). You may have this done every 1-3 years.  Bone density scan. This is done to screen for osteoporosis. You may have this done starting at age 3.  Mammogram. This may be done every 1-2 years. Talk to your health care provider about how often you should have regular mammograms.  Talk with your health care provider about your test results, treatment options, and if necessary, the need for more tests. Vaccines Your health care provider may recommend certain vaccines, such as:  Influenza  vaccine. This is recommended every year.  Tetanus, diphtheria, and acellular pertussis (Tdap, Td) vaccine. You may need a Td booster every 10 years.  Varicella vaccine. You may need this if you have not been vaccinated.  Zoster vaccine. You may need this after age 28.  Measles, mumps, and rubella (MMR) vaccine. You may need at least one dose of MMR if you were born in  1958 or later. You may also need a second dose.  Pneumococcal 13-valent conjugate (PCV13) vaccine. One dose is recommended after age 49.  Pneumococcal polysaccharide (PPSV23) vaccine. One dose is recommended after age 38.  Meningococcal vaccine. You may need this if you have certain conditions.  Hepatitis A vaccine. You may need this if you have certain conditions or if you travel or work in places where you may be exposed to hepatitis A.  Hepatitis B vaccine. You may need this if you have certain conditions or if you travel or work in places where you may be exposed to hepatitis B.  Haemophilus influenzae type b (Hib) vaccine. You may need this if you have certain conditions.  Talk to your health care provider about which screenings and vaccines you need and how often you need them. This information is not intended to replace advice given to you by your health care provider. Make sure you discuss any questions you have with your health care provider. Document Released: 02/11/2015 Document Revised: 10/05/2015 Document Reviewed: 11/16/2014 Elsevier Interactive Patient Education  [employer].

## 2017-07-11 NOTE — Telephone Encounter (Signed)
Sent refill crestor electronically to [facility] as requested; I called [facility] and spoke with [place] pharmacist and she will deactivate crestor rx. Per DPR left v/m for pt that refill was sent to [facility] [place] as requested.

## 2017-07-11 NOTE — Assessment & Plan Note (Signed)
Overall tolerable, denies concern.

## 2017-07-11 NOTE — Telephone Encounter (Signed)
Pt called back and is aware Rx sent to correct pharmacy

## 2017-07-12 ENCOUNTER — Encounter: Payer: Self-pay | Admitting: Primary Care

## 2017-07-14 IMAGING — MG MM DIGITAL SCREENING BILAT W/ CAD
4 series · 4 of 4 positions shown · non-contrast
Comparison: Previous exam(s).

CLINICAL DATA: Screening.

EXAM:
DIGITAL SCREENING BILATERAL MAMMOGRAM WITH CAD

[R CC]
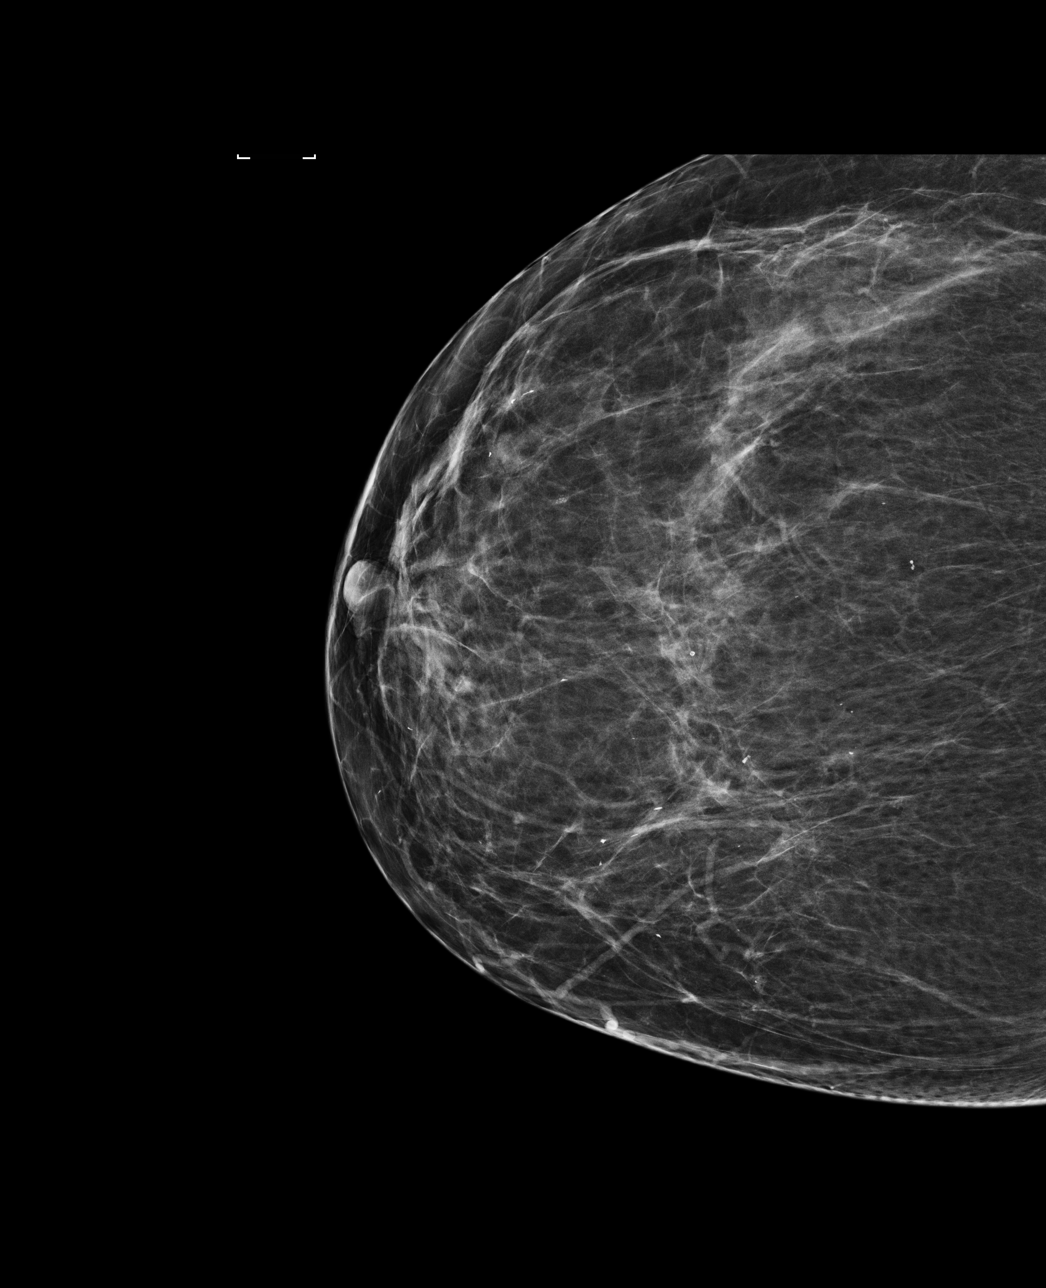

[L MLO]
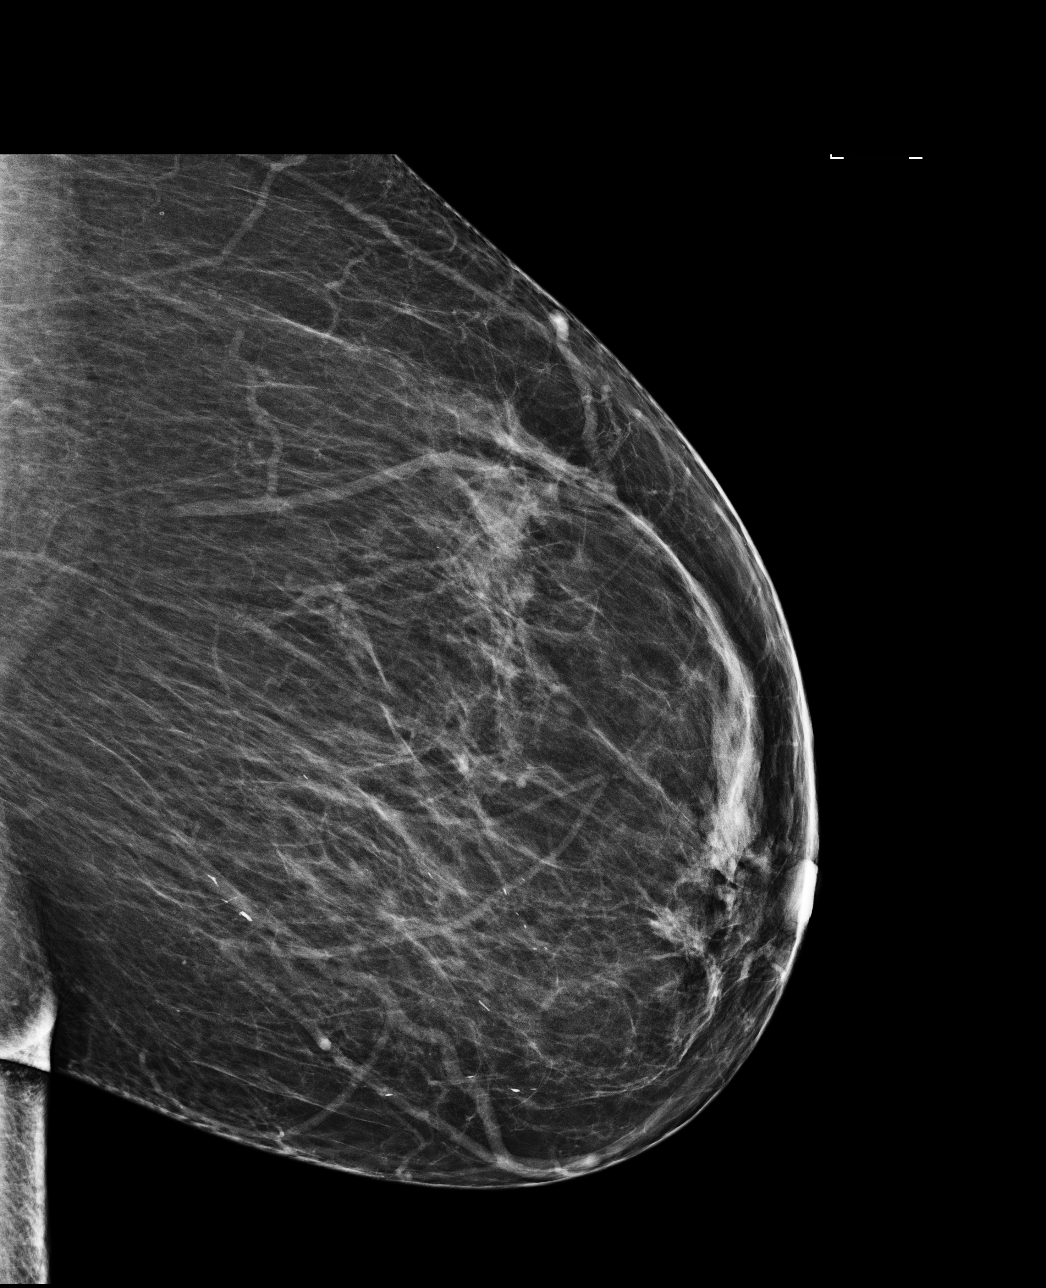

[R MLO]
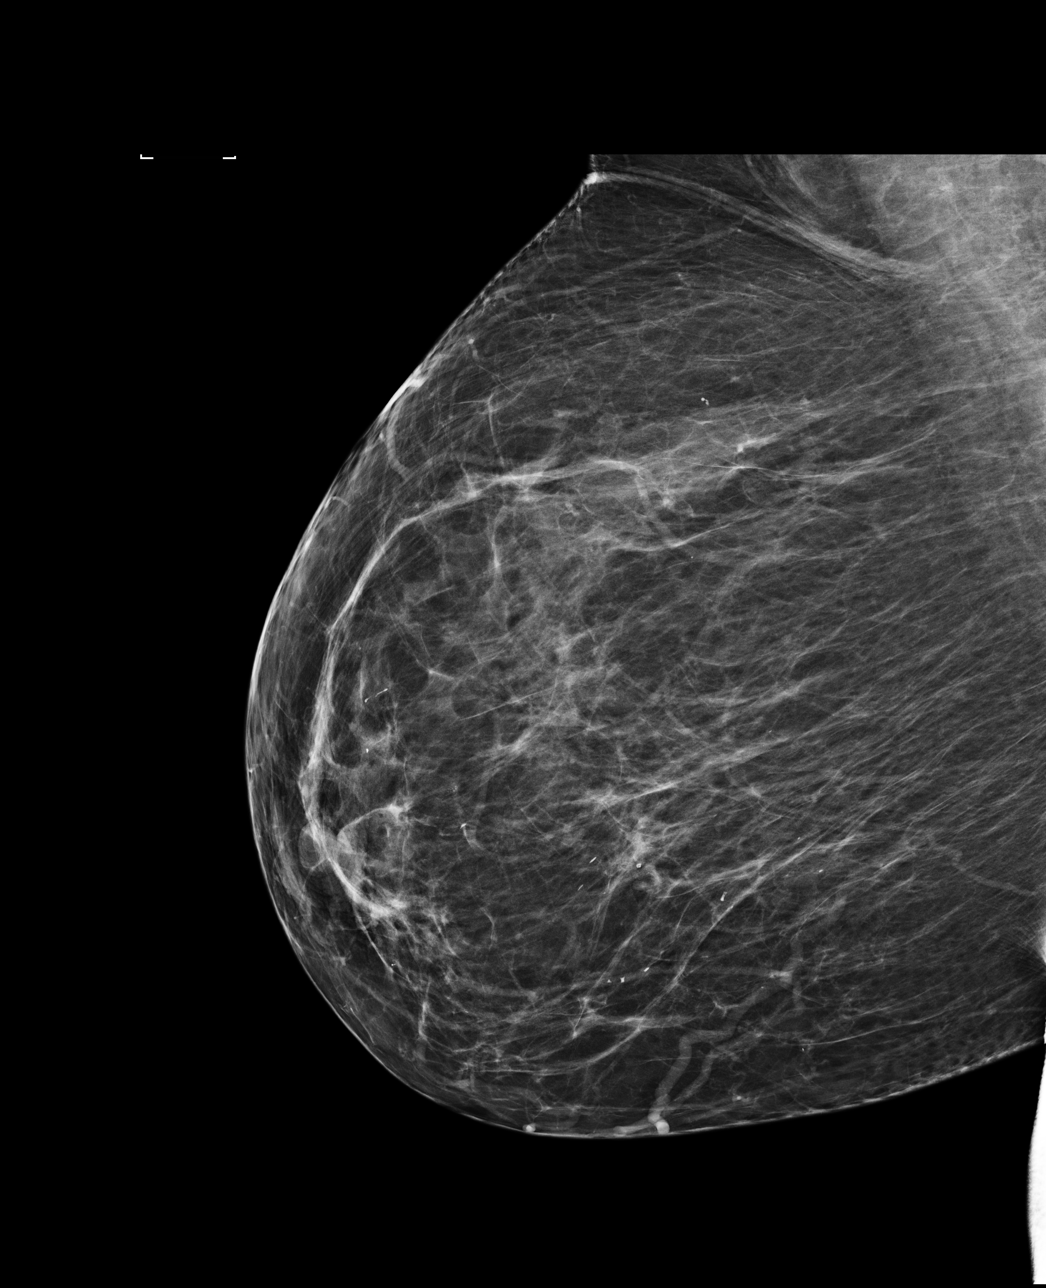

[L CC]
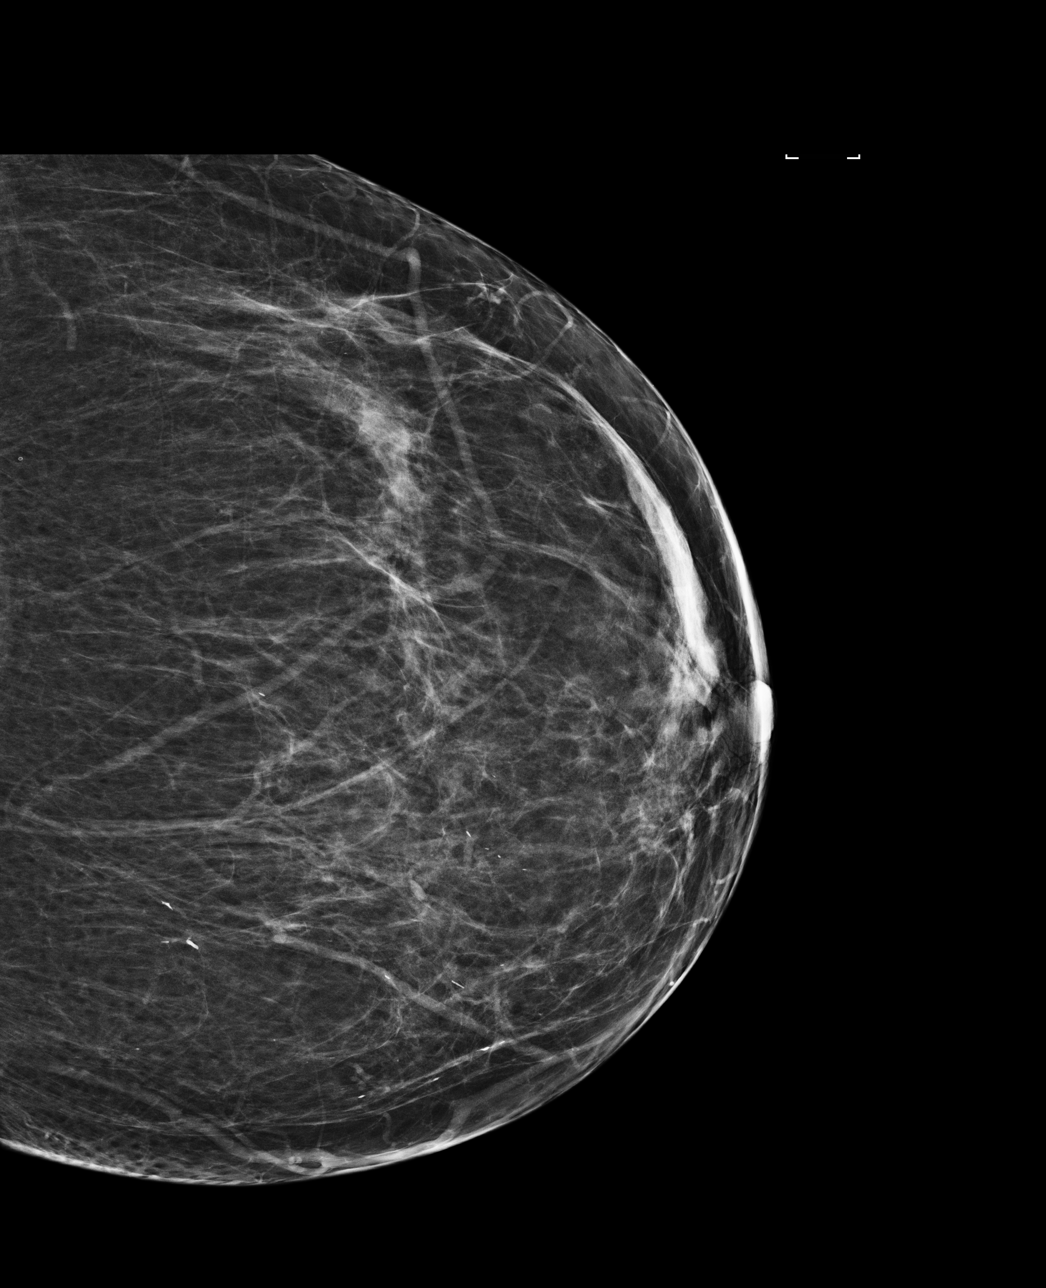

[4 of 4 positions shown; findings below may reference images not displayed]

ACR Breast Density Category b: There are scattered areas of
fibroglandular density.
FINDINGS: There are no findings suspicious for malignancy. Images were
processed with CAD.
IMPRESSION: No mammographic evidence of malignancy. A result letter of this
screening mammogram will be mailed directly to the patient.

RECOMMENDATION:
Screening mammogram in one year. (Code:AS-G-LCT)

BI-RADS CATEGORY  1: Negative.

## 2017-07-16 ENCOUNTER — Telehealth: Payer: Self-pay | Admitting: *Deleted

## 2017-07-16 NOTE — Telephone Encounter (Signed)
Appointment 6/24 in [place] Pt aware

## 2017-07-16 NOTE — Telephone Encounter (Signed)
Copied from [place] [id number]. Topic: Referral - Request >> Jul 16, 2017  7:45 AM Marja Kays F wrote: Pt is needing to talk with referrals to get a mammogran and bone density  in [place]   Best number is        [phone] this is pt husbands line and ok to leave a message

## 2017-07-22 ENCOUNTER — Other Ambulatory Visit: Payer: Self-pay

## 2017-07-22 ENCOUNTER — Ambulatory Visit
Admission: RE | Admit: 2017-07-22 | Discharge: 2017-07-22 | Disposition: A | Discharge status: Home / Self Care | Payer: Medicare Other | Source: Ambulatory Visit | Attending: Primary Care | Admitting: Primary Care

## 2017-07-22 DIAGNOSIS — M8580 Other specified disorders of bone density and structure, unspecified site: Secondary | ICD-10-CM

## 2017-07-22 DIAGNOSIS — Z1239 Encounter for other screening for malignant neoplasm of breast: Secondary | ICD-10-CM

## 2017-07-22 DIAGNOSIS — M85852 Other specified disorders of bone density and structure, left thigh: Secondary | ICD-10-CM | POA: Insufficient documentation

## 2017-07-22 DIAGNOSIS — Z1211 Encounter for screening for malignant neoplasm of colon: Secondary | ICD-10-CM

## 2017-07-22 DIAGNOSIS — Z1231 Encounter for screening mammogram for malignant neoplasm of breast: Secondary | ICD-10-CM | POA: Insufficient documentation

## 2017-07-22 DIAGNOSIS — Z780 Asymptomatic menopausal state: Secondary | ICD-10-CM | POA: Diagnosis not present

## 2017-07-23 ENCOUNTER — Encounter: Payer: Self-pay | Admitting: Primary Care

## 2017-07-31 DIAGNOSIS — H524 Presbyopia: Secondary | ICD-10-CM | POA: Diagnosis not present

## 2017-08-26 ENCOUNTER — Other Ambulatory Visit: Payer: Medicare Other

## 2017-09-17 ENCOUNTER — Other Ambulatory Visit: Payer: Self-pay

## 2017-09-17 DIAGNOSIS — G4700 Insomnia, unspecified: Secondary | ICD-10-CM

## 2017-09-17 MED ORDER — TRAZODONE HCL 150 MG PO TABS: 150.0000 mg | ORAL_TABLET | Freq: Every evening | ORAL | 0 refills | Status: DC | PRN

## 2017-09-23 ENCOUNTER — Other Ambulatory Visit: Payer: Self-pay | Admitting: Primary Care

## 2017-09-23 DIAGNOSIS — G4700 Insomnia, unspecified: Secondary | ICD-10-CM

## 2017-12-03 ENCOUNTER — Ambulatory Visit (INDEPENDENT_AMBULATORY_CARE_PROVIDER_SITE_OTHER): Payer: Medicare Other | Admitting: Primary Care

## 2017-12-03 VITALS — BP 120/76 | HR 75 | Temp 98.5°F | Ht 59.25 in | Wt 212.8 lb

## 2017-12-03 DIAGNOSIS — H01136 Eczematous dermatitis of left eye, unspecified eyelid: Secondary | ICD-10-CM | POA: Diagnosis not present

## 2017-12-03 MED ORDER — TRIAMCINOLONE ACETONIDE 0.1 % EX CREA: 1.0000 "application " | TOPICAL_CREAM | Freq: Two times a day (BID) | CUTANEOUS | 0 refills | Status: DC

## 2017-12-03 NOTE — Patient Instructions (Signed)
Apply triamcinolone 0.1% cream to the left upper, outer eyelid twice daily x 1 week. Can also use a small amount into the outer ear canals with q-tip as discussed.  Do not get the cream into your eye.   Please follow up with your eye doctor if no improvement.  It was a pleasure to see you today!

## 2017-12-03 NOTE — Progress Notes (Signed)
Subjective:    Patient ID: Lindsay Snyder, female    DOB: 1945-07-13, 72 y.o.   MRN: [id number]  HPI  Lindsay Snyder is a 72 year old female who presents today with a chief complaint of eye discomfort.  She reports itching, burning, dryness, and discomfort to her eye lids with skin pieces falling off near the inner canthus. This is located to the bilateral eyes, worse to the left than right. She's been using OTC allergy eye drops and water without significant improvement.   She denies changes in vision. Symptoms have been present for the last several weeks. She's not taken anything orally OTC for symptoms. She has noticed itchy skin into the ear canals as well. She denies drainage, crusting around the eyes, eyes matted shut in the morning.   Review of Systems  Constitutional: Negative for fever.  HENT: Negative for congestion.   Eyes: Positive for itching. Negative for discharge and visual disturbance.       Eye lid swelling and itching       Past Medical History:  Diagnosis Date  . Appendicitis 8   . Polyp of colon    Benign     Social History   Socioeconomic History  . Marital status: Married    Spouse name: Not on file  . Number of children: Not on file  . Years of education: Not on file  . Highest education level: Not on file  Occupational History  . Not on file  Social Needs  . Financial resource strain: Not on file  . Food insecurity:    Worry: Not on file    Inability: Not on file  . Transportation needs:    Medical: Not on file    Non-medical: Not on file  Tobacco Use  . Smoking status: Never Smoker  . Smokeless tobacco: Never Used  Substance and Sexual Activity  . Alcohol use: No    Alcohol/week: 0.0 standard drinks  . Drug use: No  . Sexual activity: Not on file  Lifestyle  . Physical activity:    Days per week: Not on file    Minutes per session: Not on file  . Stress: Not on file  Relationships  . Social connections:    Talks on phone: Not on file      Gets together: Not on file    Attends religious service: Not on file    Active member of club or organization: Not on file    Attends meetings of clubs or organizations: Not on file    Relationship status: Not on file  . Intimate partner violence:    Fear of current or ex partner: Not on file    Emotionally abused: Not on file    Physically abused: Not on file    Forced sexual activity: Not on file  Other Topics Concern  . Not on file  Social History Narrative   From [place], moved to [place] for her ill daughter, moved to [place], moved back to [place].   Married. Retired.   Lives in [place].   Enjoys babysitting, writing, reading, cooking.       Past Surgical History:  Procedure Laterality Date  . ABDOMINAL HYSTERECTOMY    . APPENDECTOMY    . BREAST EXCISIONAL BIOPSY Right    + 20 years neg    Family History  Problem Relation Age of Onset  . Diabetes Mother        Deceased  . Heart attack Father 76  Deceased  . Cancer Father        Colon  . Cancer Sister        Unusual blood cancer  . Breast cancer Paternal Aunt     Allergies  Allergen Reactions  . Tylenol [Acetaminophen] Rash    Current Outpatient Medications on File Prior to Visit  Medication Sig Dispense Refill  . aspirin EC 81 MG tablet Take by mouth.    . Calcium Carbonate 500 MG CHEW Chew by mouth.    . Cholecalciferol (VITAMIN D3) 1000 units CAPS Take by mouth.    . Magnesium 200 MG TABS Take 1 tablet by mouth 2 (two) times daily.    . Omega-3 Fatty Acids (FISH OIL PO) Take 2 capsules by mouth daily.    Marland Kitchen OVER THE COUNTER MEDICATION Take 2 capsules by mouth daily.    . traZODone (DESYREL) 150 MG tablet Take 1 tablet (150 mg total) by mouth at bedtime as needed for sleep. 90 tablet 0  . Magnesium Gluconate (MAGNESIUM 27) 500 (27 Mg) MG TABS      No current facility-administered medications on file prior to visit.     BP 120/76   Pulse 75   Temp 98.5 F (36.9 C) (Oral)   Ht 4' 11.25" (1.505 m)    Wt 212 lb 12 oz (96.5 kg)   SpO2 98%   BMI 42.61 kg/m    Objective:   Physical Exam  Constitutional: She appears well-nourished.  Eyes: Right eye exhibits no discharge, no exudate and no hordeolum. No foreign body present in the right eye. Left eye exhibits no discharge, no exudate and no hordeolum. No foreign body present in the left eye. Right conjunctiva is not injected. Right conjunctiva has no hemorrhage. Left conjunctiva is not injected. Left conjunctiva has no hemorrhage. No scleral icterus.    Mild swelling and erythema with dry skin to left inner canthus of upper lid  Skin: Rash noted.           Assessment & Plan:  Eczema of Eyelid:  Itching, burning, pain to left upper lid x 2 weeks. Exam today with eczema like rash to left upper lid. Sclera unremarkable. Will start with low potency topical steroids, discussed directions for use and to apply carefully.  She will update.   Pleas Koch, NP

## 2017-12-10 ENCOUNTER — Other Ambulatory Visit: Payer: Self-pay | Admitting: *Deleted

## 2017-12-10 ENCOUNTER — Encounter: Payer: Self-pay | Admitting: *Deleted

## 2017-12-12 ENCOUNTER — Other Ambulatory Visit: Payer: Self-pay

## 2017-12-12 DIAGNOSIS — G4700 Insomnia, unspecified: Secondary | ICD-10-CM

## 2017-12-12 MED ORDER — TRAZODONE HCL 150 MG PO TABS: 150.0000 mg | ORAL_TABLET | Freq: Every evening | ORAL | 1 refills | Status: DC | PRN

## 2017-12-16 ENCOUNTER — Encounter
Admission: RE | Disposition: A | Discharge status: Home / Self Care | Payer: Self-pay | Source: Ambulatory Visit | Attending: Gastroenterology

## 2017-12-16 ENCOUNTER — Encounter: Payer: Self-pay | Admitting: *Deleted

## 2017-12-16 ENCOUNTER — Ambulatory Visit
Admission: RE | Admit: 2017-12-16 | Discharge: 2017-12-16 | Disposition: A | Discharge status: Home / Self Care | Payer: Medicare Other | Source: Ambulatory Visit | Attending: Gastroenterology | Admitting: Gastroenterology

## 2017-12-16 ENCOUNTER — Ambulatory Visit: Payer: Medicare Other | Admitting: Anesthesiology

## 2017-12-16 DIAGNOSIS — D120 Benign neoplasm of cecum: Secondary | ICD-10-CM

## 2017-12-16 DIAGNOSIS — Z86010 Personal history of colonic polyps: Secondary | ICD-10-CM | POA: Diagnosis not present

## 2017-12-16 DIAGNOSIS — E669 Obesity, unspecified: Secondary | ICD-10-CM | POA: Insufficient documentation

## 2017-12-16 DIAGNOSIS — Z7982 Long term (current) use of aspirin: Secondary | ICD-10-CM | POA: Insufficient documentation

## 2017-12-16 DIAGNOSIS — D122 Benign neoplasm of ascending colon: Secondary | ICD-10-CM | POA: Insufficient documentation

## 2017-12-16 DIAGNOSIS — Z1211 Encounter for screening for malignant neoplasm of colon: Secondary | ICD-10-CM | POA: Diagnosis not present

## 2017-12-16 DIAGNOSIS — K644 Residual hemorrhoidal skin tags: Secondary | ICD-10-CM | POA: Insufficient documentation

## 2017-12-16 DIAGNOSIS — Z6839 Body mass index (BMI) 39.0-39.9, adult: Secondary | ICD-10-CM | POA: Insufficient documentation

## 2017-12-16 DIAGNOSIS — D126 Benign neoplasm of colon, unspecified: Secondary | ICD-10-CM | POA: Diagnosis not present

## 2017-12-16 DIAGNOSIS — K635 Polyp of colon: Secondary | ICD-10-CM | POA: Diagnosis not present

## 2017-12-16 DIAGNOSIS — D123 Benign neoplasm of transverse colon: Secondary | ICD-10-CM | POA: Diagnosis not present

## 2017-12-16 HISTORY — PX: COLONOSCOPY WITH PROPOFOL: SHX5780

## 2017-12-16 SURGERY — COLONOSCOPY WITH PROPOFOL
Anesthesia: General

## 2017-12-16 MED ORDER — LIDOCAINE HCL (PF) 2 % IJ SOLN
INTRAMUSCULAR | Status: AC
  Filled 2017-12-16: qty 10

## 2017-12-16 MED ORDER — SODIUM CHLORIDE (PF) 0.9 % IJ SOLN
INTRAMUSCULAR | Status: DC | PRN
  Administered 2017-12-16: 12 mL via INTRAVENOUS

## 2017-12-16 MED ORDER — SODIUM CHLORIDE 0.9 % IV SOLN
INTRAVENOUS | Status: DC
  Administered 2017-12-16: 07:00:00 via INTRAVENOUS

## 2017-12-16 MED ORDER — PHENYLEPHRINE HCL 10 MG/ML IJ SOLN
INTRAMUSCULAR | Status: AC
  Filled 2017-12-16: qty 1

## 2017-12-16 MED ORDER — PROPOFOL 500 MG/50ML IV EMUL
INTRAVENOUS | Status: DC | PRN
  Administered 2017-12-16: 125 ug/kg/min via INTRAVENOUS

## 2017-12-16 MED ORDER — PROPOFOL 500 MG/50ML IV EMUL
INTRAVENOUS | Status: AC
  Filled 2017-12-16: qty 50

## 2017-12-16 MED ORDER — SUCCINYLCHOLINE CHLORIDE 20 MG/ML IJ SOLN
INTRAMUSCULAR | Status: AC
  Filled 2017-12-16: qty 1

## 2017-12-16 MED ORDER — PROPOFOL 10 MG/ML IV BOLUS
INTRAVENOUS | Status: DC | PRN
  Administered 2017-12-16 (×4): 50 mg via INTRAVENOUS
  Administered 2017-12-16: 30 mg via INTRAVENOUS

## 2017-12-16 MED ORDER — SODIUM CHLORIDE (PF) 0.9 % IJ SOLN
INTRAMUSCULAR | Status: AC
  Filled 2017-12-16: qty 10

## 2017-12-16 MED ORDER — EPHEDRINE SULFATE 50 MG/ML IJ SOLN
INTRAMUSCULAR | Status: AC
  Filled 2017-12-16: qty 1

## 2017-12-16 NOTE — Anesthesia Preprocedure Evaluation (Signed)
Anesthesia Evaluation  Patient identified by MRN, date of birth, ID band Patient awake    Reviewed: Allergy & Precautions, NPO status , Patient's Chart, lab work & pertinent test results  History of Anesthesia Complications Negative for: history of anesthetic complications  Airway Mallampati: III  TM Distance: >3 FB Neck ROM: Full    Dental no notable dental hx.    Pulmonary neg pulmonary ROS, neg sleep apnea, neg COPD,    breath sounds clear to auscultation- rhonchi (-) wheezing      Cardiovascular Exercise Tolerance: Good (-) hypertension(-) CAD, (-) Past MI, (-) Cardiac Stents and (-) CABG  Rhythm:Regular Rate:Normal - Systolic murmurs and - Diastolic murmurs    Neuro/Psych negative neurological ROS  negative psych ROS   GI/Hepatic negative GI ROS, Neg liver ROS,   Endo/Other  negative endocrine ROSneg diabetes  Renal/GU negative Renal ROS     Musculoskeletal negative musculoskeletal ROS (+)   Abdominal (+) + obese,   Peds  Hematology negative hematology ROS (+)   Anesthesia Other Findings Past Medical History: 8 : Appendicitis No date: Polyp of colon     Comment:  Benign   Reproductive/Obstetrics                             Anesthesia Physical Anesthesia Plan  ASA: II  Anesthesia Plan: General   Post-op Pain Management:    Induction: Intravenous  PONV Risk Score and Plan: 2 and Propofol infusion  Airway Management Planned: Natural Airway  Additional Equipment:   Intra-op Plan:   Post-operative Plan:   Informed Consent: I have reviewed the patients History and Physical, chart, labs and discussed the procedure including the risks, benefits and alternatives for the proposed anesthesia with the patient or authorized representative who has indicated his/her understanding and acceptance.   Dental advisory given  Plan Discussed with: CRNA and  Anesthesiologist  Anesthesia Plan Comments:         Anesthesia Quick Evaluation

## 2017-12-16 NOTE — Anesthesia Postprocedure Evaluation (Signed)
Anesthesia Post Note  Patient: Lindsay Snyder  Procedure(s) Performed: COLONOSCOPY WITH PROPOFOL (N/A )  Patient location during evaluation: Endoscopy Anesthesia Type: General Level of consciousness: awake and alert and oriented Pain management: pain level controlled Vital Signs Assessment: post-procedure vital signs reviewed and stable Respiratory status: spontaneous breathing, nonlabored ventilation and respiratory function stable Cardiovascular status: blood pressure returned to baseline and stable Postop Assessment: no signs of nausea or vomiting Anesthetic complications: no     Last Vitals:  Vitals:   12/16/17 0814 12/16/17 0816  BP: (!) 105/57   Resp: 16   Temp: (!) 36.1 C 36.7 C  SpO2:      Last Pain:  Vitals:   12/16/17 0816  TempSrc: Tympanic                 Tajee Savant

## 2017-12-16 NOTE — Op Note (Signed)
[facility] Gastroenterology Patient Name: Lindsay Snyder Procedure Date: 12/16/2017 7:15 AM MRN: [id number] Account #: [id number] Date of Birth: Jan 10, 1946 Admit Type: Outpatient Age: 72 Room: [facility] ENDO ROOM 4 Gender: Female Note Status: Finalized Procedure:            Colonoscopy Indications:          High risk colon cancer surveillance: Personal history                        of colonic polyps Providers:            Jonathon Bellows MD, MD Referring MD:         Pleas Koch (Referring MD) Medicines:            Monitored Anesthesia Care Complications:        No immediate complications. Procedure:            Pre-Anesthesia Assessment:                       - Prior to the procedure, a History and Physical was                        performed, and patient medications, allergies and                        sensitivities were reviewed. The patient's tolerance of                        previous anesthesia was reviewed.                       - The risks and benefits of the procedure and the                        sedation options and risks were discussed with the                        patient. All questions were answered and informed                        consent was obtained.                       - ASA Grade Assessment: II - A patient with mild                        systemic disease.                       After obtaining informed consent, the colonoscope was                        passed under direct vision. Throughout the procedure,                        the patient's blood pressure, pulse, and oxygen                        saturations were monitored continuously. The                        Colonoscope  was introduced through the anus and                        advanced to the the cecum, identified by the                        appendiceal orifice, IC valve and transillumination.                        The colonoscopy was performed with ease. The patient                tolerated the procedure well. The quality of the bowel                        preparation was good. Findings:      Hemorrhoids were found on perianal exam.      A 12 mm polyp was found in the cecum. The polyp was sessile.       Preparations were made for mucosal resection. 3 mL of saline was       injected with adequate lift of the lesion from the muscularis propria.       Snare mucosal resection with suction (via the working channel) retrieval       was performed. A 14 mm area was resected. Resection and retrieval were       complete. There was no bleeding during and at the end of the procedure.      A 9 mm polyp was found in the proximal ascending colon. The polyp was       sessile. Area was successfully injected with 3 mL saline for a lift       polypectomy. The polyp was removed with a cold snare. Resection and       retrieval were complete. To prevent bleeding after the polypectomy, one       hemostatic clip was successfully placed. There was no bleeding at the       end of the procedure.      A 10 mm polyp was found in the proximal transverse colon. The polyp was       sessile. Preparations were made for mucosal resection. Saline was       injected to raise the lesion. Snare mucosal resection was performed.       Resection and retrieval were complete. To close a defect after       polypectomy, one hemostatic clip was successfully placed. There was no       bleeding during, or at the end, of the procedure.      The exam was otherwise without abnormality on direct and retroflexion       views. Impression:           - Hemorrhoids found on perianal exam.                       - One 12 mm polyp in the cecum, removed with mucosal                        resection. Resected and retrieved.                       - One 9 mm polyp in the proximal ascending colon,  removed with a cold snare. Resected and retrieved.                        Injected. Clip was  placed.                       - One 10 mm polyp in the proximal transverse colon,                        removed with mucosal resection. Resected and retrieved.                        Clip was placed.                       - The examination was otherwise normal on direct and                        retroflexion views.                       - Mucosal resection was performed. Resection and                        retrieval were complete.                       - Mucosal resection was performed. Resection and                        retrieval were complete. Recommendation:       - Discharge patient to home (with escort).                       - Resume previous diet.                       - Continue present medications.                       - Await pathology results.                       - Repeat colonoscopy in 3 years for surveillance.                       - No NSAID's for 6 weeks Procedure Code(s):    --- Professional ---                       [phone], 59, Colonoscopy, flexible; with endoscopic                        mucosal resection                       62130, Colonoscopy, flexible; with removal of tumor(s),                        polyp(s), or other lesion(s) by snare technique                       45381, 59, Colonoscopy, flexible; with directed  submucosal injection(s), any substance Diagnosis Code(s):    --- Professional ---                       Z86.010, Personal history of colonic polyps                       D12.0, Benign neoplasm of cecum                       D12.2, Benign neoplasm of ascending colon                       D12.3, Benign neoplasm of transverse colon (hepatic                        flexure or splenic flexure)                       K64.9, Unspecified hemorrhoids CPT copyright 2018 American Medical Association. All rights reserved. The codes documented in this report are preliminary and upon coder review may  be revised to meet current  compliance requirements. Jonathon Bellows, MD Jonathon Bellows MD, MD 12/16/2017 8:14:11 AM This report has been signed electronically. Number of Addenda: 0 Note Initiated On: 12/16/2017 7:15 AM Scope Withdrawal Time: 0 hours 20 minutes 39 seconds  Total Procedure Duration: 0 hours 26 minutes 59 seconds       [facility]

## 2017-12-16 NOTE — Anesthesia Post-op Follow-up Note (Signed)
Anesthesia QCDR form completed.        

## 2017-12-16 NOTE — Transfer of Care (Signed)
Immediate Anesthesia Transfer of Care Note  Patient: Lindsay Snyder  Procedure(s) Performed: COLONOSCOPY WITH PROPOFOL (N/A )  Patient Location: PACU and Endoscopy Unit  Anesthesia Type:General  Level of Consciousness: awake  Airway & Oxygen Therapy: Patient Spontanous Breathing  Post-op Assessment: Report given to RN  Post vital signs: stable  Last Vitals:  Vitals Value Taken Time  BP 105/57 12/16/2017  8:15 AM  Temp 36.1 C 12/16/2017  8:14 AM  Pulse 69 12/16/2017  8:15 AM  Resp 14 12/16/2017  8:15 AM  SpO2 95 % 12/16/2017  8:15 AM  Vitals shown include unvalidated device data.  Last Pain:  Vitals:   12/16/17 0814  TempSrc: Tympanic         Complications: No apparent anesthesia complications

## 2017-12-16 NOTE — H&P (Signed)
Lindsay Bellows, MD [address], [place], [place], [place], [address] [address], [place], [place], [place], [address] Phone: [phone]  Fax: [phone]  Primary Care Physician:  Pleas Koch, NP   Pre-Procedure History & Physical: HPI:  Lindsay Snyder is a 72 y.o. female is here for an colonoscopy.   Past Medical History:  Diagnosis Date  . Appendicitis 8   . Polyp of colon    Benign    Past Surgical History:  Procedure Laterality Date  . ABDOMINAL HYSTERECTOMY    . APPENDECTOMY    . BREAST EXCISIONAL BIOPSY Right    + 20 years neg    Prior to Admission medications   Medication Sig Start Date End Date Taking? Authorizing Provider  Calcium Carbonate 500 MG CHEW Chew by mouth.   Yes [provider]  Cholecalciferol (VITAMIN D3) 1000 units CAPS Take by mouth.   Yes [provider]  Magnesium 200 MG TABS Take 1 tablet by mouth 2 (two) times daily.   Yes [provider]  Magnesium Gluconate (MAGNESIUM 27) 500 (27 Mg) MG TABS    Yes [provider]  traZODone (DESYREL) 150 MG tablet Take 1 tablet (150 mg total) by mouth at bedtime as needed for sleep. 12/12/17  Yes Pleas Koch, NP  aspirin EC 81 MG tablet Take by mouth.    [provider]  Omega-3 Fatty Acids (FISH OIL PO) Take 2 capsules by mouth daily.    [provider]  OVER THE COUNTER MEDICATION Take 2 capsules by mouth daily.    [provider]  triamcinolone cream (KENALOG) 0.1 % Apply 1 application topically 2 (two) times daily. 12/03/17   Pleas Koch, NP    Allergies as of 07/22/2017 - Review Complete 07/11/2017  Allergen Reaction Noted  . Tylenol [acetaminophen] Rash 04/15/2014    Family History  Problem Relation Age of Onset  . Diabetes Mother        Deceased  . Heart attack Father 11       Deceased  . Cancer Father        Colon  . Cancer Sister        Unusual blood cancer  . Breast cancer Paternal Aunt     Social  History   Socioeconomic History  . Marital status: Married    Spouse name: Not on file  . Number of children: Not on file  . Years of education: Not on file  . Highest education level: Not on file  Occupational History  . Not on file  Social Needs  . Financial resource strain: Not on file  . Food insecurity:    Worry: Not on file    Inability: Not on file  . Transportation needs:    Medical: Not on file    Non-medical: Not on file  Tobacco Use  . Smoking status: Never Smoker  . Smokeless tobacco: Never Used  Substance and Sexual Activity  . Alcohol use: No    Alcohol/week: 0.0 standard drinks  . Drug use: No  . Sexual activity: Not on file  Lifestyle  . Physical activity:    Days per week: Not on file    Minutes per session: Not on file  . Stress: Not on file  Relationships  . Social connections:    Talks on phone: Not on file    Gets together: Not on file    Attends religious service: Not on file    Active member of  club or organization: Not on file    Attends meetings of clubs or organizations: Not on file    Relationship status: Not on file  . Intimate partner violence:    Fear of current or ex partner: Not on file    Emotionally abused: Not on file    Physically abused: Not on file    Forced sexual activity: Not on file  Other Topics Concern  . Not on file  Social History Narrative   From [place], moved to [place] for her ill daughter, moved to [place], moved back to [place].   Married. Retired.   Lives in [place].   Enjoys babysitting, writing, reading, cooking.       Review of Systems: See HPI, otherwise negative ROS  Physical Exam: BP (!) 145/73   Temp 98 F (36.7 C) (Tympanic)   Resp 18   Ht 4' 11.25" (1.505 m)   Wt 88.5 kg   SpO2 98%   BMI 39.05 kg/m  General:   Alert,  pleasant and cooperative in NAD Head:  Normocephalic and atraumatic. Neck:  Supple; no masses or thyromegaly. Lungs:  Clear throughout to auscultation, normal respiratory  effort.    Heart:  +S1, +S2, Regular rate and rhythm, No edema. Abdomen:  Soft, nontender and nondistended. Normal bowel sounds, without guarding, and without rebound.   Neurologic:  Alert and  oriented x4;  grossly normal neurologically.  Impression/Plan: Lindsay Snyder is here for an colonoscopy to be performed for surveillance due to prior history of colon polyps   Risks, benefits, limitations, and alternatives regarding  colonoscopy have been reviewed with the patient.  Questions have been answered.  All parties agreeable.   Lindsay Bellows, MD  12/16/2017, 7:35 AM

## 2017-12-17 LAB — SURGICAL PATHOLOGY

## 2017-12-20 ENCOUNTER — Encounter: Payer: Self-pay | Admitting: Primary Care

## 2017-12-20 ENCOUNTER — Ambulatory Visit (INDEPENDENT_AMBULATORY_CARE_PROVIDER_SITE_OTHER): Payer: Medicare Other | Admitting: Primary Care

## 2017-12-20 VITALS — BP 118/72 | HR 78 | Temp 98.2°F | Ht 59.25 in | Wt 211.8 lb

## 2017-12-20 DIAGNOSIS — R1900 Intra-abdominal and pelvic swelling, mass and lump, unspecified site: Secondary | ICD-10-CM | POA: Diagnosis not present

## 2017-12-20 DIAGNOSIS — N898 Other specified noninflammatory disorders of vagina: Secondary | ICD-10-CM

## 2017-12-20 NOTE — Patient Instructions (Signed)
We will be in touch with your vaginal swab results once received.  Stop by the front desk and speak with either Rosaria Ferries or Anastasiya regarding your ultrasound.  It was a pleasure to see you today!

## 2017-12-20 NOTE — Progress Notes (Signed)
Subjective:    Patient ID: Lindsay Snyder, female    DOB: 1945-02-15, 72 y.o.   MRN: [id number]  HPI  Lindsay Snyder is a 72 year old female who presents today with a chief complaint of vaginal itching.  This has occurred intermittently for the last several weeks. She's applied some Monistat to the inside of her vagina and felt different textures and a ball in her vagina. The texture felt like wet popcorn. She once used baby powder to the vagina for years, heard that there is a correlation to this and cervical cancer.   She does endorse lifting heavy baskets of laundry often, overdoes herself. She denies vaginal discharge, dysuria, hematuria.   Review of Systems  Constitutional: Negative for fever and unexpected weight change.  Gastrointestinal: Negative for abdominal pain.  Genitourinary: Negative for dysuria, frequency, hematuria, pelvic pain and vaginal discharge.       Vaginal itching       Past Medical History:  Diagnosis Date  . Appendicitis 8   . Polyp of colon    Benign     Social History   Socioeconomic History  . Marital status: Married    Spouse name: Not on file  . Number of children: Not on file  . Years of education: Not on file  . Highest education level: Not on file  Occupational History  . Not on file  Social Needs  . Financial resource strain: Not on file  . Food insecurity:    Worry: Not on file    Inability: Not on file  . Transportation needs:    Medical: Not on file    Non-medical: Not on file  Tobacco Use  . Smoking status: Never Smoker  . Smokeless tobacco: Never Used  Substance and Sexual Activity  . Alcohol use: No    Alcohol/week: 0.0 standard drinks  . Drug use: No  . Sexual activity: Not on file  Lifestyle  . Physical activity:    Days per week: Not on file    Minutes per session: Not on file  . Stress: Not on file  Relationships  . Social connections:    Talks on phone: Not on file    Gets together: Not on file    Attends  religious service: Not on file    Active member of club or organization: Not on file    Attends meetings of clubs or organizations: Not on file    Relationship status: Not on file  . Intimate partner violence:    Fear of current or ex partner: Not on file    Emotionally abused: Not on file    Physically abused: Not on file    Forced sexual activity: Not on file  Other Topics Concern  . Not on file  Social History Narrative   From [place], moved to [place] for her ill daughter, moved to [place], moved back to [place].   Married. Retired.   Lives in [place].   Enjoys babysitting, writing, reading, cooking.       Past Surgical History:  Procedure Laterality Date  . ABDOMINAL HYSTERECTOMY    . APPENDECTOMY    . BREAST EXCISIONAL BIOPSY Right    + 20 years neg    Family History  Problem Relation Age of Onset  . Diabetes Mother        Deceased  . Heart attack Father 14       Deceased  . Cancer Father  Colon  . Cancer Sister        Unusual blood cancer  . Breast cancer Paternal Aunt     Allergies  Allergen Reactions  . Tylenol [Acetaminophen] Rash    Current Outpatient Medications on File Prior to Visit  Medication Sig Dispense Refill  . Calcium Carbonate 500 MG CHEW Chew by mouth.    . Cholecalciferol (VITAMIN D3) 1000 units CAPS Take by mouth.    . Magnesium Gluconate (MAGNESIUM 27) 500 (27 Mg) MG TABS     . Omega-3 Fatty Acids (FISH OIL PO) Take 2 capsules by mouth daily.    Marland Kitchen OVER THE COUNTER MEDICATION Take 2 capsules by mouth daily.    . traZODone (DESYREL) 150 MG tablet Take 1 tablet (150 mg total) by mouth at bedtime as needed for sleep. 90 tablet 1  . triamcinolone cream (KENALOG) 0.1 % Apply 1 application topically 2 (two) times daily. 15 g 0   No current facility-administered medications on file prior to visit.     BP 118/72   Pulse 78   Temp 98.2 F (36.8 C) (Oral)   Ht 4' 11.25" (1.505 m)   Wt 211 lb 12 oz (96 kg)   SpO2 99%   BMI 42.41 kg/m      Objective:   Physical Exam  Constitutional: She appears well-nourished.  Neck: Neck supple.  Cardiovascular: Normal rate and regular rhythm.  Respiratory: Effort normal and breath sounds normal.  Genitourinary: There is no rash, tenderness or lesion on the right labia. There is no rash, tenderness or lesion on the left labia. There is erythema in the vagina. No vaginal discharge found.  Genitourinary Comments: No masses, lesions, or vaginal bleeding/discharge. Vaginal atrophy. No cervix. Cystocele.   Skin: Skin is warm and dry.           Assessment & Plan:  Vaginal Itching:  Also with fullness sensation in lower pelvis.  Pelvic exam today without obvious masses/lesions/discharge. Evidence of cystocele.  Reassurance provided today that the likelihood of cancer is low.  Wet prep sent off for testing. Ultrasound of the pelvis/vagina pending.  Pleas Koch, NP

## 2017-12-21 LAB — WET PREP BY MOLECULAR PROBE
Candida species: NOT DETECTED
Gardnerella vaginalis: NOT DETECTED
MICRO NUMBER: 91411448
SPECIMEN QUALITY:: ADEQUATE
Trichomonas vaginosis: NOT DETECTED

## 2017-12-23 ENCOUNTER — Encounter: Payer: Self-pay | Admitting: Gastroenterology

## 2017-12-25 ENCOUNTER — Ambulatory Visit
Admission: RE | Admit: 2017-12-25 | Discharge: 2017-12-25 | Disposition: A | Discharge status: Home / Self Care | Payer: Medicare Other | Source: Ambulatory Visit | Attending: Primary Care | Admitting: Primary Care

## 2017-12-25 DIAGNOSIS — Z90710 Acquired absence of both cervix and uterus: Secondary | ICD-10-CM | POA: Diagnosis not present

## 2017-12-25 DIAGNOSIS — R1900 Intra-abdominal and pelvic swelling, mass and lump, unspecified site: Secondary | ICD-10-CM | POA: Diagnosis not present

## 2018-06-12 ENCOUNTER — Other Ambulatory Visit: Payer: Self-pay | Admitting: Primary Care

## 2018-06-16 ENCOUNTER — Other Ambulatory Visit: Payer: Self-pay | Admitting: Primary Care

## 2018-06-16 DIAGNOSIS — Z1231 Encounter for screening mammogram for malignant neoplasm of breast: Secondary | ICD-10-CM

## 2018-07-15 ENCOUNTER — Telehealth: Payer: Self-pay | Admitting: Primary Care

## 2018-07-15 DIAGNOSIS — E782 Mixed hyperlipidemia: Secondary | ICD-10-CM

## 2018-07-15 NOTE — Telephone Encounter (Signed)
Noted, orders placed. 

## 2018-07-15 NOTE — Telephone Encounter (Signed)
Please enter annual lab orders. Pt coming in 6.18.20  Thanks

## 2018-07-17 ENCOUNTER — Other Ambulatory Visit (INDEPENDENT_AMBULATORY_CARE_PROVIDER_SITE_OTHER): Payer: Medicare Other

## 2018-07-17 ENCOUNTER — Other Ambulatory Visit: Payer: Self-pay

## 2018-07-17 ENCOUNTER — Ambulatory Visit (INDEPENDENT_AMBULATORY_CARE_PROVIDER_SITE_OTHER): Payer: Medicare Other

## 2018-07-17 DIAGNOSIS — Z0000 Encounter for general adult medical examination without abnormal findings: Secondary | ICD-10-CM

## 2018-07-17 DIAGNOSIS — E782 Mixed hyperlipidemia: Secondary | ICD-10-CM | POA: Diagnosis not present

## 2018-07-17 LAB — LIPID PANEL
Cholesterol: 236 mg/dL — ABNORMAL HIGH (ref 0–200)
HDL: 50.4 mg/dL (ref >39.00)
LDL Cholesterol: 150 mg/dL — ABNORMAL HIGH (ref 0–99)
NonHDL: 185.77
Total CHOL/HDL Ratio: 5
Triglycerides: 180 mg/dL — ABNORMAL HIGH (ref 0.0–149.0)
VLDL: 36 mg/dL (ref 0.0–40.0)

## 2018-07-17 LAB — COMPREHENSIVE METABOLIC PANEL
ALT: 13 U/L (ref 0–35)
AST: 16 U/L (ref 0–37)
Albumin: 4.1 g/dL (ref 3.5–5.2)
Alkaline Phosphatase: 65 U/L (ref 39–117)
BUN: 18 mg/dL (ref 6–23)
CO2: 29 mEq/L (ref 19–32)
Calcium: 9.5 mg/dL (ref 8.4–10.5)
Chloride: 105 mEq/L (ref 96–112)
Creatinine, Ser: 0.83 mg/dL (ref 0.40–1.20)
GFR: 67.21 mL/min (ref >60.00)
Glucose, Bld: 92 mg/dL (ref 70–99)
Potassium: 4.5 mEq/L (ref 3.5–5.1)
Sodium: 140 mEq/L (ref 135–145)
Total Bilirubin: 0.9 mg/dL (ref 0.2–1.2)
Total Protein: 6.3 g/dL (ref 6.0–8.3)

## 2018-07-17 NOTE — Progress Notes (Signed)
Subjective:   Lindsay Snyder is a 73 y.o. female who presents for Medicare Annual (Subsequent) preventive examination.  Review of Systems:  N/A Cardiac Risk Factors include: advanced age (>12men, >24 women);obesity (BMI >30kg/m2)     Objective:     Vitals: There were no vitals taken for this visit.  There is no height or weight on file to calculate BMI.  Advanced Directives 07/17/2018 12/16/2017 07/08/2017 11/13/2016 06/29/2016 04/12/2015  Does Patient Have a Medical Advance Directive? Yes No Yes Yes Yes No  Type of Paramedic of [place];Living will - [place];Living will - [place] -  Does patient want to make changes to medical advance directive? No - Patient declined - - - - -  Copy of [place] in Chart? No - copy requested - Yes - No - copy requested -  Would patient like information on creating a medical advance directive? - - No - Patient declined - - -    Tobacco Social History   Tobacco Use  Smoking Status Never Smoker  Smokeless Tobacco Never Used     Counseling given: No   Clinical Intake:  Pre-visit preparation completed: Yes  Pain : No/denies pain Pain Score: 0-No pain     Nutritional Status: BMI > 30  Obese Nutritional Risks: None  How often do you need to have someone help you when you read instructions, pamphlets, or other written materials from your doctor or pharmacy?: 1 - Never What is the last grade level you completed in school?: 12th grade  Interpreter Needed?: No  Comments: pt lives with spouse Information entered by :: LPinson, RN  Past Medical History:  Diagnosis Date  . Appendicitis 8   . Polyp of colon    Benign   Past Surgical History:  Procedure Laterality Date  . ABDOMINAL HYSTERECTOMY    . APPENDECTOMY    . BREAST EXCISIONAL BIOPSY Right    + 20 years neg  . COLONOSCOPY WITH PROPOFOL N/A 12/16/2017   Procedure: COLONOSCOPY WITH PROPOFOL;   Surgeon: Jonathon Bellows, MD;  [facility] END[facility];  Service: Gastroenterology;  Laterality: N/A;   Family History  Problem Relation Age of Onset  . Diabetes Mother        Deceased  . Heart attack Father 29       Deceased  . Cancer Father        Colon  . Cancer Sister        Unusual blood cancer  . Breast cancer Paternal Aunt    Social History   Socioeconomic History  . Marital status: Married    Spouse name: Not on file  . Number of children: Not on file  . Years of education: Not on file  . Highest education level: Not on file  Occupational History  . Not on file  Social Needs  . Financial resource strain: Not on file  . Food insecurity    Worry: Not on file    Inability: Not on file  . Transportation needs    Medical: Not on file    Non-medical: Not on file  Tobacco Use  . Smoking status: Never Smoker  . Smokeless tobacco: Never Used  Substance and Sexual Activity  . Alcohol use: No    Alcohol/week: 0.0 standard drinks  . Drug use: No  . Sexual activity: Not Currently  Lifestyle  . Physical activity    Days per week: Not on file  Minutes per session: Not on file  . Stress: Not on file  Relationships  . Social Herbalist on phone: Not on file    Gets together: Not on file    Attends religious service: Not on file    Active member of club or organization: Not on file    Attends meetings of clubs or organizations: Not on file    Relationship status: Not on file  Other Topics Concern  . Not on file  Social History Narrative   From [place], moved to [place] for her ill daughter, moved to [place], moved back to [place].   Married. Retired.   Lives in [place].   Enjoys babysitting, writing, reading, cooking.       Outpatient Encounter Medications as of 07/17/2018  Medication Sig  . CALCIUM PO Take by mouth. Osteovin  . Cholecalciferol (VITAMIN D3) 1000 units CAPS Take by mouth.  . Magnesium Gluconate (MAGNESIUM 27) 500 (27 Mg) MG TABS as needed.    . Multiple Vitamins-Minerals (MULTIVITAMIN ADULT PO) Take by mouth. Neuro Vite  . triamcinolone cream (KENALOG) 0.1 % Apply 1 application topically 2 (two) times daily. (Patient taking differently: Apply 1 application topically as needed. )  . [DISCONTINUED] Calcium Carbonate 500 MG CHEW Chew by mouth.  . [DISCONTINUED] Omega-3 Fatty Acids (FISH OIL PO) Take 2 capsules by mouth daily.  . [DISCONTINUED] OVER THE COUNTER MEDICATION Take 2 capsules by mouth daily.  . [DISCONTINUED] traZODone (DESYREL) 150 MG tablet Take 1 tablet (150 mg total) by mouth at bedtime as needed for sleep.   No facility-administered encounter medications on file as of 07/17/2018.     Activities of Daily Living In your present state of health, do you have any difficulty performing the following activities: 07/17/2018  Hearing? N  Vision? N  Difficulty concentrating or making decisions? N  Walking or climbing stairs? N  Dressing or bathing? N  Doing errands, shopping? N  Preparing Food and eating ? N  Using the Toilet? N  In the past six months, have you accidently leaked urine? N  Do you have problems with loss of bowel control? N  Managing your Medications? N  Managing your Finances? N  Housekeeping or managing your Housekeeping? N  Some recent data might be hidden    Patient Care Team: Pleas Koch, NP as PCP - General (Nurse Practitioner)    Assessment:   This is a routine wellness examination for Lindsay Snyder.   Hearing Screening   125Hz  250Hz  500Hz  1000Hz  2000Hz  3000Hz  4000Hz  6000Hz  8000Hz   Right ear:           Left ear:           Vision Screening Comments: Vision exam in June 2020 @ [facility]   Exercise Activities and Dietary recommendations Current Exercise Habits: The patient does not participate in regular exercise at present, Exercise limited by: None identified  Goals    . Patient Stated     Starting 07/17/2018, I will continue to take medications as prescribed.        Fall Risk  Fall Risk  07/17/2018 07/08/2017 06/29/2016 05/18/2015 05/18/2015  Falls in the past year? 0 No No No No  Risk for fall due to : - - - - -  Risk for fall due to: Comment - - - - -   Depression Screen PHQ 2/9 Scores 07/17/2018 07/08/2017 06/29/2016 05/18/2015  PHQ - 2 Score 0 0 0 1  PHQ- 9 Score 0  0 - -     Cognitive Function MMSE - Mini Mental State Exam 07/17/2018 07/08/2017 06/29/2016  Orientation to time 5 5 5   Orientation to Place 5 5 5   Registration 3 3 3   Attention/ Calculation 0 0 0  Recall 3 2 3   Language- name 2 objects 0 0 0  Language- repeat 1 1 1   Language- follow 3 step command 0 3 3  Language- read & follow direction 0 0 0  Write a sentence 0 0 0  Copy design 0 0 0  Total score 17 19 20      PLEASE NOTE: A Mini-Cog screen was completed. Maximum score is 17. A value of 0 denotes this part of [place] MMSE was not completed or the patient failed this part of the Mini-Cog screening.   Mini-Cog Screening Orientation to Time - Max 5 pts Orientation to Place - Max 5 pts Registration - Max 3 pts Recall - Max 3 pts Language Repeat - Max 1 pts      Immunization History  Administered Date(s) Administered  . Pneumococcal Conjugate-13 05/18/2015  . Pneumococcal Polysaccharide-23 08/04/2012  . Tdap 05/17/2014     Screening Tests Health Maintenance  Topic Date Due  . INFLUENZA VACCINE  08/30/2018  . MAMMOGRAM  07/23/2019  . COLONOSCOPY  12/16/2020  . TETANUS/TDAP  05/16/2024  . DEXA SCAN  Completed  . Hepatitis C Screening  Completed  . PNA vac Low Risk Adult  Completed     Plan:     I have personally reviewed, addressed, and noted the following in the patient's chart:  A. Medical and social history B. Use of alcohol, tobacco or illicit drugs  C. Current medications and supplements D. Functional ability and status E.  Nutritional status F.  Physical activity G. Advance directives H. List of other physicians I.  Hospitalizations, surgeries, and ER visits in  previous 12 months J.  Vitals (unless it is a telemedicine encounter) K. Screenings to include cognitive, depression, hearing, vision (NOTE: hearing and vision screenings not completed in telemedicine encounter) L. Referrals and appointments   In addition, I have reviewed and discussed with patient certain preventive protocols, quality metrics, and best practice recommendations. A written personalized care plan for preventive services and recommendations were provided to patient.  With patient's permission, we connected on 07/18/18 at 10:00 AM EDT. Interactive audio and video telecommunications were attempted with patient. This attempt was unsuccessful due to patient having technical difficulties OR patient did not have access to video capability.  Encounter was completed with audio only.  Two patient identifiers were used to ensure the encounter occurred with the correct person. Patient was in home and writer was in office.     Signed,   Lindell Noe, MHA, BS, RN Health Coach

## 2018-07-18 DIAGNOSIS — D3132 Benign neoplasm of left choroid: Secondary | ICD-10-CM | POA: Diagnosis not present

## 2018-07-18 NOTE — Progress Notes (Signed)
PCP notes:   Health maintenance:  Mammogram - scheduled  Abnormal screenings:   None  Patient concerns:   None  Nurse concerns:  None  Next PCP appt:   07/21/18 @ 0900

## 2018-07-21 ENCOUNTER — Ambulatory Visit (INDEPENDENT_AMBULATORY_CARE_PROVIDER_SITE_OTHER): Payer: Medicare Other | Admitting: Primary Care

## 2018-07-21 DIAGNOSIS — F5102 Adjustment insomnia: Secondary | ICD-10-CM

## 2018-07-21 DIAGNOSIS — M8580 Other specified disorders of bone density and structure, unspecified site: Secondary | ICD-10-CM

## 2018-07-21 DIAGNOSIS — E782 Mixed hyperlipidemia: Secondary | ICD-10-CM

## 2018-07-21 DIAGNOSIS — E669 Obesity, unspecified: Secondary | ICD-10-CM

## 2018-07-21 MED ORDER — SIMVASTATIN 40 MG PO TABS: 40.0000 mg | ORAL_TABLET | Freq: Every day | ORAL | 0 refills | Status: DC

## 2018-07-21 MED ORDER — HYDROXYZINE HCL 10 MG PO TABS: 10.0000 mg | ORAL_TABLET | Freq: Every day | ORAL | 0 refills | Status: DC

## 2018-07-21 NOTE — Assessment & Plan Note (Signed)
Discussed the importance of a healthy diet and regular exercise in order for weight loss, and to reduce the risk of any potential medical problems.  

## 2018-07-21 NOTE — Assessment & Plan Note (Signed)
Trazodone ineffective, no improvement with OTC treatment. Would like to avoid benzos and medications such as Ambien in the elderly.   Given underlying anxiety, will treat with low dose hydroxyzine. She will update.

## 2018-07-21 NOTE — Assessment & Plan Note (Signed)
Compliant to calcium and vitamin D. Repeat bone density scan due in 2021. Encouraged weight bearing exercise and weight loss.

## 2018-07-21 NOTE — Progress Notes (Signed)
Subjective:    Patient ID: Lindsay Snyder, female    DOB: [birth year], 73 y.o.   MRN: [id number]  HPI     Lindsay Snyder - 73 y.o. female  MRN [id number]  Date of Birth: 1945-12-30  PCP: Pleas Koch, NP  This service was provided via telemedicine. Phone Visit performed on 07/21/2018    Rationale for phone visit along with limitations reviewed. Patient consented to telephone encounter.    Location of patient: Home Location of provider: Office [place] [facility] Name of referring provider: N/A   Names of persons and role in encounter: Provider: Pleas Koch, NP  Patient: Lindsay Snyder  Other: N/A   Time on call: 17 min - 43 sec   Subjective: No chief complaint on file.    HPI:  Lindsay Snyder is a 73 year old female who presents today for follow up of chronic conditions. Mammogram is scheduled for Monday next week. Colonoscopy due in 2022.  She is currently not managed on anything for cholesterol. Had petechiae and myalgias with Crestor in the past. She endorses a fair diet. Recent lipid panel with LDL of 150 which is a decrease from 169 in 2019. She was taking herbal supplements but has not done so for several months.   She is under a lot of stress and is not sleeping well due to personal stress with her husband. She was once managed on Trazodone which was helpful temporarily but then became ineffective. Current symptoms include "nervousness", easily stressed, difficulty sleeping, tearfulness.   She goes to bed around 9-10:30 pm, will wake 2-3 times during the night, falls back asleep sometimes until around 5 am when she gets up. GAD 7 score of 8 and PHQ 9 score of 9 today. Her main concern is getting a good nights sleep. She's tried natural OTC medications without improvement. She does not want anxiety treatment and does not wish to speak with a therapist.   BP Readings from Last 3 Encounters:  12/20/17 118/72  12/16/17 130/70  12/03/17 120/76   The 10-year  ASCVD risk score Mikey Bussing DC Jr., et al., 2013) is: 11.6%   Values used to calculate the score:     Age: 79 years     Sex: Female     Is Non-Hispanic African American: No     Diabetic: No     Tobacco smoker: No     Systolic Blood Pressure: 812 mmHg     Is BP treated: No     HDL Cholesterol: 50.4 mg/dL     Total Cholesterol: 236 mg/dL   Objective/Observations:   No physical exam or vital signs collected unless specifically identified below.   There were no vitals taken for this visit.   Respiratory status: speaks in complete sentences without evident shortness of breath.   Assessment/Plan:  See problem based charting.  No problem-specific Assessment & Plan notes found for this encounter.   I discussed the assessment and treatment plan with the patient. The patient was provided an opportunity to ask questions and all were answered. The patient agreed with the plan and demonstrated an understanding of the instructions.  Lab Orders  No laboratory test(s) ordered today    No orders of the defined types were placed in this encounter.   The patient was advised to call back or seek an in-person evaluation if the symptoms worsen or if the condition fails to improve as anticipated.  Pleas Koch, NP  Review of Systems  Eyes: Negative for visual disturbance.  Respiratory: Negative for shortness of breath.   Cardiovascular: Negative for chest pain.  Neurological: Negative for dizziness and headaches.       Past Medical History:  Diagnosis Date  . Appendicitis 8   . Polyp of colon    Benign     Social History   Socioeconomic History  . Marital status: Married    Spouse name: Not on file  . Number of children: Not on file  . Years of education: Not on file  . Highest education level: Not on file  Occupational History  . Not on file  Social Needs  . Financial resource strain: Not on file  . Food insecurity    Worry: Not on file    Inability: Not on file   . Transportation needs    Medical: Not on file    Non-medical: Not on file  Tobacco Use  . Smoking status: Never Smoker  . Smokeless tobacco: Never Used  Substance and Sexual Activity  . Alcohol use: No    Alcohol/week: 0.0 standard drinks  . Drug use: No  . Sexual activity: Not Currently  Lifestyle  . Physical activity    Days per week: Not on file    Minutes per session: Not on file  . Stress: Not on file  Relationships  . Social Herbalist on phone: Not on file    Gets together: Not on file    Attends religious service: Not on file    Active member of club or organization: Not on file    Attends meetings of clubs or organizations: Not on file    Relationship status: Not on file  . Intimate partner violence    Fear of current or ex partner: Not on file    Emotionally abused: Not on file    Physically abused: Not on file    Forced sexual activity: Not on file  Other Topics Concern  . Not on file  Social History Narrative   From [place], moved to [place] for her ill daughter, moved to [place], moved back to [place].   Married. Retired.   Lives in [place].   Enjoys babysitting, writing, reading, cooking.       Past Surgical History:  Procedure Laterality Date  . ABDOMINAL HYSTERECTOMY    . APPENDECTOMY    . BREAST EXCISIONAL BIOPSY Right    + 20 years neg  . COLONOSCOPY WITH PROPOFOL N/A 12/16/2017   Procedure: COLONOSCOPY WITH PROPOFOL;  Surgeon: Jonathon Bellows, MD;  Location: [facility] ENDOSCOPY;  Service: Gastroenterology;  Laterality: N/A;    Family History  Problem Relation Age of Onset  . Diabetes Mother        Deceased  . Heart attack Father 46       Deceased  . Cancer Father        Colon  . Cancer Sister        Unusual blood cancer  . Breast cancer Paternal Aunt     Allergies  Allergen Reactions  . Crestor [Rosuvastatin Calcium] Rash and Other (See Comments)    Myalgia  . Tylenol [Acetaminophen] Rash    Current Outpatient Medications on File  Prior to Visit  Medication Sig Dispense Refill  . CALCIUM PO Take by mouth. Osteovin    . Cholecalciferol (VITAMIN D3) 1000 units CAPS Take by mouth.    . Magnesium Gluconate (MAGNESIUM 27) 500 (27 Mg) MG TABS as needed.     Marland Kitchen  Multiple Vitamins-Minerals (MULTIVITAMIN ADULT PO) Take by mouth. Neuro Vite    . triamcinolone cream (KENALOG) 0.1 % Apply 1 application topically 2 (two) times daily. (Patient taking differently: Apply 1 application topically as needed. ) 15 g 0   No current facility-administered medications on file prior to visit.     There were no vitals taken for this visit.   Objective:   Physical Exam  Constitutional: She is oriented to person, place, and time. She appears well-nourished.  Respiratory: Effort normal. No respiratory distress.  Neurological: She is alert and oriented to person, place, and time.  Psychiatric: She has a normal mood and affect.           Assessment & Plan:

## 2018-07-21 NOTE — Assessment & Plan Note (Signed)
Slight improvement in LDL from 2019 but still above goal.  ASCVD risk score of 11%. She is open to trying another statin that is affordable. Rx for Simvastatin sent to pharmacy. She will update. We will repeat lipids in 2 months.

## 2018-07-21 NOTE — Patient Instructions (Signed)
Complete your mammogram as scheduled.  Start exercising. You should be getting 150 minutes of exercise weekly.  It's important to improve your diet by reducing consumption of fast food, fried food, processed snack foods, sugary drinks. Increase consumption of fresh vegetables and fruits, whole grains, water.  Ensure you are drinking 64 ounces of water daily.  Try the hydroxyzine tablets at bedtime for sleep as discussed. Please update me in a few weeks.  Start simvastatin medication for cholesterol, please update me in a few weeks as discussed.  It was a pleasure to see you today!

## 2018-07-28 ENCOUNTER — Ambulatory Visit
Admission: RE | Admit: 2018-07-28 | Discharge: 2018-07-28 | Disposition: A | Discharge status: Home / Self Care | Payer: Medicare Other | Source: Ambulatory Visit | Attending: Primary Care | Admitting: Primary Care

## 2018-07-28 ENCOUNTER — Other Ambulatory Visit: Payer: Self-pay

## 2018-07-28 DIAGNOSIS — Z1231 Encounter for screening mammogram for malignant neoplasm of breast: Secondary | ICD-10-CM | POA: Insufficient documentation

## 2018-07-28 NOTE — Progress Notes (Signed)
I reviewed health advisor's note, was available for consultation, and agree with documentation and plan.  

## 2018-08-18 ENCOUNTER — Other Ambulatory Visit: Payer: Self-pay | Admitting: Primary Care

## 2018-08-18 DIAGNOSIS — F5102 Adjustment insomnia: Secondary | ICD-10-CM

## 2018-08-18 NOTE — Telephone Encounter (Signed)
Spoke with patient. Patient states she has been taking 1 tablet at bedtime and it did not really help much, less than 10% improvement. She tried for 2 nights taking 2 tablets and the first night it helped a lot but the second night did not really notice a difference. Patient states if provider thinks she needs to try longer than she will continue taking otherwise she is willing to try something different. Patient aware that provider is out of the office until Wednesday

## 2018-08-18 NOTE — Telephone Encounter (Signed)
Called patient to follow up on how she is doing on Hydroxyzine. After that will send for review to refill to PCP

## 2018-08-18 NOTE — Telephone Encounter (Signed)
Will defer this to PCP 

## 2018-08-20 NOTE — Telephone Encounter (Signed)
Please notify patient that I increase the dose of her hydroxyzine to 25 mg.  She can try taking 1 to 2 tablets by mouth at bedtime for sleep.  Have her update me in 1 to 2 weeks.

## 2018-08-21 NOTE — Telephone Encounter (Signed)
Per DPR, left detail message of Kate Clark's comments. 

## 2018-09-10 ENCOUNTER — Other Ambulatory Visit: Payer: Self-pay

## 2018-09-10 DIAGNOSIS — F5102 Adjustment insomnia: Secondary | ICD-10-CM

## 2018-09-11 MED ORDER — HYDROXYZINE HCL 25 MG PO TABS: 50.0000 mg | ORAL_TABLET | Freq: Every evening | ORAL | 0 refills | Status: DC | PRN

## 2018-09-11 NOTE — Telephone Encounter (Signed)
Noted.  Refill sent to pharmacy. 

## 2018-09-11 NOTE — Telephone Encounter (Signed)
Last prescribed on 08/20/2018. Last appointment on 07/21/2018. No future appointment

## 2018-10-08 DIAGNOSIS — H1032 Unspecified acute conjunctivitis, left eye: Secondary | ICD-10-CM | POA: Diagnosis not present

## 2018-11-27 ENCOUNTER — Other Ambulatory Visit: Payer: Self-pay

## 2018-11-27 DIAGNOSIS — F5102 Adjustment insomnia: Secondary | ICD-10-CM

## 2018-11-27 MED ORDER — HYDROXYZINE HCL 25 MG PO TABS: 50.0000 mg | ORAL_TABLET | Freq: Every evening | ORAL | 0 refills | Status: DC | PRN

## 2019-02-24 ENCOUNTER — Telehealth: Payer: Self-pay | Admitting: Primary Care

## 2019-02-24 DIAGNOSIS — F5102 Adjustment insomnia: Secondary | ICD-10-CM

## 2019-02-24 MED ORDER — HYDROXYZINE HCL 25 MG PO TABS: 50.0000 mg | ORAL_TABLET | Freq: Every evening | ORAL | 0 refills | Status: DC | PRN

## 2019-02-24 NOTE — Telephone Encounter (Signed)
Pt just called saying she got a message saying medication was ready for pick up at [place]. It should have been sent to [facility] in [place] at [address]. [employer].

## 2019-02-24 NOTE — Telephone Encounter (Signed)
Called [facility] and pharmacist told me they will cancel all the refills of hydroxyzine.  Left detail message for patient that refill was since already to [facility]

## 2019-02-24 NOTE — Telephone Encounter (Signed)
Pt is requesting a refill of Hydroxyzine to be sent to [facility] in [place] at [address]

## 2019-02-24 NOTE — Telephone Encounter (Signed)
Noted. Refill as requested.

## 2019-02-26 ENCOUNTER — Ambulatory Visit (INDEPENDENT_AMBULATORY_CARE_PROVIDER_SITE_OTHER): Payer: Medicare Other | Admitting: Primary Care

## 2019-02-26 ENCOUNTER — Other Ambulatory Visit: Payer: Self-pay

## 2019-02-26 ENCOUNTER — Other Ambulatory Visit (INDEPENDENT_AMBULATORY_CARE_PROVIDER_SITE_OTHER): Payer: Medicare Other

## 2019-02-26 DIAGNOSIS — N898 Other specified noninflammatory disorders of vagina: Secondary | ICD-10-CM | POA: Diagnosis not present

## 2019-02-26 DIAGNOSIS — K1370 Unspecified lesions of oral mucosa: Secondary | ICD-10-CM | POA: Diagnosis not present

## 2019-02-26 LAB — CBC
HCT: 45.9 % (ref 36.0–46.0)
Hemoglobin: 15.4 g/dL — ABNORMAL HIGH (ref 12.0–15.0)
MCHC: 33.5 g/dL (ref 30.0–36.0)
MCV: 87.4 fl (ref 78.0–100.0)
Platelets: 261 10*3/uL (ref 150.0–400.0)
RBC: 5.26 Mil/uL — ABNORMAL HIGH (ref 3.87–5.11)
RDW: 13 % (ref 11.5–15.5)
WBC: 7.9 10*3/uL (ref 4.0–10.5)

## 2019-02-26 NOTE — Assessment & Plan Note (Signed)
Unable to evaluate the sores to her gumline for which she's referring. She seems mostly concerned about STD testing for syphilis. Labs pending. Consider having her in the office in person if sores persist and labs are negative.

## 2019-02-26 NOTE — Progress Notes (Signed)
Subjective:    Patient ID: Lindsay Snyder, female    DOB: 1945/03/19, 74 y.o.   MRN: [id number]  HPI  Virtual Visit via Video Note  I connected with Lindsay Snyder on 02/26/19 at  9:00 AM EST by a video enabled telemedicine application and verified that I am speaking with the correct person using two identifiers.  Location: Patient: Home Provider: Office   I discussed the limitations of evaluation and management by telemedicine and the availability of in person appointments. The patient expressed understanding and agreed to proceed.  History of Present Illness:  Lindsay Snyder is a 74 year old female with a history of hyperlipidemia, insomnia, hemorrhoids who presents today with a chief complaint of oral sores.  She's had recent symptoms of sinus pressure with post nasal drip over the last one week. She's noticed a "sizzling sound" to her nose. She was brushing her teeth yesterday morning and then noticed a scant amount of blood in the sink from her mouth. She then looked into her oral cavity and noticed several raised painless, flesh colored "balls"' to the gumline bilaterally to the lower gums. She also noticed a "wart" like lesion to her labia minora yesterday.   She was reading on Google after searching for causes of oral sores and she found information regarding Syphilis. Years ago she was married to a man for whom she later found out that he was having sexual relationships with other men during their marriage. She is worried that she may have contracted syphilis from her prior husband but has never been tested.   Her sinus pressure and rhinorrhea have started to improve today. She denies fevers, loss of taste/smell, exposure to Covid-19.   Observations/Objective:  Alert and oriented. Appears well, not sickly. Speaking in complete sentences. Appears upset and worried today.  Assessment and Plan:  Unable to evaluate the sores to her gumline for which she's referring. She seems  mostly concerned about STD testing for syphilis. Labs pending. Consider having her in the office in person if sores persist and labs are negative.  Follow Up Instructions:  Call the main line for the lab appointment.  I'll be in touch with labs soon.  Lindsay Bossier, NP-C    I discussed the assessment and treatment plan with the patient. The patient was provided an opportunity to ask questions and all were answered. The patient agreed with the plan and demonstrated an understanding of the instructions.   The patient was advised to call back or seek an in-person evaluation if the symptoms worsen or if the condition fails to improve as anticipated.     Lindsay Koch, NP    Review of Systems  Constitutional: Negative for fever.  HENT: Positive for congestion, postnasal drip and sinus pressure.        Oral bumps  Respiratory: Negative for cough.   Allergic/Immunologic: Positive for environmental allergies.       Past Medical History:  Diagnosis Date  . Appendicitis 8   . Polyp of colon    Benign     Social History   Socioeconomic History  . Marital status: Married    Spouse name: Not on file  . Number of children: Not on file  . Years of education: Not on file  . Highest education level: Not on file  Occupational History  . Not on file  Tobacco Use  . Smoking status: Never Smoker  . Smokeless tobacco: Never Used  Substance and Sexual Activity  .  Alcohol use: No    Alcohol/week: 0.0 standard drinks  . Drug use: No  . Sexual activity: Not Currently  Other Topics Concern  . Not on file  Social History Narrative   From [place], moved to [place] for her ill daughter, moved to [place], moved back to [place].   Married. Retired.   Lives in [place].   Enjoys babysitting, writing, reading, cooking.      Social Determinants of Health   Financial Resource Strain:   . Difficulty of Paying Living Expenses: Not on file  Food Insecurity:   . Worried About Sales executive in the Last Year: Not on file  . Ran Out of Food in the Last Year: Not on file  Transportation Needs:   . Lack of Transportation (Medical): Not on file  . Lack of Transportation (Non-Medical): Not on file  Physical Activity:   . Days of Exercise per Week: Not on file  . Minutes of Exercise per Session: Not on file  Stress:   . Feeling of Stress : Not on file  Social Connections:   . Frequency of Communication with Friends and Family: Not on file  . Frequency of Social Gatherings with Friends and Family: Not on file  . Attends Religious Services: Not on file  . Active Member of Clubs or Organizations: Not on file  . Attends Archivist Meetings: Not on file  . Marital Status: Not on file  Intimate Partner Violence:   . Fear of Current or Ex-Partner: Not on file  . Emotionally Abused: Not on file  . Physically Abused: Not on file  . Sexually Abused: Not on file    Past Surgical History:  Procedure Laterality Date  . ABDOMINAL HYSTERECTOMY    . APPENDECTOMY    . BREAST EXCISIONAL BIOPSY Right    + 20 years neg  . COLONOSCOPY WITH PROPOFOL N/A 12/16/2017   Procedure: COLONOSCOPY WITH PROPOFOL;  Surgeon: Jonathon Bellows, MD;  Location: [facility] ENDOSCOPY;  Service: Gastroenterology;  Laterality: N/A;    Family History  Problem Relation Age of Onset  . Diabetes Mother        Deceased  . Heart attack Father 68       Deceased  . Cancer Father        Colon  . Cancer Sister        Unusual blood cancer  . Breast cancer Paternal Aunt     Allergies  Allergen Reactions  . Crestor [Rosuvastatin Calcium] Rash and Other (See Comments)    Myalgia  . Tylenol [Acetaminophen] Rash    Current Outpatient Medications on File Prior to Visit  Medication Sig Dispense Refill  . CALCIUM PO Take by mouth. Osteovin    . Cholecalciferol (VITAMIN D3) 1000 units CAPS Take by mouth.    . hydrOXYzine (ATARAX/VISTARIL) 25 MG tablet Take 2 tablets (50 mg total) by mouth at bedtime as needed  for anxiety. 180 tablet 0  . Magnesium Gluconate (MAGNESIUM 27) 500 (27 Mg) MG TABS as needed.     . Multiple Vitamins-Minerals (MULTIVITAMIN ADULT PO) Take by mouth. Neuro Vite    . simvastatin (ZOCOR) 40 MG tablet Take 1 tablet (40 mg total) by mouth daily. For cholesterol. 90 tablet 0  . triamcinolone cream (KENALOG) 0.1 % Apply 1 application topically 2 (two) times daily. (Patient taking differently: Apply 1 application topically as needed. ) 15 g 0   No current facility-administered medications on file prior to visit.  There were no vitals taken for this visit.   Objective:   Physical Exam  Constitutional: She is oriented to person, place, and time. She appears well-nourished.  Respiratory: Effort normal.  No cough  Neurological: She is alert and oriented to person, place, and time.  Psychiatric: She has a normal mood and affect.           Assessment & Plan:

## 2019-02-26 NOTE — Patient Instructions (Signed)
Call the main line for the lab appointment.  I'll be in touch with labs soon.  Allie Bossier, NP-C

## 2019-02-27 ENCOUNTER — Ambulatory Visit: Payer: Medicare Other | Admitting: Primary Care

## 2019-03-01 LAB — HSV(HERPES SIMPLEX VRS) I + II AB-IGG
HAV 1 IGG,TYPE SPECIFIC AB: <0.9 index
HSV 2 IGG,TYPE SPECIFIC AB: <0.9 index

## 2019-03-01 LAB — HSV 1/2 AB (IGM), IFA W/RFLX TITER
HSV 1 IgM Screen: NEGATIVE
HSV 2 IgM Screen: NEGATIVE

## 2019-03-01 LAB — HIV ANTIBODY (ROUTINE TESTING W REFLEX): HIV 1&2 Ab, 4th Generation: NONREACTIVE

## 2019-03-01 LAB — RPR: RPR Ser Ql: NONREACTIVE

## 2019-04-13 NOTE — Telephone Encounter (Signed)
Pt has a prod cough with yellow,Mcenaney or brown phlegm for 1 wk;pt having difficulty sleeping due to cough; OTC meds not helping; pt has chills, pt has not taken temp; pt does not feel warm, pt feels like congestion in sinus and throat and also has rt sided chest congestion. Pt feels sinus cavities above and below eyes feel congested and hurt. Pt said that the phlegm comes up constantly even during the night. Pt has a fine red rash on abdomen around waistline and some rash on chest that itches on and off; no blisters noted; pt isnot sure how long she has had the rash. No SOB and no abd pain. Pt has had diarrhea on and off but not sure when diarrhea started. Last time had watery diarrhea 2 days ago.no other covid symptoms noted, no travel and no known exposure to + covid.  Pt is not in any distress.pt wants to stop the phlegm. Pt scheduled virtual appt with [name] on 04/14/19 at 9:20.  Pt will have vitals ready when CMA calls.UC & ED precautions given and pt voiced understanding.

## 2019-04-13 NOTE — Telephone Encounter (Signed)
Noted, will evaluate. 

## 2019-04-14 ENCOUNTER — Other Ambulatory Visit: Payer: Self-pay

## 2019-04-14 ENCOUNTER — Ambulatory Visit (INDEPENDENT_AMBULATORY_CARE_PROVIDER_SITE_OTHER): Payer: Medicare Other | Admitting: Primary Care

## 2019-04-14 DIAGNOSIS — R0982 Postnasal drip: Secondary | ICD-10-CM | POA: Diagnosis not present

## 2019-04-14 NOTE — Assessment & Plan Note (Signed)
Suspect allergy involvement with post nasal drip and congestion. Could be viral component as her husband has similar symptoms two weeks ago.   Given that she's had improvement on the Robitussin Cough and Cold (suspect antihistamine is helping) we will continue with this regimen.   She will update later this week.

## 2019-04-14 NOTE — Progress Notes (Signed)
Subjective:    Patient ID: Lindsay Snyder, female    DOB: October 26, 1945, 74 y.o.   MRN: [id number]  HPI  Virtual Visit via Video Note  I connected with Lindsay Snyder on 04/14/19 at  9:20 AM EDT by a video enabled telemedicine application and verified that I am speaking with the correct person using two identifiers.  Location: Patient: Home Provider: Office   I discussed the limitations of evaluation and management by telemedicine and the availability of in person appointments. The patient expressed understanding and agreed to proceed.  History of Present Illness:  Ms. Bodin is a 74 year old female with a history of hyperlipidemia, murmur, osteopenia who presents today with a chief complaint of cough.  Symptoms include voice hoarseness, post nasal drip. Symptoms began about one week ago. Her most bothersome symptom is post nasal drip with throat congestion. She took a few doses of plain Robitussin last week without much improvement. She took Robitussin Cold and Cough (acetaminophen, dextromethorphan, doxylamine) in pill and liquid version yesterday and slept very well last night. She has not taken any today.  She denies fevers, loss of taste/smell, fatigue, diarrhea, known exposure. She is very careful in public, wears her mask. Her husband had similar symptoms two weeks ago, has since recovered.    Observations/Objective:  Alert and oriented. Appears well, not sickly. No distress. Speaking in complete sentences. Dry cough once during exam.  Assessment and Plan:  Suspect allergy involvement with post nasal drip and congestion. Could be viral component as her husband has similar symptoms two weeks ago.   Given that she's had improvement on the Robitussin Cough and Cold (suspect antihistamine is helping) we will continue with this regimen.   She will update later this week.   Follow Up Instructions:  Continue taking the Robitussin Cough and Cold medication that we discussed  on the video.  Take 2 pills in the morning, 2 pills in the afternoon, and then use the liquid at bedtime.  Please update me Friday this week as discussed.  It was a pleasure to see you today! Allie Bossier, NP-C    I discussed the assessment and treatment plan with the patient. The patient was provided an opportunity to ask questions and all were answered. The patient agreed with the plan and demonstrated an understanding of the instructions.   The patient was advised to call back or seek an in-person evaluation if the symptoms worsen or if the condition fails to improve as anticipated.    Pleas Koch, NP    Review of Systems  Constitutional: Negative for chills, fatigue and fever.  HENT: Positive for congestion and postnasal drip. Negative for sneezing.   Respiratory: Positive for cough.   Allergic/Immunologic: Positive for environmental allergies.       Past Medical History:  Diagnosis Date  . Appendicitis 8   . Polyp of colon    Benign     Social History   Socioeconomic History  . Marital status: Married    Spouse name: Not on file  . Number of children: Not on file  . Years of education: Not on file  . Highest education level: Not on file  Occupational History  . Not on file  Tobacco Use  . Smoking status: Never Smoker  . Smokeless tobacco: Never Used  Substance and Sexual Activity  . Alcohol use: No    Alcohol/week: 0.0 standard drinks  . Drug use: No  . Sexual activity: Not Currently  Other Topics Concern  . Not on file  Social History Narrative   From [place], moved to [place] for her ill daughter, moved to [place], moved back to [place].   Married. Retired.   Lives in [place].   Enjoys babysitting, writing, reading, cooking.      Social Determinants of Health   Financial Resource Strain:   . Difficulty of Paying Living Expenses:   Food Insecurity:   . Worried About Charity fundraiser in the Last Year:   . Arboriculturist in the Last Year:     Transportation Needs:   . Film/video editor (Medical):   Marland Kitchen Lack of Transportation (Non-Medical):   Physical Activity:   . Days of Exercise per Week:   . Minutes of Exercise per Session:   Stress:   . Feeling of Stress :   Social Connections:   . Frequency of Communication with Friends and Family:   . Frequency of Social Gatherings with Friends and Family:   . Attends Religious Services:   . Active Member of Clubs or Organizations:   . Attends Archivist Meetings:   Marland Kitchen Marital Status:   Intimate Partner Violence:   . Fear of Current or Ex-Partner:   . Emotionally Abused:   Marland Kitchen Physically Abused:   . Sexually Abused:     Past Surgical History:  Procedure Laterality Date  . ABDOMINAL HYSTERECTOMY    . APPENDECTOMY    . BREAST EXCISIONAL BIOPSY Right    + 20 years neg  . COLONOSCOPY WITH PROPOFOL N/A 12/16/2017   Procedure: COLONOSCOPY WITH PROPOFOL;  Surgeon: Jonathon Bellows, MD;  Location: [facility] ENDOSCOPY;  Service: Gastroenterology;  Laterality: N/A;    Family History  Problem Relation Age of Onset  . Diabetes Mother        Deceased  . Heart attack Father 32       Deceased  . Cancer Father        Colon  . Cancer Sister        Unusual blood cancer  . Breast cancer Paternal Aunt     Allergies  Allergen Reactions  . Crestor [Rosuvastatin Calcium] Rash and Other (See Comments)    Myalgia  . Tylenol [Acetaminophen] Rash    Current Outpatient Medications on File Prior to Visit  Medication Sig Dispense Refill  . CALCIUM PO Take by mouth. Osteovin    . Cholecalciferol (VITAMIN D3) 1000 units CAPS Take by mouth.    . hydrOXYzine (ATARAX/VISTARIL) 25 MG tablet Take 2 tablets (50 mg total) by mouth at bedtime as needed for anxiety. 180 tablet 0  . Magnesium Gluconate (MAGNESIUM 27) 500 (27 Mg) MG TABS as needed.     . Multiple Vitamins-Minerals (MULTIVITAMIN ADULT PO) Take by mouth. Neuro Vite    . simvastatin (ZOCOR) 40 MG tablet Take 1 tablet (40 mg total) by  mouth daily. For cholesterol. 90 tablet 0  . triamcinolone cream (KENALOG) 0.1 % Apply 1 application topically 2 (two) times daily. (Patient taking differently: Apply 1 application topically as needed. ) 15 g 0   No current facility-administered medications on file prior to visit.    There were no vitals taken for this visit.   Objective:   Physical Exam  Constitutional: She is oriented to person, place, and time. She appears well-nourished. She does not appear ill.  Respiratory: Effort normal.  Neurological: She is alert and oriented to person, place, and time.  Assessment & Plan:

## 2019-04-14 NOTE — Patient Instructions (Signed)
Continue taking the Robitussin Cough and Cold medication that we discussed on the video.  Take 2 pills in the morning, 2 pills in the afternoon, and then use the liquid at bedtime.  Please update me Friday this week as discussed.  It was a pleasure to see you today! Allie Bossier, NP-C

## 2019-05-25 NOTE — Telephone Encounter (Signed)
Lucretia (DPR signed) left v/m that she had already sent my chart note to And[facility] about pt; pt entire lt side is bruised and hurting; pt is in tears due to pain and cannot rest at night. Ibuprofen, ice and heat are not  Helping pain. Jeanella Anton wants to know what else can be done for pain; does pt need to be evaluated in person either at office or UC? No more appts for today at [facility]. Lucretia request cb.

## 2019-05-26 ENCOUNTER — Other Ambulatory Visit: Payer: Self-pay

## 2019-05-26 ENCOUNTER — Encounter: Payer: Self-pay | Admitting: Primary Care

## 2019-05-26 ENCOUNTER — Ambulatory Visit (INDEPENDENT_AMBULATORY_CARE_PROVIDER_SITE_OTHER): Payer: Medicare Other | Admitting: Primary Care

## 2019-05-26 ENCOUNTER — Ambulatory Visit (INDEPENDENT_AMBULATORY_CARE_PROVIDER_SITE_OTHER)
Admission: RE | Admit: 2019-05-26 | Discharge: 2019-05-26 | Disposition: A | Discharge status: Home / Self Care | Payer: Medicare Other | Source: Ambulatory Visit | Attending: Primary Care | Admitting: Primary Care

## 2019-05-26 ENCOUNTER — Other Ambulatory Visit: Payer: Self-pay | Admitting: Primary Care

## 2019-05-26 VITALS — BP 130/82 | HR 67 | Temp 96.2°F | Ht 59.25 in | Wt 216.0 lb

## 2019-05-26 DIAGNOSIS — M79602 Pain in left arm: Secondary | ICD-10-CM

## 2019-05-26 DIAGNOSIS — S42202A Unspecified fracture of upper end of left humerus, initial encounter for closed fracture: Secondary | ICD-10-CM | POA: Diagnosis not present

## 2019-05-26 DIAGNOSIS — S42292A Other displaced fracture of upper end of left humerus, initial encounter for closed fracture: Secondary | ICD-10-CM | POA: Diagnosis not present

## 2019-05-26 DIAGNOSIS — S59912A Unspecified injury of left forearm, initial encounter: Secondary | ICD-10-CM | POA: Diagnosis not present

## 2019-05-26 MED ORDER — TRAMADOL HCL 50 MG PO TABS: 50.0000 mg | ORAL_TABLET | Freq: Three times a day (TID) | ORAL | 0 refills | Status: AC | PRN

## 2019-05-26 NOTE — Patient Instructions (Signed)
Complete xray(s) prior to leaving today. I will notify you of your results once received.  You may take Tramadol every 8 hours as needed for pain.   It was a pleasure to see you today!

## 2019-05-26 NOTE — Assessment & Plan Note (Addendum)
Acute since fall 6 days ago. Moderate bruising on exam with very little ROM. Fracture is high on differentials list.  Check plain films today. Rx for Tramadol course sent to pharmacy.  Await results.

## 2019-05-26 NOTE — Progress Notes (Signed)
Subjective:    Patient ID: Lindsay Snyder, female    DOB: Aug 12, 1945, 74 y.o.   MRN: [id number]  HPI  This visit occurred during the SARS-CoV-2 public health emergency.  Safety protocols were in place, including screening questions prior to the visit, additional usage of staff PPE, and extensive cleaning of exam room while observing appropriate contact time as indicated for disinfecting solutions.   Lindsay Snyder is a 74 year old female with a history of osteopenia, obesity, who presents today with a chief complaint of extremity pain.   Six days ago she was at the grocery store parking lot, her glasses fogged up, and she tripped over one of the parking spot dividers. She fell onto her left side hitting her left shoulder, humerus, and forearm. She was helped up by bystanders, paramedics were called and evaluated. The paramedic determined that she did not fracture her arm based off of ROM exercises.  Since then she's had increased pain, tingling to the left upper extremity that is worse with sitting and standing, changing clothes, showering, toileting. Also with swelling to her fingers on the left side and moderate bruising to her left upper extremity and left lateral breast.   Review of Systems  Musculoskeletal: Positive for arthralgias and joint swelling.  Skin: Positive for color change.  Neurological: Positive for numbness.       Past Medical History:  Diagnosis Date  . Appendicitis 8   . Polyp of colon    Benign     Social History   Socioeconomic History  . Marital status: Married    Spouse name: Not on file  . Number of children: Not on file  . Years of education: Not on file  . Highest education level: Not on file  Occupational History  . Not on file  Tobacco Use  . Smoking status: Never Smoker  . Smokeless tobacco: Never Used  Substance and Sexual Activity  . Alcohol use: No    Alcohol/week: 0.0 standard drinks  . Drug use: No  . Sexual activity: Not Currently  Other  Topics Concern  . Not on file  Social History Narrative   From [place], moved to [place] for her ill daughter, moved to [place], moved back to [place].   Married. Retired.   Lives in [place].   Enjoys babysitting, writing, reading, cooking.      Social Determinants of Health   Financial Resource Strain:   . Difficulty of Paying Living Expenses:   Food Insecurity:   . Worried About Charity fundraiser in the Last Year:   . Arboriculturist in the Last Year:   Transportation Needs:   . Film/video editor (Medical):   Marland Kitchen Lack of Transportation (Non-Medical):   Physical Activity:   . Days of Exercise per Week:   . Minutes of Exercise per Session:   Stress:   . Feeling of Stress :   Social Connections:   . Frequency of Communication with Friends and Family:   . Frequency of Social Gatherings with Friends and Family:   . Attends Religious Services:   . Active Member of Clubs or Organizations:   . Attends Archivist Meetings:   Marland Kitchen Marital Status:   Intimate Partner Violence:   . Fear of Current or Ex-Partner:   . Emotionally Abused:   Marland Kitchen Physically Abused:   . Sexually Abused:     Past Surgical History:  Procedure Laterality Date  . ABDOMINAL HYSTERECTOMY    .  APPENDECTOMY    . BREAST EXCISIONAL BIOPSY Right    + 20 years neg  . COLONOSCOPY WITH PROPOFOL N/A 12/16/2017   Procedure: COLONOSCOPY WITH PROPOFOL;  Surgeon: Jonathon Bellows, MD;  Location: [facility] ENDOSCOPY;  Service: Gastroenterology;  Laterality: N/A;    Family History  Problem Relation Age of Onset  . Diabetes Mother        Deceased  . Heart attack Father 50       Deceased  . Cancer Father        Colon  . Cancer Sister        Unusual blood cancer  . Breast cancer Paternal Aunt     Allergies  Allergen Reactions  . Crestor [Rosuvastatin Calcium] Rash and Other (See Comments)    Myalgia  . Tylenol [Acetaminophen] Rash    Current Outpatient Medications on File Prior to Visit  Medication Sig Dispense  Refill  . CALCIUM PO Take by mouth. Osteovin    . Cholecalciferol (VITAMIN D3) 1000 units CAPS Take by mouth.    . hydrOXYzine (ATARAX/VISTARIL) 25 MG tablet Take 2 tablets (50 mg total) by mouth at bedtime as needed for anxiety. 180 tablet 0  . Magnesium Gluconate (MAGNESIUM 27) 500 (27 Mg) MG TABS as needed.     . Multiple Vitamins-Minerals (MULTIVITAMIN ADULT PO) Take by mouth. Neuro Vite    . simvastatin (ZOCOR) 40 MG tablet Take 1 tablet (40 mg total) by mouth daily. For cholesterol. 90 tablet 0  . triamcinolone cream (KENALOG) 0.1 % Apply 1 application topically 2 (two) times daily. (Patient taking differently: Apply 1 application topically as needed. ) 15 g 0   No current facility-administered medications on file prior to visit.    BP 130/82   Pulse 67   Temp (!) 96.2 F (35.7 C) (Temporal)   Ht 4' 11.25" (1.505 m)   Wt 216 lb (98 kg)   SpO2 98%   BMI 43.26 kg/m    Objective:   Physical Exam  Constitutional: She appears well-nourished.  Musculoskeletal:     Left shoulder: Tenderness, bony tenderness and pain present. Decreased range of motion.     Left upper arm: Swelling, tenderness and bony tenderness present. No deformity.     Left forearm: Tenderness and bony tenderness present. No deformity.     Left hand: Swelling present.     Comments: Very little ROM to left upper extremity due to pain  Skin: Skin is warm and dry.  Moderate bruising to left lateral breast, left humerus            Assessment & Plan:

## 2019-05-27 DIAGNOSIS — S42202A Unspecified fracture of upper end of left humerus, initial encounter for closed fracture: Secondary | ICD-10-CM | POA: Diagnosis not present

## 2019-06-04 DIAGNOSIS — S42202A Unspecified fracture of upper end of left humerus, initial encounter for closed fracture: Secondary | ICD-10-CM | POA: Diagnosis not present

## 2019-06-18 DIAGNOSIS — S42202A Unspecified fracture of upper end of left humerus, initial encounter for closed fracture: Secondary | ICD-10-CM | POA: Diagnosis not present

## 2019-06-23 NOTE — Telephone Encounter (Signed)
Can this please be signed.  ° °Thanks  ° °

## 2019-07-01 DIAGNOSIS — S42202A Unspecified fracture of upper end of left humerus, initial encounter for closed fracture: Secondary | ICD-10-CM | POA: Diagnosis not present

## 2019-07-13 DIAGNOSIS — M25512 Pain in left shoulder: Secondary | ICD-10-CM | POA: Diagnosis not present

## 2019-07-13 DIAGNOSIS — M25612 Stiffness of left shoulder, not elsewhere classified: Secondary | ICD-10-CM | POA: Diagnosis not present

## 2019-07-20 DIAGNOSIS — M25512 Pain in left shoulder: Secondary | ICD-10-CM | POA: Diagnosis not present

## 2019-07-20 DIAGNOSIS — M25612 Stiffness of left shoulder, not elsewhere classified: Secondary | ICD-10-CM | POA: Diagnosis not present

## 2019-08-06 DIAGNOSIS — M25512 Pain in left shoulder: Secondary | ICD-10-CM | POA: Diagnosis not present

## 2019-08-06 DIAGNOSIS — M25612 Stiffness of left shoulder, not elsewhere classified: Secondary | ICD-10-CM | POA: Diagnosis not present

## 2019-08-07 DIAGNOSIS — S42202A Unspecified fracture of upper end of left humerus, initial encounter for closed fracture: Secondary | ICD-10-CM | POA: Diagnosis not present

## 2019-08-10 DIAGNOSIS — M25512 Pain in left shoulder: Secondary | ICD-10-CM | POA: Diagnosis not present

## 2019-08-10 DIAGNOSIS — M25612 Stiffness of left shoulder, not elsewhere classified: Secondary | ICD-10-CM | POA: Diagnosis not present

## 2019-08-13 DIAGNOSIS — M25512 Pain in left shoulder: Secondary | ICD-10-CM | POA: Diagnosis not present

## 2019-08-13 DIAGNOSIS — M25612 Stiffness of left shoulder, not elsewhere classified: Secondary | ICD-10-CM | POA: Diagnosis not present

## 2019-08-25 DIAGNOSIS — M25612 Stiffness of left shoulder, not elsewhere classified: Secondary | ICD-10-CM | POA: Diagnosis not present

## 2019-08-25 DIAGNOSIS — M25512 Pain in left shoulder: Secondary | ICD-10-CM | POA: Diagnosis not present

## 2019-08-28 ENCOUNTER — Other Ambulatory Visit: Payer: Self-pay | Admitting: Primary Care

## 2019-08-28 DIAGNOSIS — Z1231 Encounter for screening mammogram for malignant neoplasm of breast: Secondary | ICD-10-CM

## 2019-09-22 ENCOUNTER — Ambulatory Visit
Admission: RE | Admit: 2019-09-22 | Discharge: 2019-09-22 | Disposition: A | Discharge status: Home / Self Care | Payer: Medicare Other | Source: Ambulatory Visit | Attending: Primary Care | Admitting: Primary Care

## 2019-09-22 ENCOUNTER — Other Ambulatory Visit: Payer: Self-pay

## 2019-09-22 DIAGNOSIS — Z1231 Encounter for screening mammogram for malignant neoplasm of breast: Secondary | ICD-10-CM | POA: Diagnosis not present

## 2019-10-29 DIAGNOSIS — D3132 Benign neoplasm of left choroid: Secondary | ICD-10-CM | POA: Diagnosis not present

## 2019-12-25 ENCOUNTER — Other Ambulatory Visit: Payer: Self-pay

## 2019-12-25 ENCOUNTER — Encounter: Payer: Self-pay | Admitting: Primary Care

## 2019-12-25 ENCOUNTER — Ambulatory Visit (INDEPENDENT_AMBULATORY_CARE_PROVIDER_SITE_OTHER): Payer: Medicare Other | Admitting: Primary Care

## 2019-12-25 VITALS — BP 128/88 | HR 62 | Temp 97.6°F | Ht 59.25 in | Wt 195.0 lb

## 2019-12-25 DIAGNOSIS — R350 Frequency of micturition: Secondary | ICD-10-CM | POA: Diagnosis not present

## 2019-12-25 DIAGNOSIS — Z23 Encounter for immunization: Secondary | ICD-10-CM | POA: Diagnosis not present

## 2019-12-25 LAB — POC URINALSYSI DIPSTICK (AUTOMATED)
Bilirubin, UA: NEGATIVE
Blood, UA: POSITIVE
Glucose, UA: NEGATIVE
Ketones, UA: NEGATIVE
Nitrite, UA: NEGATIVE
Protein, UA: POSITIVE — AB
Spec Grav, UA: 1.025 (ref 1.010–1.025)
Urobilinogen, UA: 0.2 E.U./dL
pH, UA: 6 (ref 5.0–8.0)

## 2019-12-25 MED ORDER — NITROFURANTOIN MONOHYD MACRO 100 MG PO CAPS: 100.0000 mg | ORAL_CAPSULE | Freq: Two times a day (BID) | ORAL | 0 refills | Status: AC

## 2019-12-25 NOTE — Progress Notes (Signed)
Subjective:    Patient ID: Lindsay Snyder, female    DOB: 1945/02/19, 74 y.o.   MRN: [id number]  HPI  This visit occurred during the SARS-CoV-2 public health emergency.  Safety protocols were in place, including screening questions prior to the visit, additional usage of staff PPE, and extensive cleaning of exam room while observing appropriate contact time as indicated for disinfecting solutions.   Lindsay Snyder is a 74 year old female with a history of acute cystitis, osteopenia, hyperlipidemia who presents today with a chief complaint of urinary frequency.  Symptoms began about one month ago. She denies dysuria, hematuria, pelvic pain, abdominal pain, fevers. She is getting up several times during the night.   She endorses drinking enough water. She has been under a lot of personal stress over the last several months for which she thinks may have caused her symptoms.   Review of Systems  Constitutional: Negative for fever.  Gastrointestinal: Negative for abdominal pain.  Genitourinary: Positive for frequency. Negative for dysuria, pelvic pain, urgency and vaginal discharge.       Past Medical History:  Diagnosis Date  . Appendicitis 8   . Polyp of colon    Benign     Social History   Socioeconomic History  . Marital status: Married    Spouse name: Not on file  . Number of children: Not on file  . Years of education: Not on file  . Highest education level: Not on file  Occupational History  . Not on file  Tobacco Use  . Smoking status: Never Smoker  . Smokeless tobacco: Never Used  Vaping Use  . Vaping Use: Never used  Substance and Sexual Activity  . Alcohol use: No    Alcohol/week: 0.0 standard drinks  . Drug use: No  . Sexual activity: Not Currently  Other Topics Concern  . Not on file  Social History Narrative   From [place], moved to [place] for her ill daughter, moved to [place], moved back to [place].   Married. Retired.   Lives in [place].   Enjoys babysitting,  writing, reading, cooking.      Social Determinants of Health   Financial Resource Strain:   . Difficulty of Paying Living Expenses: Not on file  Food Insecurity:   . Worried About Charity fundraiser in the Last Year: Not on file  . Ran Out of Food in the Last Year: Not on file  Transportation Needs:   . Lack of Transportation (Medical): Not on file  . Lack of Transportation (Non-Medical): Not on file  Physical Activity:   . Days of Exercise per Week: Not on file  . Minutes of Exercise per Session: Not on file  Stress:   . Feeling of Stress : Not on file  Social Connections:   . Frequency of Communication with Friends and Family: Not on file  . Frequency of Social Gatherings with Friends and Family: Not on file  . Attends Religious Services: Not on file  . Active Member of Clubs or Organizations: Not on file  . Attends Archivist Meetings: Not on file  . Marital Status: Not on file  Intimate Partner Violence:   . Fear of Current or Ex-Partner: Not on file  . Emotionally Abused: Not on file  . Physically Abused: Not on file  . Sexually Abused: Not on file    Past Surgical History:  Procedure Laterality Date  . ABDOMINAL HYSTERECTOMY    . APPENDECTOMY    .  BREAST EXCISIONAL BIOPSY Right    + 20 years neg  . COLONOSCOPY WITH PROPOFOL N/A 12/16/2017   Procedure: COLONOSCOPY WITH PROPOFOL;  Surgeon: Jonathon Bellows, MD;  Location: [facility] ENDOSCOPY;  Service: Gastroenterology;  Laterality: N/A;    Family History  Problem Relation Age of Onset  . Diabetes Mother        Deceased  . Heart attack Father 63       Deceased  . Cancer Father        Colon  . Cancer Sister        Unusual blood cancer  . Breast cancer Paternal Aunt     Allergies  Allergen Reactions  . Crestor [Rosuvastatin Calcium] Rash and Other (See Comments)    Myalgia  . Tylenol [Acetaminophen] Rash    Current Outpatient Medications on File Prior to Visit  Medication Sig Dispense Refill  .  CALCIUM PO Take by mouth. Osteovin    . Cholecalciferol (VITAMIN D3) 1000 units CAPS Take by mouth.    . Magnesium Gluconate (MAGNESIUM 27) 500 (27 Mg) MG TABS as needed.     . Multiple Vitamins-Minerals (MULTIVITAMIN ADULT PO) Take by mouth. Neuro Vite     No current facility-administered medications on file prior to visit.    BP 128/88   Pulse 62   Temp 97.6 F (36.4 C) (Temporal)   Ht 4' 11.25" (1.505 m)   Wt 195 lb (88.5 kg)   SpO2 99%   BMI 39.05 kg/m    Objective:   Physical Exam Cardiovascular:     Rate and Rhythm: Normal rate and regular rhythm.  Pulmonary:     Effort: Pulmonary effort is normal.     Breath sounds: Normal breath sounds.  Musculoskeletal:     Cervical back: Neck supple.  Skin:    General: Skin is warm and dry.            Assessment & Plan:

## 2019-12-25 NOTE — Assessment & Plan Note (Signed)
Acute for the last month, no other symptoms.   UA today with 3+ leuks, trace blood. No nitrites.  Culture sent.  Given duration of symptoms coupled with UA results, will treat. Rx for Macrobid antibiotics sent to pharmacy.

## 2019-12-25 NOTE — Patient Instructions (Addendum)
Start Macrobid (nitrofurantoin) tablets for urinary tract infection. Take 1 tablet by mouth twice daily for five days.  Be sure to drink plenty of water.  It was a pleasure to see you today!    Influenza (Flu) Vaccine (Inactivated or Recombinant): What You Need to Know 1. Why get vaccinated? Influenza vaccine can prevent influenza (flu). Flu is a contagious disease that spreads around the [place] every year, usually between October and May. Anyone can get the flu, but it is more dangerous for some people. Infants and young children, people 59 years of age and older, pregnant women, and people with certain health conditions or a weakened immune system are at greatest risk of flu complications. Pneumonia, bronchitis, sinus infections and ear infections are examples of flu-related complications. If you have a medical condition, such as heart disease, cancer or diabetes, flu can make it worse. Flu can cause fever and chills, sore throat, muscle aches, fatigue, cough, headache, and runny or stuffy nose. Some people may have vomiting and diarrhea, though this is more common in children than adults. Each year thousands of people in the [place] die from flu, and many more are hospitalized. Flu vaccine prevents millions of illnesses and flu-related visits to the doctor each year. 2. Influenza vaccine CDC recommends everyone 8 months of age and older get vaccinated every flu season. Children 6 months through 37 years of age may need 2 doses during a single flu season. Everyone else needs only 1 dose each flu season. It takes about 2 weeks for protection to develop after vaccination. There are many flu viruses, and they are always changing. Each year a new flu vaccine is made to protect against three or four viruses that are likely to cause disease in the upcoming flu season. Even when the vaccine doesn't exactly match these viruses, it may still provide some protection. Influenza vaccine does not  cause flu. Influenza vaccine may be given at the same time as other vaccines. 3. Talk with your health care provider Tell your vaccine provider if the person getting the vaccine:  Has had an allergic reaction after a previous dose of influenza vaccine, or has any severe, life-threatening allergies.  Has ever had Guillain-Barr Syndrome (also called GBS). In some cases, your health care provider may decide to postpone influenza vaccination to a future visit. People with minor illnesses, such as a cold, may be vaccinated. People who are moderately or severely ill should usually wait until they recover before getting influenza vaccine. Your health care provider can give you more information. 4. Risks of a vaccine reaction  Soreness, redness, and swelling where shot is given, fever, muscle aches, and headache can happen after influenza vaccine.  There may be a very small increased risk of Guillain-Barr Syndrome (GBS) after inactivated influenza vaccine (the flu shot). Young children who get the flu shot along with pneumococcal vaccine (PCV13), and/or DTaP vaccine at the same time might be slightly more likely to have a seizure caused by fever. Tell your health care provider if a child who is getting flu vaccine has ever had a seizure. People sometimes faint after medical procedures, including vaccination. Tell your provider if you feel dizzy or have vision changes or ringing in the ears. As with any medicine, there is a very remote chance of a vaccine causing a severe allergic reaction, other serious injury, or death. 5. What if there is a serious problem? An allergic reaction could occur after the vaccinated person leaves the clinic.  If you see signs of a severe allergic reaction (hives, swelling of the face and throat, difficulty breathing, a fast heartbeat, dizziness, or weakness), call 9-1-1 and get the person to the nearest hospital. For other signs that concern you, call your health care  provider. Adverse reactions should be reported to the Vaccine Adverse Event Reporting System (VAERS). Your health care provider will usually file this report, or you can do it yourself. Visit the VAERS website at www.vaers.SamedayNews.es or call 626-484-7387.VAERS is only for reporting reactions, and VAERS staff do not give medical advice. 6. The National Vaccine Injury Compensation Program The [employer] Vaccine Injury Compensation Program (VICP) is a federal program that was created to compensate people who may have been injured by certain vaccines. Visit the VICP website at GoldCloset.com.ee or call (936)055-9018 to learn about the program and about filing a claim. There is a time limit to file a claim for compensation. 7. How can I learn more?  Ask your healthcare provider.  Call your local or state health department.  Contact the Centers for Disease Control and Prevention (CDC): ? Call 819-884-8603 (1-800-CDC-INFO) or ? Visit CDC's https://gibson.com/ Vaccine Information Statement (Interim) Inactivated Influenza Vaccine (09/12/2017) This information is not intended to replace advice given to you by your health care provider. Make sure you discuss any questions you have with your health care provider. Document Revised: 05/06/2018 Document Reviewed: 09/16/2017 Elsevier Patient Education  [place].

## 2019-12-28 DIAGNOSIS — R350 Frequency of micturition: Secondary | ICD-10-CM | POA: Diagnosis not present

## 2019-12-30 LAB — URINE CULTURE
MICRO NUMBER:: 11252005
SPECIMEN QUALITY:: ADEQUATE

## 2020-01-03 DIAGNOSIS — N3001 Acute cystitis with hematuria: Secondary | ICD-10-CM

## 2020-01-04 MED ORDER — SULFAMETHOXAZOLE-TRIMETHOPRIM 800-160 MG PO TABS: 1.0000 | ORAL_TABLET | Freq: Two times a day (BID) | ORAL | 0 refills | Status: DC

## 2020-01-12 ENCOUNTER — Telehealth: Payer: Medicare Other | Admitting: Primary Care

## 2020-01-14 ENCOUNTER — Other Ambulatory Visit: Payer: Self-pay

## 2020-01-14 ENCOUNTER — Encounter: Payer: Self-pay | Admitting: Primary Care

## 2020-01-14 ENCOUNTER — Other Ambulatory Visit: Payer: Self-pay | Admitting: Primary Care

## 2020-01-14 ENCOUNTER — Ambulatory Visit (INDEPENDENT_AMBULATORY_CARE_PROVIDER_SITE_OTHER): Payer: Medicare Other | Admitting: Primary Care

## 2020-01-14 VITALS — BP 130/84 | HR 73 | Temp 97.1°F | Ht 59.25 in | Wt 196.0 lb

## 2020-01-14 DIAGNOSIS — F5102 Adjustment insomnia: Secondary | ICD-10-CM | POA: Diagnosis not present

## 2020-01-14 DIAGNOSIS — J3089 Other allergic rhinitis: Secondary | ICD-10-CM | POA: Diagnosis not present

## 2020-01-14 DIAGNOSIS — R0982 Postnasal drip: Secondary | ICD-10-CM

## 2020-01-14 DIAGNOSIS — F411 Generalized anxiety disorder: Secondary | ICD-10-CM

## 2020-01-14 MED ORDER — LEVOCETIRIZINE DIHYDROCHLORIDE 5 MG PO TABS: 5.0000 mg | ORAL_TABLET | Freq: Every evening | ORAL | 0 refills | Status: DC

## 2020-01-14 MED ORDER — SERTRALINE HCL 50 MG PO TABS: 50.0000 mg | ORAL_TABLET | Freq: Every day | ORAL | 1 refills | Status: DC

## 2020-01-14 MED ORDER — FLUTICASONE PROPIONATE 50 MCG/ACT NA SUSP: 1.0000 | Freq: Two times a day (BID) | NASAL | 0 refills | Status: DC

## 2020-01-14 NOTE — Patient Instructions (Signed)
You will be contacted regarding your referral to therapy.  Please let us know if you have not been contacted within two weeks.   Start sertraline (Zoloft) 50 mg for anxiety. Start with 1/2 tablet daily for 6-8 days, then increase to 1 full tablet thereafter.  You may take the levocetirizine at bedtime for allergies/drainge.  Nasal Congestion/Ear Pressure/Sinus Pressure: Try using Flonase (fluticasone) nasal spray. Instill 1 spray in each nostril twice daily.   Please schedule a follow up visit for 6 weeks for follow up of anxiety/depression.  It was a pleasure to see you today!

## 2020-01-14 NOTE — Progress Notes (Signed)
Subjective:    Patient ID: Lindsay KEEVEN, female    DOB: 09/20/1945, 74 y.o.   MRN: [id number]  HPI  This visit occurred during the SARS-CoV-2 public health emergency.  Safety protocols were in place, including screening questions prior to the visit, additional usage of staff PPE, and extensive cleaning of exam room while observing appropriate contact time as indicated for disinfecting solutions.   Lindsay Snyder is a 74 year old female with a history of anxiety and insomnia, post nasal drip, seasonal allergies who presents today to discuss a few issues.  Her daughter is with her today helping to provide information for HPI  1) Ear Fullness: History of post nasal drip with cough, occurs intermittently throughout the year, worse during seasonal changes. Bilateral ears feel "wet", also feel full. She's attempted to irrigate her ears with water didn't see any wax removed. She denies pain, cough, fevers.   She does not use a nasal spray or taken an antihistamine.   2) Insomnia: Chronic for years, difficulty falling asleep and will wake during the night. If she cannot sleep she will get up and watch TV in the other room. If her day has been less stressful and she's feeling happier, then she sleeps well.   She's been under a lot of stress at home due to marital problems. Long history of marital problems which have since escalated resulting in her husband moving out of her home. She is relieved that her husband has moved, but is afraid of his return. He has belongings stored in her garage and shed, and will have to come back at some point to retrieve these items.    She worries constantly about family, her health, her husband returning during the middle of the night. She will wake at night if she hears a noise, thinking it was her husband returning home, unable to sleep due to worry.  She has never been treated for anxiety or seen therapy.  She was once placed on Trazodone, hydroxyzine but did not  experience improvement in sleep.   Review of Systems  Constitutional: Negative for fever.  HENT: Positive for postnasal drip.        Ear fullness  Allergic/Immunologic: Positive for environmental allergies.  Psychiatric/Behavioral: Positive for sleep disturbance. The patient is nervous/anxious.        See HPI       Past Medical History:  Diagnosis Date  . Appendicitis 8   . Polyp of colon    Benign     Social History   Socioeconomic History  . Marital status: Married    Spouse name: Not on file  . Number of children: Not on file  . Years of education: Not on file  . Highest education level: Not on file  Occupational History  . Not on file  Tobacco Use  . Smoking status: Never Smoker  . Smokeless tobacco: Never Used  Vaping Use  . Vaping Use: Never used  Substance and Sexual Activity  . Alcohol use: No    Alcohol/week: 0.0 standard drinks  . Drug use: No  . Sexual activity: Not Currently  Other Topics Concern  . Not on file  Social History Narrative   From [place], moved to [place] for her ill daughter, moved to [place], moved back to [place].   Married. Retired.   Lives in [place].   Enjoys babysitting, writing, reading, cooking.      Social Determinants of Health   Financial Resource Strain: Not on  file  Food Insecurity: Not on file  Transportation Needs: Not on file  Physical Activity: Not on file  Stress: Not on file  Social Connections: Not on file  Intimate Partner Violence: Not on file    Past Surgical History:  Procedure Laterality Date  . ABDOMINAL HYSTERECTOMY    . APPENDECTOMY    . BREAST EXCISIONAL BIOPSY Right    + 20 years neg  . COLONOSCOPY WITH PROPOFOL N/A 12/16/2017   Procedure: COLONOSCOPY WITH PROPOFOL;  Surgeon: Jonathon Bellows, MD;  Location: [facility];  Service: Gastroenterology;  Laterality: N/A;    Family History  Problem Relation Age of Onset  . Diabetes Mother        Deceased  . Heart attack Father 21       Deceased  .  Cancer Father        Colon  . Cancer Sister        Unusual blood cancer  . Breast cancer Paternal Aunt     Allergies  Allergen Reactions  . Crestor [Rosuvastatin Calcium] Rash and Other (See Comments)    Myalgia  . Tylenol [Acetaminophen] Rash    Current Outpatient Medications on File Prior to Visit  Medication Sig Dispense Refill  . CALCIUM PO Take by mouth. Osteovin    . Cholecalciferol (VITAMIN D3) 1000 units CAPS Take by mouth.    . Magnesium Gluconate (MAGNESIUM 27) 500 (27 Mg) MG TABS as needed.     . Multiple Vitamins-Minerals (MULTIVITAMIN ADULT PO) Take by mouth. Neuro Vite     No current facility-administered medications on file prior to visit.    BP 130/84   Pulse 73   Temp (!) 97.1 F (36.2 C) (Temporal)   Ht 4' 11.25" (1.505 m)   Wt 196 lb (88.9 kg)   SpO2 98%   BMI 39.25 kg/m    Objective:   Physical Exam Constitutional:      Appearance: She is well-nourished.  HENT:     Right Ear: Tympanic membrane and ear canal normal.     Left Ear: Tympanic membrane and ear canal normal.  Cardiovascular:     Rate and Rhythm: Normal rate and regular rhythm.  Pulmonary:     Effort: Pulmonary effort is normal.     Breath sounds: Normal breath sounds.  Musculoskeletal:     Cervical back: Neck supple.  Skin:    General: Skin is warm and dry.  Neurological:     Mental Status: She is oriented to person, place, and time.  Psychiatric:        Mood and Affect: Mood and affect and mood normal.            Assessment & Plan:

## 2020-01-14 NOTE — Assessment & Plan Note (Signed)
Chronic for years, never treated.  Uncontrolled now and is ready for treatment.  She agrees to both therapy and medication treatment.  Referral placed for therapy.  Prescription for Zoloft 50 mg sent to pharmacy.  Patient is to take 1/2 tablet daily for 8 days, then advance to 1 full tablet thereafter. We discussed possible side effects of headache, GI upset, drowsiness, and SI/HI. If thoughts of SI/HI develop, we discussed to present to the emergency immediately. Patient verbalized understanding.   Follow up in 6 weeks for re-evaluation.

## 2020-01-14 NOTE — Assessment & Plan Note (Signed)
Suspect ear fullness and postnasal drip are secondary to allergy involvement.  Discussed use of Xyzal at night which will cause drowsiness helping her to sleep  Discussed use of Flonase for ear fullness.  Both prescription sent to pharmacy.  She will update.

## 2020-01-14 NOTE — Assessment & Plan Note (Addendum)
Secondary to anxiety and stress.  No improvement with trazodone or hydroxyzine.  She will start with Xyzal at night given allergy symptoms, we will also work on treating anxiety which is the underlying cause for her sleep disturbance.

## 2020-02-09 ENCOUNTER — Other Ambulatory Visit: Payer: Self-pay | Admitting: Primary Care

## 2020-02-09 DIAGNOSIS — J3089 Other allergic rhinitis: Secondary | ICD-10-CM

## 2020-02-09 DIAGNOSIS — R0982 Postnasal drip: Secondary | ICD-10-CM

## 2020-02-22 ENCOUNTER — Ambulatory Visit (INDEPENDENT_AMBULATORY_CARE_PROVIDER_SITE_OTHER): Payer: Medicare Other | Admitting: Psychology

## 2020-02-22 DIAGNOSIS — F411 Generalized anxiety disorder: Secondary | ICD-10-CM | POA: Diagnosis not present

## 2020-03-01 ENCOUNTER — Encounter: Payer: Self-pay | Admitting: Primary Care

## 2020-03-01 ENCOUNTER — Ambulatory Visit (INDEPENDENT_AMBULATORY_CARE_PROVIDER_SITE_OTHER)
Admission: RE | Admit: 2020-03-01 | Discharge: 2020-03-01 | Disposition: A | Discharge status: Home / Self Care | Payer: Medicare Other | Source: Ambulatory Visit | Attending: Primary Care | Admitting: Primary Care

## 2020-03-01 ENCOUNTER — Ambulatory Visit (INDEPENDENT_AMBULATORY_CARE_PROVIDER_SITE_OTHER): Payer: Medicare Other | Admitting: Primary Care

## 2020-03-01 ENCOUNTER — Other Ambulatory Visit: Payer: Self-pay

## 2020-03-01 VITALS — BP 122/80 | HR 66 | Temp 97.8°F | Ht 59.5 in | Wt 201.1 lb

## 2020-03-01 DIAGNOSIS — J3089 Other allergic rhinitis: Secondary | ICD-10-CM

## 2020-03-01 DIAGNOSIS — R109 Unspecified abdominal pain: Secondary | ICD-10-CM | POA: Diagnosis not present

## 2020-03-01 DIAGNOSIS — R1084 Generalized abdominal pain: Secondary | ICD-10-CM

## 2020-03-01 DIAGNOSIS — F5102 Adjustment insomnia: Secondary | ICD-10-CM | POA: Diagnosis not present

## 2020-03-01 DIAGNOSIS — S0031XA Abrasion of nose, initial encounter: Secondary | ICD-10-CM | POA: Diagnosis not present

## 2020-03-01 DIAGNOSIS — F411 Generalized anxiety disorder: Secondary | ICD-10-CM | POA: Diagnosis not present

## 2020-03-01 DIAGNOSIS — D582 Other hemoglobinopathies: Secondary | ICD-10-CM | POA: Diagnosis not present

## 2020-03-01 LAB — CBC WITH DIFFERENTIAL/PLATELET
Basophils Absolute: 0 10*3/uL (ref 0.0–0.1)
Basophils Relative: 0.7 % (ref 0.0–3.0)
Eosinophils Absolute: 0.2 10*3/uL (ref 0.0–0.7)
Eosinophils Relative: 2.6 % (ref 0.0–5.0)
HCT: 45.1 % (ref 36.0–46.0)
Hemoglobin: 15.2 g/dL — ABNORMAL HIGH (ref 12.0–15.0)
Lymphocytes Relative: 23.2 % (ref 12.0–46.0)
Lymphs Abs: 1.6 10*3/uL (ref 0.7–4.0)
MCHC: 33.7 g/dL (ref 30.0–36.0)
MCV: 89.4 fl (ref 78.0–100.0)
Monocytes Absolute: 0.6 10*3/uL (ref 0.1–1.0)
Monocytes Relative: 8.6 % (ref 3.0–12.0)
Neutro Abs: 4.4 10*3/uL (ref 1.4–7.7)
Neutrophils Relative %: 64.9 % (ref 43.0–77.0)
Platelets: 209 10*3/uL (ref 150.0–400.0)
RBC: 5.05 Mil/uL (ref 3.87–5.11)
RDW: 13.3 % (ref 11.5–15.5)
WBC: 6.8 10*3/uL (ref 4.0–10.5)

## 2020-03-01 LAB — COMPREHENSIVE METABOLIC PANEL
ALT: 13 U/L (ref 0–35)
AST: 16 U/L (ref 0–37)
Albumin: 3.9 g/dL (ref 3.5–5.2)
Alkaline Phosphatase: 58 U/L (ref 39–117)
BUN: 27 mg/dL — ABNORMAL HIGH (ref 6–23)
CO2: 29 mEq/L (ref 19–32)
Calcium: 9.3 mg/dL (ref 8.4–10.5)
Chloride: 103 mEq/L (ref 96–112)
Creatinine, Ser: 0.87 mg/dL (ref 0.40–1.20)
GFR: 65.11 mL/min (ref >60.00)
Glucose, Bld: 80 mg/dL (ref 70–99)
Potassium: 4.3 mEq/L (ref 3.5–5.1)
Sodium: 137 mEq/L (ref 135–145)
Total Bilirubin: 0.8 mg/dL (ref 0.2–1.2)
Total Protein: 6.7 g/dL (ref 6.0–8.3)

## 2020-03-01 LAB — LIPASE: Lipase: 31 U/L (ref 11.0–59.0)

## 2020-03-01 MED ORDER — SERTRALINE HCL 100 MG PO TABS: 100.0000 mg | ORAL_TABLET | Freq: Every day | ORAL | 1 refills | Status: DC

## 2020-03-01 MED ORDER — MUPIROCIN CALCIUM 2 % NA OINT: 1.0000 "application " | TOPICAL_OINTMENT | Freq: Two times a day (BID) | NASAL | 0 refills | Status: DC

## 2020-03-01 NOTE — Patient Instructions (Signed)
We increased your sertraline (Zoloft) to 100 mg, but gradually increase as discussed. Take 75 mg for 2 weeks, then increase to 100 mg thereafter.   Stop by the lab and xray prior to leaving today. I will notify you of your results once received.   Apply the mupirocin ointment nasally twice daily for about 5-7 days.  It was a pleasure to see you today!

## 2020-03-01 NOTE — Assessment & Plan Note (Signed)
Slight improvement per her daughter and herself, I would agree.  I also agree that she would benefit from a dose increase, will increase Zoloft to 75 mg for 2 weeks, then increase to 100 mg thereafter.  She and her daughter will update.  Continue with therapy.

## 2020-03-01 NOTE — Progress Notes (Signed)
Subjective:    Patient ID: Lindsay Snyder, female    DOB: 06-Sep-1945, 75 y.o.   MRN: [id number]  HPI  This visit occurred during the SARS-CoV-2 public health emergency.  Safety protocols were in place, including screening questions prior to the visit, additional usage of staff PPE, and extensive cleaning of exam room while observing appropriate contact time as indicated for disinfecting solutions.   Lindsay Snyder is a 75 year old female with a history of cystitis, insomnia, anxiety, hyperlipidemia who presents today for follow up and to discuss a few issues.  1) GAD/Insomnia: Currently managed on sertraline 50 mg for which was initiated about 2 months ago for chronic symptoms of constant anxiety and worry that had become cumbersome.  Today she is compliant to her sertraline 50 mg, and since her last visit her daughter has noticed some improvement in her anxiety, the patient agrees but continues to experience nightmares, difficulty sleeping, and is thinking "worse case scenario". She's had one session of therapy, more so a meet and greet. Her therapist has her keeping a sleeping log as she seems to wake during the night quite a bit.  Her daughter believes there is room for a dose increase of Zoloft and believes that she would improve further  2) Allergies: Currently managed on Xyzal for which has helped with ear fullness, but has caused dryness and bleeding to her nose. She has not tried saline nasal spray.  She has developed a scab inside the left nares.   3) Abdominal Pain: Located to the general abdomen for the last 2-3 months. Intermittent pain with diarrhea, constipation, gas, bloating. No prior history of constipation but has noticed alterations in the size of her stools. She's tried taking Pepto Bismol with improvement. She denies nausea, vomiting, bloody stools.   Review of Systems  HENT:       Nasal scabbing, nasal bleeding  Gastrointestinal: Positive for abdominal pain, constipation and  diarrhea. Negative for blood in stool.  Allergic/Immunologic: Positive for environmental allergies.  Psychiatric/Behavioral: Positive for sleep disturbance. The patient is nervous/anxious.        Past Medical History:  Diagnosis Date  . Appendicitis 8   . Polyp of colon    Benign     Social History   Socioeconomic History  . Marital status: Married    Spouse name: Not on file  . Number of children: Not on file  . Years of education: Not on file  . Highest education level: Not on file  Occupational History  . Not on file  Tobacco Use  . Smoking status: Never Smoker  . Smokeless tobacco: Never Used  Vaping Use  . Vaping Use: Never used  Substance and Sexual Activity  . Alcohol use: No    Alcohol/week: 0.0 standard drinks  . Drug use: No  . Sexual activity: Not Currently  Other Topics Concern  . Not on file  Social History Narrative   From [place], moved to [place] for her ill daughter, moved to [place], moved back to [place].   Married. Retired.   Lives in [place].   Enjoys babysitting, writing, reading, cooking.      Social Determinants of Health   Financial Resource Strain: Not on file  Food Insecurity: Not on file  Transportation Needs: Not on file  Physical Activity: Not on file  Stress: Not on file  Social Connections: Not on file  Intimate Partner Violence: Not on file    Past Surgical History:  Procedure Laterality  Date  . ABDOMINAL HYSTERECTOMY    . APPENDECTOMY    . BREAST EXCISIONAL BIOPSY Right    + 20 years neg  . COLONOSCOPY WITH PROPOFOL N/A 12/16/2017   Procedure: COLONOSCOPY WITH PROPOFOL;  Surgeon: Jonathon Bellows, MD;  Location: [facility]COPY;  Service: Gastroenterology;  Laterality: N/A;    Family History  Problem Relation Age of Onset  . Diabetes Mother        Deceased  . Heart attack Father 53       Deceased  . Cancer Father        Colon  . Cancer Sister        Unusual blood cancer  . Breast cancer Paternal Aunt     Allergies   Allergen Reactions  . Crestor [Rosuvastatin Calcium] Rash and Other (See Comments)    Myalgia  . Tylenol [Acetaminophen] Rash    Current Outpatient Medications on File Prior to Visit  Medication Sig Dispense Refill  . CALCIUM PO Take by mouth. Osteovin    . Cholecalciferol (VITAMIN D3) 1000 units CAPS Take by mouth.    . fluticasone (FLONASE) 50 MCG/ACT nasal spray Place 1 spray into both nostrils 2 (two) times daily. 16 g 0  . levocetirizine (XYZAL) 5 MG tablet TAKE 1 TABLET(5 MG) BY MOUTH EVERY EVENING FOR ALLERGIES 30 tablet 0  . Magnesium Gluconate (MAGNESIUM 27) 500 (27 Mg) MG TABS as needed.     . Multiple Vitamins-Minerals (MULTIVITAMIN ADULT PO) Take by mouth. Neuro Vite     No current facility-administered medications on file prior to visit.    BP 122/80   Pulse 66   Temp 97.8 F (36.6 C) (Temporal)   Ht 4' 11.5" (1.511 m)   Wt 201 lb 1.9 oz (91.2 kg)   SpO2 98%   BMI 39.94 kg/m    Objective:   Physical Exam Constitutional:      Appearance: She is well-nourished.  HENT:     Nose:     Comments: Scabbing noted inside of left nostril to lower portion of turbinate, no active bleeding Cardiovascular:     Rate and Rhythm: Normal rate and regular rhythm.  Pulmonary:     Effort: Pulmonary effort is normal.     Breath sounds: Normal breath sounds.  Abdominal:     General: Bowel sounds are normal.     Palpations: Abdomen is soft.     Tenderness: There is abdominal tenderness in the right upper quadrant and periumbilical area. There is no guarding.  Musculoskeletal:     Cervical back: Neck supple.  Skin:    General: Skin is warm and dry.  Psychiatric:        Mood and Affect: Mood and affect and mood normal.            Assessment & Plan:

## 2020-03-01 NOTE — Assessment & Plan Note (Signed)
And side left nose to lower turbinate.  Does not appear suspicious. Prescription for mupirocin ointment sent to pharmacy for treatment.Marland Kitchen

## 2020-03-01 NOTE — Assessment & Plan Note (Signed)
Ear fullness has improved with Xyzal, but unfortunately seems to be causing dryness to her nasal cavity.  Discussed use of saline nasal spray to help with moisture.  Will also treat scabbed abrasion to nostril

## 2020-03-01 NOTE — Assessment & Plan Note (Signed)
Not mentioned during last visit, but apparently has been experiencing symptoms for 3 months.  Unclear etiology at this point, but differentials include constipation, IBS.  She does have a family history of diverticulitis in her daughter, this does not appear evident on exam today.  Checking abdominal plain films and labs today. Await results.

## 2020-03-01 NOTE — Addendum Note (Signed)
Addended by: Cloyd Stagers on: 03/01/2020 05:02 PM   Modules accepted: Orders

## 2020-03-01 NOTE — Assessment & Plan Note (Signed)
Slightly improved with Zoloft, but agree that she may benefit from a dose increase.  Increasing Zoloft to 75 mg x 2 weeks, then increase to 100 mg thereafter.  Continue with therapy sessions.

## 2020-03-03 LAB — PATHOLOGIST SMEAR REVIEW

## 2020-03-04 ENCOUNTER — Ambulatory Visit: Payer: Medicare Other | Admitting: Psychology

## 2020-03-07 ENCOUNTER — Ambulatory Visit: Payer: Medicare Other | Admitting: Psychology

## 2020-03-28 ENCOUNTER — Ambulatory Visit (INDEPENDENT_AMBULATORY_CARE_PROVIDER_SITE_OTHER): Payer: Medicare Other | Admitting: Psychology

## 2020-03-28 DIAGNOSIS — F411 Generalized anxiety disorder: Secondary | ICD-10-CM | POA: Diagnosis not present

## 2020-04-04 DIAGNOSIS — F5102 Adjustment insomnia: Secondary | ICD-10-CM

## 2020-04-04 DIAGNOSIS — F411 Generalized anxiety disorder: Secondary | ICD-10-CM

## 2020-04-04 DIAGNOSIS — R1084 Generalized abdominal pain: Secondary | ICD-10-CM

## 2020-04-08 MED ORDER — ESCITALOPRAM OXALATE 10 MG PO TABS: 10.0000 mg | ORAL_TABLET | Freq: Every day | ORAL | 0 refills | Status: DC

## 2020-04-18 ENCOUNTER — Ambulatory Visit
Admission: RE | Admit: 2020-04-18 | Discharge: 2020-04-18 | Disposition: A | Discharge status: Home / Self Care | Payer: Medicare Other | Source: Ambulatory Visit | Attending: Primary Care | Admitting: Primary Care

## 2020-04-18 ENCOUNTER — Other Ambulatory Visit: Payer: Self-pay | Admitting: Family Medicine

## 2020-04-18 ENCOUNTER — Other Ambulatory Visit: Payer: Self-pay

## 2020-04-18 DIAGNOSIS — R1084 Generalized abdominal pain: Secondary | ICD-10-CM

## 2020-04-18 DIAGNOSIS — R109 Unspecified abdominal pain: Secondary | ICD-10-CM | POA: Diagnosis not present

## 2020-04-18 LAB — POCT I-STAT CREATININE: Creatinine, Ser: 0.9 mg/dL (ref 0.44–1.00)

## 2020-04-18 MED ORDER — IOHEXOL 300 MG/ML  SOLN
125.0000 mL | Freq: Once | INTRAMUSCULAR | Status: AC | PRN
  Administered 2020-04-18: 125 mL via INTRAVENOUS

## 2020-04-18 NOTE — Telephone Encounter (Signed)
See Cr results in EMR.

## 2020-04-18 NOTE — Telephone Encounter (Signed)
No Auth required for CT Abd  Pt worked in today at [employer] order for STAT CMET - most recent 03/01/20 is too old  Dr Damita Dunnings is the on-call covering provider since Anda Kraft is off today.  He has been made aware.

## 2020-04-22 ENCOUNTER — Ambulatory Visit (INDEPENDENT_AMBULATORY_CARE_PROVIDER_SITE_OTHER): Payer: Medicare Other | Admitting: Psychology

## 2020-04-22 DIAGNOSIS — F411 Generalized anxiety disorder: Secondary | ICD-10-CM | POA: Diagnosis not present

## 2020-04-25 ENCOUNTER — Ambulatory Visit: Payer: Medicare Other | Admitting: Psychology

## 2020-05-09 ENCOUNTER — Ambulatory Visit: Payer: Medicare Other | Admitting: Psychology

## 2020-05-18 ENCOUNTER — Other Ambulatory Visit: Payer: Self-pay

## 2020-05-18 ENCOUNTER — Telehealth (INDEPENDENT_AMBULATORY_CARE_PROVIDER_SITE_OTHER): Payer: Medicare Other | Admitting: Primary Care

## 2020-05-18 DIAGNOSIS — R1084 Generalized abdominal pain: Secondary | ICD-10-CM | POA: Diagnosis not present

## 2020-05-18 DIAGNOSIS — F411 Generalized anxiety disorder: Secondary | ICD-10-CM | POA: Diagnosis not present

## 2020-05-18 DIAGNOSIS — F5102 Adjustment insomnia: Secondary | ICD-10-CM | POA: Diagnosis not present

## 2020-05-18 MED ORDER — ESCITALOPRAM OXALATE 10 MG PO TABS: 10.0000 mg | ORAL_TABLET | Freq: Every day | ORAL | 1 refills | Status: DC

## 2020-05-18 NOTE — Assessment & Plan Note (Signed)
Improving with Lexapro 10 mg, continue same.

## 2020-05-18 NOTE — Progress Notes (Signed)
Patient ID: Lindsay Snyder, female    DOB: Feb 02, 1945, 75 y.o.   MRN: [id number]  Virtual visit completed through [employer], a video enabled telemedicine application. Due to national recommendations of social distancing due to COVID-19, a virtual visit is felt to be most appropriate for this patient at this time. Reviewed limitations, risks, security and privacy concerns of performing a virtual visit and the availability of in person appointments. I also reviewed that there may be a patient responsible charge related to this service. The patient agreed to proceed.   Patient location: home Provider location: [place] at [facility], office Persons participating in this virtual visit: patient, provider, patient's daughter  If any vitals were documented, they were collected by patient at home unless specified below.    Ht 4\' 11"  (1.499 m)   Wt 201 lb (91.2 kg)   BMI 40.60 kg/m    CC: Follow up of Anxiety Subjective:   HPI: Lindsay Snyder is a 75 y.o. female presenting on 05/18/2020 for Anxiety   Currently managed on Lexapro 10 mg which was initiated in early March 2022 via My Chart given continued anxiety symptoms per patient's daughter despite Zoloft 100 mg.  During that message chain her daughter endorsed continued abdominal pain and diarrhea for which CT scan was ordered. CT scan of the abdomen/pelvis was negative.  Today she endorses an improvement in her abdominal symptoms since coming off Zoloft. Overall her anxiety and sleep have improved since switching to Lexapro 10 mg. Positive effects from Lexapro include better sleep, less mind racing thoughts, is feeling less stress/anxious, less worry. Her family has noticed an improvement as well, but continue to notice some symptoms.   She continues to meet with her therapist, has another appointment for later this week. She's not sure if she's meshing well with her therapist but does plan on continuing sessions.        Relevant past  medical, surgical, family and social history reviewed and updated as indicated. Interim medical history since our last visit reviewed. Allergies and medications reviewed and updated. Outpatient Medications Prior to Visit  Medication Sig Dispense Refill  . CALCIUM PO Take by mouth. Osteovin    . Cholecalciferol (VITAMIN D3) 1000 units CAPS Take by mouth.    . fluticasone (FLONASE) 50 MCG/ACT nasal spray Place 1 spray into both nostrils 2 (two) times daily. 16 g 0  . levocetirizine (XYZAL) 5 MG tablet TAKE 1 TABLET(5 MG) BY MOUTH EVERY EVENING FOR ALLERGIES 30 tablet 0  . Magnesium Gluconate (MAGNESIUM 27) 500 (27 Mg) MG TABS as needed.     . Multiple Vitamins-Minerals (MULTIVITAMIN ADULT PO) Take by mouth. Neuro Vite    . mupirocin nasal ointment (BACTROBAN) 2 % Place 1 application into the nose 2 (two) times daily. For about 5-7 days. 10 g 0  . escitalopram (LEXAPRO) 10 MG tablet Take 1 tablet (10 mg total) by mouth daily. For anxiety. 90 tablet 0   No facility-administered medications prior to visit.     Per HPI unless specifically indicated in ROS section below Review of Systems Objective:  Ht 4\' 11"  (1.499 m)   Wt 201 lb (91.2 kg)   BMI 40.60 kg/m   Wt Readings from Last 3 Encounters:  05/18/20 201 lb (91.2 kg)  03/01/20 201 lb 1.9 oz (91.2 kg)  01/14/20 196 lb (88.9 kg)       Physical exam: Gen: alert, NAD, not ill appearing Pulm: speaks in complete sentences without increased work  of breathing Psych: normal mood, normal thought content      Results for orders placed or performed during the hospital encounter of 04/18/20  I-STAT creatinine  Result Value Ref Range   Creatinine, Ser 0.90 0.44 - 1.00 mg/dL   Assessment & Plan:   Problem List Items Addressed This Visit      Other   Insomnia due to stress    Improving with Lexapro 10 mg, continue same.      Relevant Medications   escitalopram (LEXAPRO) 10 MG tablet   GAD (generalized anxiety disorder)    Improving  on lexapro 10 mg, no more GI side effects since coming off Zoloft.  Continue Lexapro 10 mg, sent refills to [facility] as Rx is too expensive at [facility]. She will update if medication remains too expensive.   Continue therapy sessions. She will update with any changes.      Relevant Medications   escitalopram (LEXAPRO) 10 MG tablet   Generalized abdominal pain    Significant improvement since stopping Zoloft. CT abdomen/pelvis negative.          Meds ordered this encounter  Medications  . escitalopram (LEXAPRO) 10 MG tablet    Sig: Take 1 tablet (10 mg total) by mouth daily. For anxiety.    Dispense:  90 tablet    Refill:  1    Order Specific Question:   Supervising Provider    Answer:   BEDSOLE, AMY E [[id number]]   No orders of the defined types were placed in this encounter.   I discussed the assessment and treatment plan with the patient. The patient was provided an opportunity to ask questions and all were answered. The patient agreed with the plan and demonstrated an understanding of the instructions. The patient was advised to call back or seek an in-person evaluation if the symptoms worsen or if the condition fails to improve as anticipated.  Follow up plan:  Continue taking Lexapro 10 mg daily for anxiety and sleep.  It was a pleasure to see you today! Allie Bossier, NP-C   Pleas Koch, NP

## 2020-05-18 NOTE — Assessment & Plan Note (Signed)
Significant improvement since stopping Zoloft. CT abdomen/pelvis negative.

## 2020-05-18 NOTE — Patient Instructions (Signed)
  Continue taking Lexapro 10 mg daily for anxiety and sleep.  It was a pleasure to see you today! Allie Bossier, NP-C

## 2020-05-18 NOTE — Assessment & Plan Note (Signed)
Improving on lexapro 10 mg, no more GI side effects since coming off Zoloft.  Continue Lexapro 10 mg, sent refills to [facility] as Rx is too expensive at [facility]. She will update if medication remains too expensive.   Continue therapy sessions. She will update with any changes.

## 2020-05-20 ENCOUNTER — Ambulatory Visit (INDEPENDENT_AMBULATORY_CARE_PROVIDER_SITE_OTHER): Payer: Medicare Other | Admitting: Psychology

## 2020-05-20 DIAGNOSIS — F411 Generalized anxiety disorder: Secondary | ICD-10-CM

## 2020-06-17 ENCOUNTER — Ambulatory Visit: Payer: Medicare Other | Admitting: Psychology

## 2020-06-17 ENCOUNTER — Ambulatory Visit (INDEPENDENT_AMBULATORY_CARE_PROVIDER_SITE_OTHER): Payer: Medicare Other | Admitting: Psychology

## 2020-06-17 DIAGNOSIS — F411 Generalized anxiety disorder: Secondary | ICD-10-CM

## 2020-07-05 ENCOUNTER — Other Ambulatory Visit: Payer: Self-pay

## 2020-07-05 ENCOUNTER — Other Ambulatory Visit: Payer: Self-pay | Admitting: Primary Care

## 2020-07-05 ENCOUNTER — Ambulatory Visit (INDEPENDENT_AMBULATORY_CARE_PROVIDER_SITE_OTHER): Payer: Medicare Other | Admitting: Primary Care

## 2020-07-05 ENCOUNTER — Encounter: Payer: Self-pay | Admitting: Primary Care

## 2020-07-05 VITALS — BP 136/78 | HR 62 | Temp 98.6°F | Ht 59.0 in | Wt 210.0 lb

## 2020-07-05 DIAGNOSIS — R413 Other amnesia: Secondary | ICD-10-CM

## 2020-07-05 DIAGNOSIS — F411 Generalized anxiety disorder: Secondary | ICD-10-CM

## 2020-07-05 DIAGNOSIS — F5102 Adjustment insomnia: Secondary | ICD-10-CM

## 2020-07-05 DIAGNOSIS — K219 Gastro-esophageal reflux disease without esophagitis: Secondary | ICD-10-CM | POA: Diagnosis not present

## 2020-07-05 LAB — VITAMIN B12: Vitamin B-12: >1550 pg/mL — ABNORMAL HIGH (ref 211–911)

## 2020-07-05 LAB — BASIC METABOLIC PANEL
BUN: 23 mg/dL (ref 6–23)
CO2: 28 mEq/L (ref 19–32)
Calcium: 9.3 mg/dL (ref 8.4–10.5)
Chloride: 104 mEq/L (ref 96–112)
Creatinine, Ser: 0.95 mg/dL (ref 0.40–1.20)
GFR: 58.44 mL/min — ABNORMAL LOW (ref >60.00)
Glucose, Bld: 75 mg/dL (ref 70–99)
Potassium: 4.2 mEq/L (ref 3.5–5.1)
Sodium: 142 mEq/L (ref 135–145)

## 2020-07-05 LAB — TSH: TSH: 3.37 u[IU]/mL (ref 0.35–4.50)

## 2020-07-05 MED ORDER — FAMOTIDINE 20 MG PO TABS: 20.0000 mg | ORAL_TABLET | Freq: Every day | ORAL | 0 refills | Status: DC

## 2020-07-05 MED ORDER — MEMANTINE HCL 5 MG PO TABS: 5.0000 mg | ORAL_TABLET | Freq: Two times a day (BID) | ORAL | 0 refills | Status: DC

## 2020-07-05 NOTE — Patient Instructions (Addendum)
Stop by the lab prior to leaving today. I will notify you of your results once received.   Start memantine (Namenda) 5 mg once daily for memory. You can increase to twice daily after two weeks if no problems.   Please update me in a few months.  It was a pleasure to see you today!

## 2020-07-05 NOTE — Assessment & Plan Note (Signed)
Improving and sleeping much better! Continue lexapro 10 mg and therapy.

## 2020-07-05 NOTE — Assessment & Plan Note (Signed)
Seems to be experiencing GI side effects from Lexapro, better than when on Zoloft.  Rx for famotidine 20 mg sent to pharmacy. She will update.

## 2020-07-05 NOTE — Progress Notes (Signed)
Subjective:    Patient ID: Lindsay Snyder, female    DOB: 1945/05/07, 75 y.o.   MRN: [id number]  HPI  Lindsay Snyder is a very pleasant 75 y.o. female with a history of cardiac murmur, GAD, generalized abdominal pain, hyperlipidemia who presents today with her daughter to discuss memory changes.  She is currently managed on Lexapro 10 mg for which she has been taking for chronic anxiety. Previously managed on Zoloft for which caused GI upset so we had to discontinue.   Her daughter contacted [place] a few weeks back with concerns for chronic and progressing memory changes for which she and her brother have noted in their mother for over one year.   Today they patient endorses that she cannot remember certain words at times during conversations with her children. She denies misplacing things, remembering names of common people. She has noticed feeling dizzy at times since taking Lexapro 10 mg. Overall anxiety has improved since her last visit, isn't sure if this is due to the medication or due to her husband leaving. Sleep is better. She continues to meet with her therapist which has been somewhat beneficial.   She does have indigestion that began with Zoloft and improved some with Lexapro. She is taking Tums numerous times daily.  Her daughter mentions a slow and steady decline in her memory. She doesn't allow her mother to drive as she's fearful that her mother won't remember how to get home. She also mentions her mother will forget conversations that have occurred the day prior and even five minutes prior; also repetitive questioning, can't get the name of her son out at times.    Review of Systems  Respiratory: Negative for shortness of breath.   Cardiovascular: Negative for chest pain.  Neurological:       Memory changes, See HPI  Psychiatric/Behavioral: Negative for sleep disturbance. The patient is nervous/anxious.        See HPI, anxiety overall improved         Past Medical  History:  Diagnosis Date  . Appendicitis 8   . Polyp of colon    Benign    Social History   Socioeconomic History  . Marital status: Married    Spouse name: Not on file  . Number of children: Not on file  . Years of education: Not on file  . Highest education level: Not on file  Occupational History  . Not on file  Tobacco Use  . Smoking status: Never Smoker  . Smokeless tobacco: Never Used  Vaping Use  . Vaping Use: Never used  Substance and Sexual Activity  . Alcohol use: No    Alcohol/week: 0.0 standard drinks  . Drug use: No  . Sexual activity: Not Currently  Other Topics Concern  . Not on file  Social History Narrative   From [place], moved to [place] for her ill daughter, moved to [place], moved back to [place].   Married. Retired.   Lives in [place].   Enjoys babysitting, writing, reading, cooking.      Social Determinants of Health   Financial Resource Strain: Not on file  Food Insecurity: Not on file  Transportation Needs: Not on file  Physical Activity: Not on file  Stress: Not on file  Social Connections: Not on file  Intimate Partner Violence: Not on file    Past Surgical History:  Procedure Laterality Date  . ABDOMINAL HYSTERECTOMY    . APPENDECTOMY    . BREAST EXCISIONAL BIOPSY  Right    + 20 years neg  . COLONOSCOPY WITH PROPOFOL N/A 12/16/2017   Procedure: COLONOSCOPY WITH PROPOFOL;  Surgeon: Jonathon Bellows, MD;  Location: [facility] ENDOSCOPY;  Service: Gastroenterology;  Laterality: N/A;    Family History  Problem Relation Age of Onset  . Diabetes Mother        Deceased  . Heart attack Father 63       Deceased  . Cancer Father        Colon  . Cancer Sister        Unusual blood cancer  . Breast cancer Paternal Aunt     Allergies  Allergen Reactions  . Crestor [Rosuvastatin Calcium] Rash and Other (See Comments)    Myalgia  . Tylenol [Acetaminophen] Rash    Current Outpatient Medications on File Prior to Visit  Medication Sig Dispense  Refill  . CALCIUM PO Take by mouth. Osteovin    . Cholecalciferol (VITAMIN D3) 1000 units CAPS Take by mouth.    . escitalopram (LEXAPRO) 10 MG tablet Take 1 tablet (10 mg total) by mouth daily. For anxiety. 90 tablet 1  . fluticasone (FLONASE) 50 MCG/ACT nasal spray Place 1 spray into both nostrils 2 (two) times daily. 16 g 0  . levocetirizine (XYZAL) 5 MG tablet TAKE 1 TABLET(5 MG) BY MOUTH EVERY EVENING FOR ALLERGIES 30 tablet 0  . Magnesium Gluconate (MAGNESIUM 27) 500 (27 Mg) MG TABS as needed.     . Multiple Vitamins-Minerals (MULTIVITAMIN ADULT PO) Take by mouth. Neuro Vite    . mupirocin nasal ointment (BACTROBAN) 2 % Place 1 application into the nose 2 (two) times daily. For about 5-7 days. 10 g 0   No current facility-administered medications on file prior to visit.    BP 136/78   Pulse 62   Temp 98.6 F (37 C) (Temporal)   Ht 4\' 11"  (1.499 m)   Wt 210 lb (95.3 kg)   SpO2 97%   BMI 42.41 kg/m  Objective:   Physical Exam Cardiovascular:     Rate and Rhythm: Normal rate and regular rhythm.     Heart sounds: Murmur heard.    Pulmonary:     Effort: Pulmonary effort is normal.  Musculoskeletal:     Cervical back: Neck supple.  Skin:    General: Skin is warm and dry.           Assessment & Plan:      This visit occurred during the SARS-CoV-2 public health emergency.  Safety protocols were in place, including screening questions prior to the visit, additional usage of staff PPE, and extensive cleaning of exam room while observing appropriate contact time as indicated for disinfecting solutions.

## 2020-07-05 NOTE — Assessment & Plan Note (Signed)
Improving per patient and daughter on Lexapro 10 mg and with therapy, continue same.

## 2020-07-05 NOTE — Assessment & Plan Note (Addendum)
Chronic for 1+ years, progressing per daughter. MMSE score of 22 today. She does appear to have some difficulty with words today.  Long discussion regarding treatment options which included low dose Namenda 5 mg and/or neurology consultation. She opts to try medication first.   Will start Namenda 5 mg once daily x 2 weeks, then increase to BID thereafter. She will update [place] in a few months. Continue Lexapro 10 mg.   Will check B12 level, BMP, and TSH today,

## 2020-07-07 ENCOUNTER — Ambulatory Visit: Payer: Medicare Other | Admitting: Primary Care

## 2020-07-07 DIAGNOSIS — R413 Other amnesia: Secondary | ICD-10-CM

## 2020-07-15 ENCOUNTER — Ambulatory Visit (INDEPENDENT_AMBULATORY_CARE_PROVIDER_SITE_OTHER): Payer: Medicare Other | Admitting: Psychology

## 2020-07-15 DIAGNOSIS — F411 Generalized anxiety disorder: Secondary | ICD-10-CM

## 2020-07-29 DIAGNOSIS — G479 Sleep disorder, unspecified: Secondary | ICD-10-CM | POA: Diagnosis not present

## 2020-07-29 DIAGNOSIS — R413 Other amnesia: Secondary | ICD-10-CM | POA: Diagnosis not present

## 2020-08-18 ENCOUNTER — Ambulatory Visit (INDEPENDENT_AMBULATORY_CARE_PROVIDER_SITE_OTHER): Payer: Medicare Other | Admitting: Psychology

## 2020-08-18 DIAGNOSIS — F411 Generalized anxiety disorder: Secondary | ICD-10-CM | POA: Diagnosis not present

## 2020-08-19 ENCOUNTER — Other Ambulatory Visit: Payer: Self-pay | Admitting: Primary Care

## 2020-08-19 DIAGNOSIS — Z1231 Encounter for screening mammogram for malignant neoplasm of breast: Secondary | ICD-10-CM

## 2020-09-08 ENCOUNTER — Other Ambulatory Visit: Payer: Self-pay | Admitting: Physician Assistant

## 2020-09-08 DIAGNOSIS — I639 Cerebral infarction, unspecified: Secondary | ICD-10-CM

## 2020-09-15 ENCOUNTER — Ambulatory Visit (INDEPENDENT_AMBULATORY_CARE_PROVIDER_SITE_OTHER): Payer: Medicare Other | Admitting: Psychology

## 2020-09-15 DIAGNOSIS — F411 Generalized anxiety disorder: Secondary | ICD-10-CM

## 2020-09-20 ENCOUNTER — Ambulatory Visit
Admission: RE | Admit: 2020-09-20 | Discharge: 2020-09-20 | Disposition: A | Discharge status: Home / Self Care | Payer: Medicare Other | Source: Ambulatory Visit | Attending: Physician Assistant | Admitting: Physician Assistant

## 2020-09-20 DIAGNOSIS — I639 Cerebral infarction, unspecified: Secondary | ICD-10-CM | POA: Diagnosis not present

## 2020-09-20 DIAGNOSIS — R413 Other amnesia: Secondary | ICD-10-CM | POA: Diagnosis not present

## 2020-09-20 DIAGNOSIS — G479 Sleep disorder, unspecified: Secondary | ICD-10-CM | POA: Diagnosis not present

## 2020-09-20 DIAGNOSIS — I6782 Cerebral ischemia: Secondary | ICD-10-CM | POA: Diagnosis not present

## 2020-09-22 ENCOUNTER — Other Ambulatory Visit: Payer: Self-pay

## 2020-09-22 ENCOUNTER — Ambulatory Visit
Admission: RE | Admit: 2020-09-22 | Discharge: 2020-09-22 | Disposition: A | Discharge status: Home / Self Care | Payer: Medicare Other | Source: Ambulatory Visit | Attending: Primary Care | Admitting: Primary Care

## 2020-09-22 DIAGNOSIS — Z1231 Encounter for screening mammogram for malignant neoplasm of breast: Secondary | ICD-10-CM | POA: Diagnosis not present

## 2020-10-13 ENCOUNTER — Ambulatory Visit: Payer: Medicare Other | Admitting: Psychology

## 2020-10-14 ENCOUNTER — Ambulatory Visit (INDEPENDENT_AMBULATORY_CARE_PROVIDER_SITE_OTHER): Payer: Medicare Other | Admitting: Primary Care

## 2020-10-14 ENCOUNTER — Other Ambulatory Visit: Payer: Self-pay

## 2020-10-14 VITALS — BP 122/70 | HR 61 | Temp 97.8°F | Ht 59.0 in | Wt 227.0 lb

## 2020-10-14 DIAGNOSIS — Z23 Encounter for immunization: Secondary | ICD-10-CM | POA: Diagnosis not present

## 2020-10-14 DIAGNOSIS — H60501 Unspecified acute noninfective otitis externa, right ear: Secondary | ICD-10-CM | POA: Diagnosis not present

## 2020-10-14 DIAGNOSIS — R300 Dysuria: Secondary | ICD-10-CM | POA: Insufficient documentation

## 2020-10-14 DIAGNOSIS — H6090 Unspecified otitis externa, unspecified ear: Secondary | ICD-10-CM | POA: Insufficient documentation

## 2020-10-14 HISTORY — DX: Unspecified otitis externa, unspecified ear: H60.90

## 2020-10-14 LAB — POC URINALSYSI DIPSTICK (AUTOMATED)
Bilirubin, UA: NEGATIVE
Blood, UA: POSITIVE
Glucose, UA: NEGATIVE
Ketones, UA: NEGATIVE
Leukocytes, UA: NEGATIVE
Nitrite, UA: NEGATIVE
Protein, UA: NEGATIVE
Spec Grav, UA: <=1.005 — AB (ref 1.010–1.025)
Urobilinogen, UA: 0.2 E.U./dL
pH, UA: 6 (ref 5.0–8.0)

## 2020-10-14 MED ORDER — AMOXICILLIN-POT CLAVULANATE 875-125 MG PO TABS: 1.0000 | ORAL_TABLET | Freq: Two times a day (BID) | ORAL | 0 refills | Status: DC

## 2020-10-14 NOTE — Patient Instructions (Signed)
Start Augmentin antibiotics for the infection Take 1 tablet by mouth twice daily for 7 days.  Start Advil 400 - 600 mg every 8 hours for pain and inflammation.  We will be in touch with the urine test results.   It was a pleasure to see you today!

## 2020-10-14 NOTE — Progress Notes (Signed)
Subjective:    Patient ID: Lindsay Snyder, female    DOB: 09-12-1945, 75 y.o.   MRN: [id number]  HPI  Lindsay Snyder is a very pleasant 75 y.o. female with a history of acute cystitis, anxiety, memory changes, GERD, environmental allergies who presents today to discuss urinary frequency and ear pain.  1) Otalgia: Acute to the right ear with pressure, aching, pounding, echo sounds, pain. This began 2-3 days ago, cannot touch her ear without pain. She's been taking Advil and using a heating pad with some improvement.   She has a chronic history of sinus pressure, worse over the last few months. Is not using Flonase.  She denies cough. She felt warm last week, didn't take her temperature.   2) Urinary Urgency: Also with dysuria, urinary frequency, is having difficulty getting to the bathroom in time.   Urinary incontinence and urgency have been chronic for months, worse over the last 3-4 weeks. She is having to wear depends and pads recently.   She is drinking more water over the last few weeks. She denies hematuria, vaginal discharge, or recurrent discharge, flank pain.   BP Readings from Last 3 Encounters:  10/14/20 122/70  07/05/20 136/78  03/01/20 122/80        Review of Systems  Constitutional:  Negative for fever.  HENT:  Positive for ear pain and sinus pressure.   Genitourinary:  Positive for dysuria, frequency and urgency. Negative for flank pain, hematuria and vaginal discharge.        Past Medical History:  Diagnosis Date   Appendicitis 8    Polyp of colon    Benign    Social History   Socioeconomic History   Marital status: Married    Spouse name: Not on file   Number of children: Not on file   Years of education: Not on file   Highest education level: Not on file  Occupational History   Not on file  Tobacco Use   Smoking status: Never   Smokeless tobacco: Never  Vaping Use   Vaping Use: Never used  Substance and Sexual Activity   Alcohol use: No     Alcohol/week: 0.0 standard drinks   Drug use: No   Sexual activity: Not Currently  Other Topics Concern   Not on file  Social History Narrative   From [place], moved to [place] for her ill daughter, moved to [place], moved back to [place].   Married. Retired.   Lives in [place].   Enjoys babysitting, writing, reading, cooking.      Social Determinants of Health   Financial Resource Strain: Not on file  Food Insecurity: Not on file  Transportation Needs: Not on file  Physical Activity: Not on file  Stress: Not on file  Social Connections: Not on file  Intimate Partner Violence: Not on file    Past Surgical History:  Procedure Laterality Date   ABDOMINAL HYSTERECTOMY     APPENDECTOMY     BREAST EXCISIONAL BIOPSY Right    + 20 years neg   COLONOSCOPY WITH PROPOFOL N/A 12/16/2017   Procedure: COLONOSCOPY WITH PROPOFOL;  Surgeon: Jonathon Bellows, MD;  Location: [facility] ENDOSCOPY;  Service: Gastroenterology;  Laterality: N/A;    Family History  Problem Relation Age of Onset   Diabetes Mother        Deceased   Heart attack Father 66       Deceased   Cancer Father        Colon  Cancer Sister        Unusual blood cancer   Breast cancer Paternal Aunt     Allergies  Allergen Reactions   Crestor [Rosuvastatin Calcium] Rash and Other (See Comments)    Myalgia   Tylenol [Acetaminophen] Rash    Current Outpatient Medications on File Prior to Visit  Medication Sig Dispense Refill   CALCIUM PO Take by mouth. Osteovin     Cholecalciferol (VITAMIN D3) 1000 units CAPS Take by mouth.     escitalopram (LEXAPRO) 10 MG tablet TAKE 1 TABLET(10 MG) BY MOUTH DAILY FOR ANXIETY 90 tablet 3   famotidine (PEPCID) 20 MG tablet Take 1 tablet (20 mg total) by mouth daily. For heartburn. 90 tablet 0   fluticasone (FLONASE) 50 MCG/ACT nasal spray Place 1 spray into both nostrils 2 (two) times daily. 16 g 0   levocetirizine (XYZAL) 5 MG tablet TAKE 1 TABLET(5 MG) BY MOUTH EVERY EVENING FOR ALLERGIES  30 tablet 0   Magnesium Gluconate (MAGNESIUM 27) 500 (27 Mg) MG TABS as needed.      Multiple Vitamins-Minerals (MULTIVITAMIN ADULT PO) Take by mouth. Neuro Vite     mupirocin nasal ointment (BACTROBAN) 2 % Place 1 application into the nose 2 (two) times daily. For about 5-7 days. 10 g 0   No current facility-administered medications on file prior to visit.    BP 122/70 (BP Location: Left Arm, Patient Position: Sitting, Cuff Size: Large)   Pulse 61   Temp 97.8 F (36.6 C) (Temporal)   Ht '4\' 11"'$  (1.499 m)   Wt 227 lb (103 kg)   SpO2 96%   BMI 45.85 kg/m  Objective:   Physical Exam Constitutional:      Appearance: She is not ill-appearing.     Comments: Appears uncomfortable, holding hand over right ear.   Cardiovascular:     Rate and Rhythm: Normal rate and regular rhythm.  Pulmonary:     Effort: Pulmonary effort is normal.     Breath sounds: Normal breath sounds.  Abdominal:     Tenderness: There is no right CVA tenderness or left CVA tenderness.  Musculoskeletal:     Cervical back: Neck supple.  Skin:    General: Skin is warm and dry.          Assessment & Plan:      This visit occurred during the SARS-CoV-2 public health emergency.  Safety protocols were in place, including screening questions prior to the visit, additional usage of staff PPE, and extensive cleaning of exam room while observing appropriate contact time as indicated for disinfecting solutions.

## 2020-10-14 NOTE — Assessment & Plan Note (Addendum)
Exam today consistent for otitis externa.  Rx for Augmentin course sent to pharmacy, BID x 7 days. Discussed use of Advil.  Follow up as needed.

## 2020-10-14 NOTE — Assessment & Plan Note (Addendum)
With other symptoms suspicious for cystitis. History of cystitis.   UA today with trace blood, otherwise negative. Will add culture.  Treating with Augmentin for ear infection today. This would cover potential cystitis.

## 2020-10-15 LAB — URINE CULTURE
MICRO NUMBER:: 12384935
SPECIMEN QUALITY:: ADEQUATE

## 2020-10-16 DIAGNOSIS — R32 Unspecified urinary incontinence: Secondary | ICD-10-CM

## 2020-10-18 MED ORDER — OXYBUTYNIN CHLORIDE ER 5 MG PO TB24: 5.0000 mg | ORAL_TABLET | Freq: Every day | ORAL | 0 refills | Status: DC

## 2020-10-18 NOTE — Addendum Note (Signed)
Addended by: Pleas Koch on: 10/18/2020 01:53 PM   Modules accepted: Orders

## 2020-10-19 DIAGNOSIS — R32 Unspecified urinary incontinence: Secondary | ICD-10-CM

## 2020-10-20 ENCOUNTER — Telehealth: Payer: Self-pay

## 2020-10-20 ENCOUNTER — Other Ambulatory Visit: Payer: Self-pay

## 2020-10-20 ENCOUNTER — Ambulatory Visit (INDEPENDENT_AMBULATORY_CARE_PROVIDER_SITE_OTHER): Payer: Medicare Other

## 2020-10-20 DIAGNOSIS — Z0000 Encounter for general adult medical examination without abnormal findings: Secondary | ICD-10-CM | POA: Diagnosis not present

## 2020-10-20 NOTE — Telephone Encounter (Signed)
1) Please mail Advanced directives paperwork to the patient. Patient does not want DNR form  2) patient would like to get suggestions/recommendations on what type of exercises/nutrition/home specific work outs she can do with her health issues and her age. Any help would be appreciated. Currently is not doing any kind of exercises besides home management-cleaning, cooking, etc.

## 2020-10-20 NOTE — Telephone Encounter (Signed)
I have added new meds to list for patient.

## 2020-10-20 NOTE — Progress Notes (Signed)
Subjective:   Lindsay Snyder is a 75 y.o. female who presents for Medicare Annual (Subsequent) preventive examination.  I connected with  Lindsay Snyder on 10/20/20 by an audio only telemedicine application and verified that I am speaking with the correct person using two identifiers.   I discussed the limitations, risks, security and privacy concerns of performing an evaluation and management service by telephone and the availability of in person appointments. I also discussed with the patient that there may be a patient responsible charge related to this service. The patient expressed understanding and verbally consented to this telephonic visit.  Location of Patient: Home Location of Provider: Office  List any persons and their role that are participating in the visit with the patient.   Review of Systems    Defer to PCP       Objective:    There were no vitals filed for this visit. There is no height or weight on file to calculate BMI.  Advanced Directives 10/20/2020 07/17/2018 12/16/2017 07/08/2017 11/13/2016 06/29/2016 04/12/2015  Does Patient Have a Medical Advance Directive? No Yes No Yes Yes Yes No  Type of Advance Directive (No Data) [place];Living will - [place];Living will - [place] -  Does patient want to make changes to medical advance directive? Yes (MAU/Ambulatory/Procedural Areas - Information given) No - Patient declined - - - - -  Copy of Healthcare Power of Attorney in Chart? - No - copy requested - Yes - No - copy requested -  Would patient like information on creating a medical advance directive? - - - No - Patient declined - - -    Current Medications (verified) Outpatient Encounter Medications as of 10/20/2020  Medication Sig   [DISCONTINUED] amoxicillin-clavulanate (AUGMENTIN) 875-125 MG tablet Take 1 tablet by mouth 2 (two) times daily.   CALCIUM PO Take by mouth. Osteovin   Cholecalciferol  (VITAMIN D3) 1000 units CAPS Take by mouth.   donepezil (ARICEPT) 5 MG tablet Take 5 mg by mouth.   escitalopram (LEXAPRO) 10 MG tablet TAKE 1 TABLET(10 MG) BY MOUTH DAILY FOR ANXIETY   escitalopram (LEXAPRO) 20 MG tablet Take 20 mg by mouth at bedtime.   famotidine (PEPCID) 20 MG tablet Take 1 tablet (20 mg total) by mouth daily. For heartburn.   fluticasone (FLONASE) 50 MCG/ACT nasal spray Place 1 spray into both nostrils 2 (two) times daily.   levocetirizine (XYZAL) 5 MG tablet TAKE 1 TABLET(5 MG) BY MOUTH EVERY EVENING FOR ALLERGIES   Magnesium Gluconate (MAGNESIUM 27) 500 (27 Mg) MG TABS as needed.    mirtazapine (REMERON) 7.5 MG tablet Take 7.5 mg by mouth at bedtime.   Multiple Vitamins-Minerals (MULTIVITAMIN ADULT PO) Take by mouth. Neuro Vite   mupirocin nasal ointment (BACTROBAN) 2 % Place 1 application into the nose 2 (two) times daily. For about 5-7 days.   oxybutynin (DITROPAN-XL) 5 MG 24 hr tablet Take 1 tablet (5 mg total) by mouth at bedtime. For urinary incontinence.   No facility-administered encounter medications on file as of 10/20/2020.    Allergies (verified) Crestor [rosuvastatin calcium] and Tylenol [acetaminophen]   History: Past Medical History:  Diagnosis Date   Allergy 010150   Anxiety 08/30/2019   Appendicitis 8    GERD (gastroesophageal reflux disease) 01/30/2019   Polyp of colon    Benign   Past Surgical History:  Procedure Laterality Date   ABDOMINAL HYSTERECTOMY     APPENDECTOMY  BREAST EXCISIONAL BIOPSY Right    + 20 years neg   COLONOSCOPY WITH PROPOFOL N/A 12/16/2017   Procedure: COLONOSCOPY WITH PROPOFOL;  Surgeon: Jonathon Bellows, MD;  Location: [facility] ENDOSCOPY;  Service: Gastroenterology;  Laterality: N/A;   Family History  Problem Relation Age of Onset   Diabetes Mother        Deceased   Heart attack Father 46       Deceased   Cancer Father        Colon   Breast cancer Paternal Aunt    Social History   Socioeconomic History    Marital status: Single    Spouse name: Not on file   Number of children: Not on file   Years of education: Not on file   Highest education level: Not on file  Occupational History   Not on file  Tobacco Use   Smoking status: Never   Smokeless tobacco: Never  Vaping Use   Vaping Use: Never used  Substance and Sexual Activity   Alcohol use: No   Drug use: Never   Sexual activity: Not Currently  Other Topics Concern   Not on file  Social History Narrative   From [place], moved to [place] for her ill daughter, moved to [place], moved back to [place].   Retired.   Lives in [place].   Enjoys babysitting, writing, reading, cooking.      Social Determinants of Health   Financial Resource Strain: Low Risk    Difficulty of Paying Living Expenses: Not very hard  Food Insecurity: No Food Insecurity   Worried About Charity fundraiser in the Last Year: Never true   Ran Out of Food in the Last Year: Never true  Transportation Needs: No Transportation Needs   Lack of Transportation (Medical): No   Lack of Transportation (Non-Medical): No  Physical Activity: Inactive   Days of Exercise per Week: 0 days   Minutes of Exercise per Session: 0 min  Stress: No Stress Concern Present   Feeling of Stress : Not at all  Social Connections: Moderately Isolated   Frequency of Communication with Friends and Family: Once a week   Frequency of Social Gatherings with Friends and Family: Never   Attends Religious Services: More than 4 times per year   Active Member of [employer] or Organizations: Yes   Attends Music therapist: More than 4 times per year   Marital Status: Separated    Tobacco Counseling Counseling given: Not Answered   Clinical Intake:  Pre-visit preparation completed: Yes  Pain : No/denies pain     BMI - recorded: 45.85 Nutritional Status: BMI > 30  Obese Nutritional Risks: Nausea/ vomitting/ diarrhea (due to antibiotics) Diabetes: No  How often do you need to  have someone help you when you read instructions, pamphlets, or other written materials from your doctor or pharmacy?: 1 - Never What is the last grade level you completed in school?: 12th grade  Diabetic? No  Interpreter Needed?: No      Activities of Daily Living In your present state of health, do you have any difficulty performing the following activities: 10/20/2020 12/25/2019  Hearing? Y N  Vision? N N  Comment wears eye glasses -  Difficulty concentrating or making decisions? Y N  Comment remembering sometimes an issue -  Walking or climbing stairs? N N  Dressing or bathing? N N  Doing errands, shopping? Y N  Some recent data might be hidden    Patient  Care Team: Pleas Koch, NP as PCP - General (Nurse Practitioner) Ophthalmologist at [facility] in [place] [employer] [place]- per patient Dr Potter-Neurologist  Indicate any recent Medical Services you may have received from other than [facility] providers in the past year (date may be approximate).     Assessment:   This is a routine wellness examination for Lindsay Snyder.  Hearing/Vision screen No results found.  Dietary issues and exercise activities discussed: Current Exercise Habits: The patient does not participate in regular exercise at present   Goals Addressed   None   Depression Screen PHQ 2/9 Scores 10/20/2020 12/25/2019 07/21/2018 07/17/2018 07/08/2017 06/29/2016 05/18/2015  PHQ - 2 Score 0 6 2 0 0 0 1  PHQ- 9 Score - 8 9 0 0 - -    Fall Risk Fall Risk  10/20/2020 12/25/2019 07/17/2018 07/08/2017 06/29/2016  Falls in the past year? 0 1 0 No No  Number falls in past yr: 0 0 - - -  Injury with Fall? 0 1 - - -  Risk for fall due to : History of fall(s) Impaired balance/gait - - -  Risk for fall due to: Comment - - - - -    FALL RISK PREVENTION PERTAINING TO THE HOME:  Any stairs in or around the home? Yes  If so, are there any without handrails? Yes  Home free of loose throw rugs in walkways, pet beds, electrical cords, etc?  Yes  Adequate lighting in your home to reduce risk of falls? Yes   ASSISTIVE DEVICES UTILIZED TO PREVENT FALLS:  Life alert? No  Use of a cane, walker or w/c? No  Grab bars in the bathroom? No  Shower chair or bench in shower? No  Elevated toilet seat or a handicapped toilet? No   TIMED UP AND GO:  Was the test performed?  n/a .    Cognitive Function: MMSE - Mini Mental State Exam 07/05/2020 07/17/2018 07/08/2017 06/29/2016  Orientation to time 5 5 5 5   Orientation to Place 5 5 5 5   Registration 3 3 3 3   Attention/ Calculation 0 0 0 0  Recall 1 3 2 3   Language- name 2 objects 2 0 0 0  Language- repeat 0 1 1 1   Language- follow 3 step command 3 0 3 3  Language- read & follow direction 1 0 0 0  Write a sentence 1 0 0 0  Copy design 1 0 0 0  Total score 22 17 19 20      6CIT Screen 10/20/2020  What Year? 0 points  What month? 0 points  What time? 0 points  Count back from 20 4 points  Months in reverse 4 points  Repeat phrase 10 points  Total Score 18    Immunizations Immunization History  Administered Date(s) Administered   Fluad Quad(high Dose 65+) 12/25/2019, 10/14/2020   PFIZER(Purple Top)SARS-COV-2 Vaccination 08/18/2019, 09/08/2019, 03/24/2020   Pneumococcal Conjugate-13 05/18/2015   Pneumococcal Polysaccharide-23 08/04/2012   Tdap 05/17/2014    TDAP status: Up to date  Flu Vaccine status: Up to date  Pneumococcal vaccine status: Up to date  Covid-19 vaccine status: Completed vaccines  Qualifies for Shingles Vaccine? Yes   Zostavax completed No   Shingrix Completed?: No.    Education has been provided regarding the importance of this vaccine. Patient has been advised to call insurance company to determine out of pocket expense if they have not yet received this vaccine. Advised may also receive vaccine at local pharmacy or Health Dept. Verbalized acceptance  and understanding.  Screening Tests Health Maintenance  Topic Date Due   Zoster Vaccines- Shingrix  (1 of 2) Never done   COLONOSCOPY (Pts 45-86yrs Insurance coverage will need to be confirmed)  12/16/2020   TETANUS/TDAP  05/16/2024   INFLUENZA VACCINE  Completed   DEXA SCAN  Completed   Hepatitis C Screening  Completed   HPV VACCINES  Aged Out   COVID-19 Vaccine  Discontinued    Health Maintenance  Health Maintenance Due  Topic Date Due   Zoster Vaccines- Shingrix (1 of 2) Never done    Colorectal cancer screening: Type of screening: Colonoscopy. Completed 12/16/2017. Repeat every 3 years  Mammogram status: Completed 09/24/20. Repeat every year  Bone Density status: Completed 07/22/17. Results reflect: Bone density results: OSTEOPENIA. Repeat every 2 years.  Lung Cancer Screening: (Low Dose CT Chest recommended if Age 83-80 years, 30 pack-year currently smoking OR have quit w/in 15years.) does not qualify.   Lung Cancer Screening Referral: n/a  Additional Screening:  Hepatitis C Screening: does qualify; Completed 06/29/16  Vision Screening: Recommended annual ophthalmology exams for early detection of glaucoma and other disorders of the eye. Is the patient up to date with their annual eye exam?  Yes  Who is the provider or what is the name of the office in which the patient attends annual eye exams? Ocie Doyne at [facility] in [place] thinks that's the location  Dental Screening: Recommended annual dental exams for proper oral hygiene  Community Resource Referral / Chronic Care Management: CRR required this visit?  No   CCM required this visit?  No      Plan:     I have personally reviewed and noted the following in the patient's chart:   Medical and social history Use of alcohol, tobacco or illicit drugs  Current medications and supplements including opioid prescriptions.  Functional ability and status Nutritional status Physical activity Advanced directives List of other physicians Hospitalizations, surgeries, and ER visits in previous 12  months Screenings to include cognitive, depression, and falls Referrals and appointments  In addition, I have reviewed and discussed with patient certain preventive protocols, quality metrics, and best practice recommendations. A written personalized care plan for preventive services as well as general preventive health recommendations were provided to patient.     Kris Mouton, CMA   10/20/2020   Nurse Notes: Non-Face to Face 35 minutes visit.   Ms. Frenkel , Thank you for taking time to come for your Medicare Wellness Visit. I appreciate your ongoing commitment to your health goals. Please review the following plan we discussed and let me know if I can assist you in the future.      This is a list of the screening recommended for you and due dates:  Health Maintenance  Topic Date Due   Zoster (Shingles) Vaccine (1 of 2) Never done   Colon Cancer Screening  12/16/2020   Tetanus Vaccine  05/16/2024   Flu Shot  Completed   DEXA scan (bone density measurement)  Completed   Hepatitis C Screening: USPSTF Recommendation to screen - Ages 22-79 yo.  Completed   HPV Vaccine  Aged Out   COVID-19 Vaccine  Discontinued

## 2020-10-20 NOTE — Patient Instructions (Signed)
Health Maintenance, Female Adopting a healthy lifestyle and getting preventive care are important in promoting health and wellness. Ask your health care provider about: The right schedule for you to have regular tests and exams. Things you can do on your own to prevent diseases and keep yourself healthy. What should I know about diet, weight, and exercise? Eat a healthy diet  Eat a diet that includes plenty of vegetables, fruits, low-fat dairy products, and lean protein. Do not eat a lot of foods that are high in solid fats, added sugars, or sodium. Maintain a healthy weight Body mass index (BMI) is used to identify weight problems. It estimates body fat based on height and weight. Your health care provider can help determine your BMI and help you achieve or maintain a healthy weight. Get regular exercise Get regular exercise. This is one of the most important things you can do for your health. Most adults should: Exercise for at least 150 minutes each week. The exercise should increase your heart rate and make you sweat (moderate-intensity exercise). Do strengthening exercises at least twice a week. This is in addition to the moderate-intensity exercise. Spend less time sitting. Even light physical activity can be beneficial. Watch cholesterol and blood lipids Have your blood tested for lipids and cholesterol at 76 years of age, then have this test every 5 years. Have your cholesterol levels checked more often if: Your lipid or cholesterol levels are high. You are older than 76 years of age. You are at high risk for heart disease. What should I know about cancer screening? Depending on your health history and family history, you may need to have cancer screening at various ages. This may include screening for: Breast cancer. Cervical cancer. Colorectal cancer. Skin cancer. Lung cancer. What should I know about heart disease, diabetes, and high blood pressure? Blood pressure and heart  disease High blood pressure causes heart disease and increases the risk of stroke. This is more likely to develop in people who have high blood pressure readings, are of African descent, or are overweight. Have your blood pressure checked: Every 3-5 years if you are 18-39 years of age. Every year if you are 40 years old or older. Diabetes Have regular diabetes screenings. This checks your fasting blood sugar level. Have the screening done: Once every three years after age 40 if you are at a normal weight and have a low risk for diabetes. More often and at a younger age if you are overweight or have a high risk for diabetes. What should I know about preventing infection? Hepatitis B If you have a higher risk for hepatitis B, you should be screened for this virus. Talk with your health care provider to find out if you are at risk for hepatitis B infection. Hepatitis C Testing is recommended for: Everyone born from 1945 through 1965. Anyone with known risk factors for hepatitis C. Sexually transmitted infections (STIs) Get screened for STIs, including gonorrhea and chlamydia, if: You are sexually active and are younger than 76 years of age. You are older than 76 years of age and your health care provider tells you that you are at risk for this type of infection. Your sexual activity has changed since you were last screened, and you are at increased risk for chlamydia or gonorrhea. Ask your health care provider if you are at risk. Ask your health care provider about whether you are at high risk for HIV. Your health care provider may recommend a prescription medicine   to help prevent HIV infection. If you choose to take medicine to prevent HIV, you should first get tested for HIV. You should then be tested every 3 months for as long as you are taking the medicine. Pregnancy If you are about to stop having your period (premenopausal) and you may become pregnant, seek counseling before you get  pregnant. Take 400 to 800 micrograms (mcg) of folic acid every day if you become pregnant. Ask for birth control (contraception) if you want to prevent pregnancy. Osteoporosis and menopause Osteoporosis is a disease in which the bones lose minerals and strength with aging. This can result in bone fractures. If you are 65 years old or older, or if you are at risk for osteoporosis and fractures, ask your health care provider if you should: Be screened for bone loss. Take a calcium or vitamin D supplement to lower your risk of fractures. Be given hormone replacement therapy (HRT) to treat symptoms of menopause. Follow these instructions at home: Lifestyle Do not use any products that contain nicotine or tobacco, such as cigarettes, e-cigarettes, and chewing tobacco. If you need help quitting, ask your health care provider. Do not use street drugs. Do not share needles. Ask your health care provider for help if you need support or information about quitting drugs. Alcohol use Do not drink alcohol if: Your health care provider tells you not to drink. You are pregnant, may be pregnant, or are planning to become pregnant. If you drink alcohol: Limit how much you use to 0-1 drink a day. Limit intake if you are breastfeeding. Be aware of how much alcohol is in your drink. In the [place] one drink equals one 12 oz bottle of beer (355 mL), one 5 oz glass of wine (148 mL), or one 1 oz glass of hard liquor (44 mL). General instructions Schedule regular health, dental, and eye exams. Stay current with your vaccines. Tell your health care provider if: You often feel depressed. You have ever been abused or do not feel safe at home. Summary Adopting a healthy lifestyle and getting preventive care are important in promoting health and wellness. Follow your health care provider's instructions about healthy diet, exercising, and getting tested or screened for diseases. Follow your health care provider's  instructions on monitoring your cholesterol and blood pressure. This information is not intended to replace advice given to you by your health care provider. Make sure you discuss any questions you have with your health care provider. Document Revised: 03/25/2020 Document Reviewed: 01/08/2018 Elsevier Patient Education  2022 Elsevier Inc.  

## 2020-10-23 NOTE — Telephone Encounter (Signed)
Please mail advance directives paperwork.  Walking daily, 5000 steps daily to start, work towards 10,000 steps daily.  Fresh vegetables, lean protein, whole grains. Less packaged/processed foods, sugary foods, sugary drinks.

## 2020-10-24 NOTE — Telephone Encounter (Signed)
Left message to return call to our office.  Need to give information and verify mailing address.

## 2020-10-27 DIAGNOSIS — G479 Sleep disorder, unspecified: Secondary | ICD-10-CM | POA: Diagnosis not present

## 2020-10-27 DIAGNOSIS — R413 Other amnesia: Secondary | ICD-10-CM | POA: Diagnosis not present

## 2020-10-31 NOTE — Telephone Encounter (Signed)
Left message to return call to our office.  Sent my chart message to call

## 2020-11-04 ENCOUNTER — Ambulatory Visit: Payer: Medicare Other | Admitting: Psychology

## 2020-11-04 NOTE — Telephone Encounter (Signed)
Have called x3 and left message to call our office. I have also sent my chart message. No call back ok to mail info as well as the Advanced directives as requested to home address we have on file?

## 2020-11-04 NOTE — Telephone Encounter (Signed)
Received call back from daughter. Got correct address and updated patient chart. Will send information requested.  Also reviewed diet information requested on last call. No further actions needed.

## 2020-11-06 MED ORDER — OXYBUTYNIN CHLORIDE ER 10 MG PO TB24: 10.0000 mg | ORAL_TABLET | Freq: Every day | ORAL | 0 refills | Status: DC

## 2020-11-16 ENCOUNTER — Other Ambulatory Visit: Payer: Self-pay | Admitting: Primary Care

## 2020-11-16 DIAGNOSIS — R32 Unspecified urinary incontinence: Secondary | ICD-10-CM

## 2020-12-09 ENCOUNTER — Telehealth: Payer: Self-pay | Admitting: Gastroenterology

## 2020-12-09 DIAGNOSIS — Z86010 Personal history of colonic polyps: Secondary | ICD-10-CM

## 2020-12-14 DIAGNOSIS — R32 Unspecified urinary incontinence: Secondary | ICD-10-CM

## 2020-12-15 ENCOUNTER — Other Ambulatory Visit: Payer: Self-pay

## 2020-12-15 MED ORDER — MIRABEGRON ER 25 MG PO TB24: 25.0000 mg | ORAL_TABLET | Freq: Every day | ORAL | 0 refills | Status: DC

## 2020-12-15 MED ORDER — NA SULFATE-K SULFATE-MG SULF 17.5-3.13-1.6 GM/177ML PO SOLN: 354.0000 mL | Freq: Once | ORAL | 0 refills | Status: AC

## 2020-12-15 NOTE — Telephone Encounter (Signed)
Patient was contacted to schedule her colonoscopy, therefore, she was scheduled to be done on 01/02/2021 with DR. Anna.

## 2020-12-20 MED ORDER — OXYBUTYNIN CHLORIDE ER 10 MG PO TB24
10.0000 mg | ORAL_TABLET | Freq: Every day | ORAL | 1 refills | Status: DC
Start: 2020-12-20 — End: 2021-03-21

## 2020-12-30 ENCOUNTER — Other Ambulatory Visit: Payer: Self-pay

## 2020-12-30 ENCOUNTER — Encounter: Payer: Self-pay | Admitting: Gastroenterology

## 2020-12-30 DIAGNOSIS — Z86010 Personal history of colonic polyps: Secondary | ICD-10-CM

## 2021-01-02 ENCOUNTER — Ambulatory Visit
Admission: RE | Admit: 2021-01-02 | Discharge: 2021-01-02 | Disposition: A | Discharge status: Home / Self Care | Payer: Medicare Other | Source: Ambulatory Visit | Attending: Gastroenterology | Admitting: Gastroenterology

## 2021-01-02 ENCOUNTER — Ambulatory Visit: Payer: Medicare Other | Admitting: Certified Registered"

## 2021-01-02 ENCOUNTER — Encounter
Admission: RE | Disposition: A | Discharge status: Home / Self Care | Payer: Self-pay | Source: Ambulatory Visit | Attending: Gastroenterology

## 2021-01-02 DIAGNOSIS — Z1211 Encounter for screening for malignant neoplasm of colon: Secondary | ICD-10-CM | POA: Insufficient documentation

## 2021-01-02 DIAGNOSIS — D122 Benign neoplasm of ascending colon: Secondary | ICD-10-CM | POA: Diagnosis not present

## 2021-01-02 DIAGNOSIS — K635 Polyp of colon: Secondary | ICD-10-CM | POA: Diagnosis not present

## 2021-01-02 DIAGNOSIS — Z86010 Personal history of colonic polyps: Secondary | ICD-10-CM

## 2021-01-02 HISTORY — DX: Malignant (primary) neoplasm, unspecified: C80.1

## 2021-01-02 HISTORY — PX: COLONOSCOPY WITH PROPOFOL: SHX5780

## 2021-01-02 SURGERY — COLONOSCOPY WITH PROPOFOL
Anesthesia: General

## 2021-01-02 MED ORDER — SODIUM CHLORIDE 0.9 % IV SOLN
INTRAVENOUS | Status: DC
  Administered 2021-01-02: 20 mL/h via INTRAVENOUS

## 2021-01-02 MED ORDER — LIDOCAINE 2% (20 MG/ML) 5 ML SYRINGE
INTRAMUSCULAR | Status: DC | PRN
  Administered 2021-01-02: 20 mg via INTRAVENOUS

## 2021-01-02 MED ORDER — PROPOFOL 10 MG/ML IV BOLUS
INTRAVENOUS | Status: DC | PRN
  Administered 2021-01-02 (×2): 70 mg via INTRAVENOUS

## 2021-01-02 MED ORDER — PROPOFOL 500 MG/50ML IV EMUL
INTRAVENOUS | Status: DC | PRN
  Administered 2021-01-02: 120 ug/kg/min via INTRAVENOUS

## 2021-01-02 NOTE — Anesthesia Postprocedure Evaluation (Signed)
Anesthesia Post Note  Patient: Lindsay Snyder  Procedure(s) Performed: COLONOSCOPY WITH PROPOFOL  Patient location during evaluation: PACU Anesthesia Type: General Level of consciousness: awake and alert Pain management: pain level controlled Vital Signs Assessment: post-procedure vital signs reviewed and stable Respiratory status: spontaneous breathing, nonlabored ventilation, respiratory function stable and patient connected to nasal cannula oxygen Cardiovascular status: blood pressure returned to baseline and stable Postop Assessment: no apparent nausea or vomiting Anesthetic complications: no   No notable events documented.   Last Vitals:  Vitals:   01/02/21 1150 01/02/21 1200  BP: (!) 146/81 (!) 166/82  Pulse: (!) 58 (!) 54  Resp: (!) 21 13  Temp: (!) 36.1 C   SpO2: 100% 97%    Last Pain:  Vitals:   01/02/21 1150  TempSrc: Temporal  PainSc: 0-No pain                 Molli Barrows

## 2021-01-02 NOTE — Op Note (Signed)
[facility] Gastroenterology Patient Name: Lindsay Snyder Procedure Date: 01/02/2021 11:08 AM MRN: [id number] Account #: [id number] Date of Birth: [birth year] Admit Type: Outpatient Age: 75 Room: [facility] ENDO ROOM 4 Gender: Female Note Status: Finalized Instrument Name: Jasper Riling [id number] Procedure:             Colonoscopy Indications:           Surveillance: Personal history of adenomatous polyps                         on last colonoscopy 3 years ago Providers:             Jonathon Bellows MD, MD Referring MD:          Pleas Koch (Referring MD) Medicines:             Monitored Anesthesia Care Complications:         No immediate complications. Procedure:             Pre-Anesthesia Assessment:                        - Prior to the procedure, a History and Physical was                         performed, and patient medications, allergies and                         sensitivities were reviewed. The patient's tolerance                         of previous anesthesia was reviewed.                        - The risks and benefits of the procedure and the                         sedation options and risks were discussed with the                         patient. All questions were answered and informed                         consent was obtained.                        - ASA Grade Assessment: II - A patient with mild                         systemic disease.                        After obtaining informed consent, the colonoscope was                         passed under direct vision. Throughout the procedure,                         the patient's blood pressure, pulse, and oxygen                         saturations  were monitored continuously. The                         Colonoscope was introduced through the anus and                         advanced to the the cecum, identified by the                         appendiceal orifice. The colonoscopy was performed                          with ease. The patient tolerated the procedure well.                         The quality of the bowel preparation was good. Findings:      The perianal and digital rectal examinations were normal.      Four sessile polyps were found in the ascending colon. The polyps were 6       to 10 mm in size. These polyps were removed with a cold snare. Resection       and retrieval were complete. To prevent bleeding after the polypectomy,       one hemostatic clip was successfully placed. There was no bleeding       during, or at the end, of the procedure.      The exam was otherwise without abnormality on direct and retroflexion       views. Impression:            - Four 6 to 10 mm polyps in the ascending colon,                         removed with a cold snare. Resected and retrieved.                         Clip was placed.                        - The examination was otherwise normal on direct and                         retroflexion views. Recommendation:        - Discharge patient to home (with escort).                        - Resume previous diet.                        - Continue present medications.                        - Await pathology results.                        - Repeat colonoscopy in 3 years for surveillance based                         on clinical status at that time. Procedure Code(s):     --- Professional ---  45385, Colonoscopy, flexible; with removal of                         tumor(s), polyp(s), or other lesion(s) by snare                         technique Diagnosis Code(s):     --- Professional ---                        Z86.010, Personal history of colonic polyps                        K63.5, Polyp of colon CPT copyright 2019 American Medical Association. All rights reserved. The codes documented in this report are preliminary and upon coder review may  be revised to meet current compliance requirements. Jonathon Bellows, MD Jonathon Bellows  MD, MD 01/02/2021 11:50:26 AM This report has been signed electronically. Number of Addenda: 0 Note Initiated On: 01/02/2021 11:08 AM Scope Withdrawal Time: 0 hours 16 minutes 36 seconds  Total Procedure Duration: 0 hours 20 minutes 50 seconds  Estimated Blood Loss:  Estimated blood loss: none.      [facility]

## 2021-01-02 NOTE — Transfer of Care (Signed)
Immediate Anesthesia Transfer of Care Note  Patient: Lindsay Snyder  Procedure(s) Performed: COLONOSCOPY WITH PROPOFOL  Patient Location: Endoscopy Unit  Anesthesia Type:General  Level of Consciousness: awake  Airway & Oxygen Therapy: Patient Spontanous Breathing  Post-op Assessment: Report given to RN and Post -op Vital signs reviewed and stable  Post vital signs: Reviewed  Last Vitals:  Vitals Value Taken Time  BP 146/81 01/02/21 1150  Temp    Pulse 58 01/02/21 1150  Resp 21 01/02/21 1150  SpO2 100 % 01/02/21 1150  Vitals shown include unvalidated device data.  Last Pain:  Vitals:   01/02/21 1059  TempSrc: Temporal  PainSc: 0-No pain         Complications: No notable events documented.

## 2021-01-02 NOTE — Anesthesia Preprocedure Evaluation (Signed)
Anesthesia Evaluation  Patient identified by MRN, date of birth, ID band Patient awake    Reviewed: Allergy & Precautions, H&P , NPO status , Patient's Chart, lab work & pertinent test results, reviewed documented beta blocker date and time   Airway Mallampati: II   Neck ROM: full    Dental  (+) Poor Dentition   Pulmonary neg pulmonary ROS,    Pulmonary exam normal        Cardiovascular Exercise Tolerance: Good Normal cardiovascular exam+ Valvular Problems/Murmurs  Rhythm:regular Rate:Normal     Neuro/Psych PSYCHIATRIC DISORDERS Anxiety negative neurological ROS     GI/Hepatic Neg liver ROS, GERD  Medicated,  Endo/Other  negative endocrine ROS  Renal/GU negative Renal ROS  negative genitourinary   Musculoskeletal   Abdominal   Peds  Hematology negative hematology ROS (+)   Anesthesia Other Findings Past Medical History: 031594: Allergy 08/30/2019: Anxiety 8 : Appendicitis No date: Cancer ([place]) 01/30/2019: GERD (gastroesophageal reflux disease) No date: Polyp of colon     Comment:  Benign Past Surgical History: No date: ABDOMINAL HYSTERECTOMY No date: APPENDECTOMY No date: BREAST EXCISIONAL BIOPSY; Right     Comment:  + 20 years neg 12/16/2017: COLONOSCOPY WITH PROPOFOL; N/A     Comment:  Procedure: COLONOSCOPY WITH PROPOFOL;  Surgeon: Jonathon Bellows, MD;  [facility] ENDOSCOPY;  Service:               Gastroenterology;  Laterality: N/A; BMI    Body Mass Index: 34.33 kg/m     Reproductive/Obstetrics negative OB ROS                             Anesthesia Physical Anesthesia Plan  ASA: 3  Anesthesia Plan: General   Post-op Pain Management:    Induction:   PONV Risk Score and Plan:   Airway Management Planned:   Additional Equipment:   Intra-op Plan:   Post-operative Plan:   Informed Consent: I have reviewed the patients History and Physical, chart,  labs and discussed the procedure including the risks, benefits and alternatives for the proposed anesthesia with the patient or authorized representative who has indicated his/her understanding and acceptance.     Dental Advisory Given  Plan Discussed with: CRNA  Anesthesia Plan Comments:         Anesthesia Quick Evaluation
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
## 2021-01-03 ENCOUNTER — Encounter: Payer: Self-pay | Admitting: Gastroenterology

## 2021-01-03 LAB — SURGICAL PATHOLOGY

## 2021-01-04 ENCOUNTER — Encounter: Payer: Self-pay | Admitting: Gastroenterology

## 2021-02-02 DIAGNOSIS — R413 Other amnesia: Secondary | ICD-10-CM | POA: Diagnosis not present

## 2021-02-02 DIAGNOSIS — G479 Sleep disorder, unspecified: Secondary | ICD-10-CM | POA: Diagnosis not present

## 2021-02-03 ENCOUNTER — Other Ambulatory Visit: Payer: Self-pay

## 2021-02-03 ENCOUNTER — Other Ambulatory Visit: Payer: Medicare Other

## 2021-02-03 ENCOUNTER — Ambulatory Visit (INDEPENDENT_AMBULATORY_CARE_PROVIDER_SITE_OTHER): Payer: Medicare Other | Admitting: Primary Care

## 2021-02-03 ENCOUNTER — Encounter: Payer: Self-pay | Admitting: Primary Care

## 2021-02-03 DIAGNOSIS — M19041 Primary osteoarthritis, right hand: Secondary | ICD-10-CM | POA: Diagnosis not present

## 2021-02-03 DIAGNOSIS — R32 Unspecified urinary incontinence: Secondary | ICD-10-CM

## 2021-02-03 DIAGNOSIS — M19042 Primary osteoarthritis, left hand: Secondary | ICD-10-CM | POA: Diagnosis not present

## 2021-02-03 DIAGNOSIS — R1030 Lower abdominal pain, unspecified: Secondary | ICD-10-CM | POA: Diagnosis not present
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
## 2021-02-03 NOTE — Patient Instructions (Signed)
Return the urine specimen as scheduled.  Stay off of WebMD.  I'll be in touch again soon!

## 2021-02-03 NOTE — Assessment & Plan Note (Signed)
Improved since resuming oxybutynin XL 10 mg, continue same.

## 2021-02-03 NOTE — Assessment & Plan Note (Signed)
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 HPI and exam not consistent for RA, although I did offer testing for which she and her daughter decline.  Discussed conservative treatment.  Consider rheumatology if symptoms worsen, she agrees.

## 2021-02-03 NOTE — Assessment & Plan Note (Signed)
Seems mostly to pelvis.   Will check UA given history of UTI's. She cannot provide a sample today, kit provided to return.  Exam benign. Discussed to refrain from using WebMD for medical advise and to see myself or one of my colleagues for symptoms.   No evidence of diverticulosis to lower abdomen from recent colonoscopy   Await lab results. Consider imaging.  Patient and daughter agreed.

## 2021-02-06 ENCOUNTER — Other Ambulatory Visit: Payer: Medicare Other

## 2021-02-07 ENCOUNTER — Other Ambulatory Visit (INDEPENDENT_AMBULATORY_CARE_PROVIDER_SITE_OTHER): Payer: Medicare Other

## 2021-02-07 DIAGNOSIS — R1030 Lower abdominal pain, unspecified: Secondary | ICD-10-CM | POA: Diagnosis not present

## 2021-02-07 LAB — URINALYSIS, ROUTINE W REFLEX MICROSCOPIC
Bilirubin Urine: NEGATIVE
Hgb urine dipstick: NEGATIVE
Ketones, ur: NEGATIVE
Leukocytes,Ua: NEGATIVE
Nitrite: NEGATIVE
Specific Gravity, Urine: 1.015 (ref 1.000–1.030)
Total Protein, Urine: NEGATIVE
Urine Glucose: NEGATIVE
Urobilinogen, UA: 0.2 (ref 0.0–1.0)
pH: 6 (ref 5.0–8.0)

## 2021-02-08 LAB — URINE CULTURE
MICRO NUMBER:: 12850919
SPECIMEN QUALITY:: ADEQUATE

## 2021-03-20 ENCOUNTER — Other Ambulatory Visit: Payer: Self-pay | Admitting: Primary Care

## 2021-03-20 DIAGNOSIS — R32 Unspecified urinary incontinence: Secondary | ICD-10-CM

## 2021-03-21 MED ORDER — OXYBUTYNIN CHLORIDE ER 10 MG PO TB24: 10.0000 mg | ORAL_TABLET | Freq: Every day | ORAL | 1 refills | Status: DC

## 2021-04-12 ENCOUNTER — Ambulatory Visit: Payer: Medicare Other | Admitting: Primary Care

## 2021-06-05 ENCOUNTER — Other Ambulatory Visit: Payer: Self-pay

## 2021-06-05 ENCOUNTER — Emergency Department (HOSPITAL_COMMUNITY): Payer: Medicare HMO

## 2021-06-05 ENCOUNTER — Encounter (HOSPITAL_COMMUNITY): Payer: Self-pay | Admitting: Emergency Medicine

## 2021-06-05 ENCOUNTER — Emergency Department (HOSPITAL_COMMUNITY)
Admission: EM | Admit: 2021-06-05 | Discharge: 2021-06-05 | Disposition: A | Discharge status: Home / Self Care | Payer: Medicare HMO | Attending: Emergency Medicine | Admitting: Emergency Medicine

## 2021-06-05 DIAGNOSIS — M79604 Pain in right leg: Secondary | ICD-10-CM | POA: Insufficient documentation

## 2021-06-05 DIAGNOSIS — S01511A Laceration without foreign body of lip, initial encounter: Secondary | ICD-10-CM | POA: Diagnosis not present

## 2021-06-05 DIAGNOSIS — S0990XA Unspecified injury of head, initial encounter: Secondary | ICD-10-CM | POA: Diagnosis not present

## 2021-06-05 DIAGNOSIS — S0993XA Unspecified injury of face, initial encounter: Secondary | ICD-10-CM | POA: Diagnosis not present

## 2021-06-05 DIAGNOSIS — Y92480 Sidewalk as the place of occurrence of the external cause: Secondary | ICD-10-CM | POA: Diagnosis not present

## 2021-06-05 DIAGNOSIS — W19XXXA Unspecified fall, initial encounter: Secondary | ICD-10-CM

## 2021-06-05 DIAGNOSIS — S0501XA Injury of conjunctiva and corneal abrasion without foreign body, right eye, initial encounter: Secondary | ICD-10-CM | POA: Insufficient documentation

## 2021-06-05 DIAGNOSIS — W010XXA Fall on same level from slipping, tripping and stumbling without subsequent striking against object, initial encounter: Secondary | ICD-10-CM | POA: Insufficient documentation

## 2021-06-05 DIAGNOSIS — S8001XA Contusion of right knee, initial encounter: Secondary | ICD-10-CM | POA: Diagnosis not present

## 2021-06-05 DIAGNOSIS — S069X9A Unspecified intracranial injury with loss of consciousness of unspecified duration, initial encounter: Secondary | ICD-10-CM | POA: Diagnosis not present

## 2021-06-05 DIAGNOSIS — S0081XA Abrasion of other part of head, initial encounter: Secondary | ICD-10-CM | POA: Diagnosis not present

## 2021-06-05 DIAGNOSIS — S01512A Laceration without foreign body of oral cavity, initial encounter: Secondary | ICD-10-CM

## 2021-06-05 DIAGNOSIS — S199XXA Unspecified injury of neck, initial encounter: Secondary | ICD-10-CM | POA: Diagnosis not present

## 2021-06-05 DIAGNOSIS — M25461 Effusion, right knee: Secondary | ICD-10-CM | POA: Diagnosis not present

## 2021-06-05 DIAGNOSIS — M1711 Unilateral primary osteoarthritis, right knee: Secondary | ICD-10-CM | POA: Diagnosis not present

## 2021-06-05 MED ORDER — LIDOCAINE-EPINEPHRINE (PF) 2 %-1:200000 IJ SOLN
10.0000 mL | Freq: Once | INTRAMUSCULAR | Status: AC
  Administered 2021-06-05: 10 mL
  Filled 2021-06-05: qty 20

## 2021-06-05 MED ORDER — AMOXICILLIN-POT CLAVULANATE 875-125 MG PO TABS: 1.0000 | ORAL_TABLET | Freq: Two times a day (BID) | ORAL | 0 refills | Status: DC

## 2021-06-05 NOTE — ED Provider Notes (Signed)
?[facility] DEPT ?Provider Note ? ? ?CSN: [id number] ?Arrival date & time: 06/05/21  1350 ? ?  ? ?History ? ?Chief Complaint  ?Patient presents with  ? Fall  ? ? ?Lindsay Snyder is a 76 y.o. female who presents the emergency department after mechanical fall this afternoon.  Patient states that she was going to the movie theater with her son, in which she tripped over the sidewalk.  When she fell she hit the right side of her face and mouth, as well as her right knee on the ground.  There is no loss of consciousness.  She denied any prodromal symptoms.  She is having difficulty bearing weight on the right leg due to pain.  No numbness. ? ? ?Fall ? ? ?  ?Home Medications ?Prior to Admission medications   ?Medication Sig Start Date End Date Taking? Authorizing Provider  ?amoxicillin-clavulanate (AUGMENTIN) 875-125 MG tablet Take 1 tablet by mouth every 12 (twelve) hours. 06/05/21  Yes Dean Wonder T, PA-C  ?CALCIUM PO Take by mouth. [place]    [provider]  ?Cholecalciferol (VITAMIN D3) 1000 units CAPS Take by mouth.    [provider]  ?donepezil (ARICEPT) 5 MG tablet Take 5 mg by mouth. 09/29/20   [provider]  ?escitalopram (LEXAPRO) 20 MG tablet Take 20 mg by mouth at bedtime. 09/29/20   [provider]  ?famotidine (PEPCID) 20 MG tablet Take 1 tablet (20 mg total) by mouth daily. For heartburn. 07/05/20   Pleas Koch, NP  ?Magnesium Gluconate (MAGNESIUM 27) 500 (27 Mg) MG TABS as needed.     [provider]  ?memantine (NAMENDA) 5 MG tablet Take '5mg'$  once a day for a week, then increase to '5mg'$  twice daily. 02/02/21   [provider]  ?mirtazapine (REMERON) 15 MG tablet Take 1 tablet by mouth at bedtime. 02/02/21 02/02/22  [provider]  ?Multiple Vitamins-Minerals (MULTIVITAMIN ADULT PO) Take by mouth. Neuro Vite    [provider]  ?oxybutynin (DITROPAN XL) 10 MG 24 hr tablet Take 1 tablet (10 mg total) by  mouth at bedtime. For overactive bladder. 03/21/21   Pleas Koch, NP  ?   ? ?Allergies    ?Crestor [rosuvastatin calcium] and Tylenol [acetaminophen]   ? ?Review of Systems   ?Review of Systems  ?HENT:    ?     Right facial injury  ?Eyes:  Negative for visual disturbance.  ?Musculoskeletal:   ?     Right knee pain  ?Neurological:  Negative for syncope.  ?All other systems reviewed and are negative. ? ?Physical Exam ?Updated Vital Signs ?BP (!) 153/77   Pulse 63   Temp 97.9 ?F (36.6 ?C) (Oral)   Resp 18   SpO2 93%  ?Physical Exam ?Vitals and nursing note reviewed.  ?Constitutional:   ?   Appearance: Normal appearance.  ?HENT:  ?   Head: Normocephalic.  ?   Comments: Superficial abrasions noted just lateral to the right eye and on the right cheek.  Linear laceration noted over the anterior upper lip on the left side.  Frenulum intact.  No communication with the exterior lip.  No broken teeth. ?Eyes:  ?   Conjunctiva/sclera: Conjunctivae normal.  ?Pulmonary:  ?   Effort: Pulmonary effort is normal. No respiratory distress.  ?Musculoskeletal:  ?   Comments: Soft tissue swelling and ecchymoses noted to the medial right knee.  Mild joint effusion noted.  Compartments of the right lower leg soft.  Neurovascularly intact.  No midline spinal tenderness, step-offs or crepitus.  ?Skin: ?   General: Skin is warm and dry.  ?Neurological:  ?   Mental Status: She is alert.  ?Psychiatric:     ?   Mood and Affect: Mood normal.     ?   Behavior: Behavior normal.  ? ? ?ED Results / Procedures / Treatments   ?Labs ?(all labs ordered are listed, but only abnormal results are displayed) ?Labs Reviewed - No data to display ? ?EKG ?None ? ?Radiology ?CT Head Wo Contrast ? ?Result Date: 06/05/2021 ?CLINICAL DATA:  [place] down striking RIGHT side of her face and mouth on the ground, denies loss of consciousness, head trauma, neck trauma, facial trauma, initial encounter. Denies being on blood thinners. EXAM: CT HEAD WITHOUT CONTRAST CT  MAXILLOFACIAL WITHOUT CONTRAST CT CERVICAL SPINE WITHOUT CONTRAST TECHNIQUE: Multidetector CT imaging of the head, cervical spine, and maxillofacial structures were performed using the standard protocol without intravenous contrast. Multiplanar CT image reconstructions of the cervical spine and maxillofacial structures were also generated. RADIATION DOSE REDUCTION: This exam was performed according to the departmental dose-optimization program which includes automated exposure control, adjustment of the mA and/or kV according to patient size and/or use of iterative reconstruction technique. COMPARISON:  None Available. FINDINGS: CT HEAD FINDINGS Brain: Generalized atrophy. Normal ventricular morphology. No midline shift or mass effect. Otherwise normal appearance of brain parenchyma. No intracranial hemorrhage, mass lesion, or evidence of acute infarction. No extra-axial fluid collections. Vascular: No hyperdense vessels. Skull: Intact Other: N/A CT MAXILLOFACIAL FINDINGS Osseous: TMJ alignment normal bilaterally. Beam hardening artifacts of dental origin. Facial bones intact. No facial bone fractures. Orbits: Bony orbits intact. Intraorbital soft tissue planes clear without fluid or pneumatosis. Sinuses: Paranasal sinuses and LEFT mastoid air cells clear. Partial fluid opacification of the inferior RIGHT mastoid air cells noted. Soft tissues: No significant soft tissue abnormalities. CT CERVICAL SPINE FINDINGS Alignment: Normal Skull base and vertebrae: Osseous mineralization normal. Visualized skull base intact. Vertebral body heights maintained. Scattered disc space narrowing. No fracture, subluxation, or bone destruction. Multilevel facet degenerative changes. Soft tissues and spinal canal: Prevertebral soft tissues normal thickness. Atherosclerotic calcifications at carotid bifurcations bilaterally and at the proximal great vessels. Disc levels:  No specific abnormalities Upper chest: Lung apices clear Other:  N/A IMPRESSION: Generalized atrophy. No acute intracranial abnormalities. No acute facial bone abnormalities. Scattered degenerative disc and facet disease changes of the cervical spine. No acute cervical spine abnormalities. Electronically Signed   By: Lavonia Dana M.D.   On: 06/05/2021 16:26  ? ?CT Cervical Spine Wo Contrast ? ?Result Date: 06/05/2021 ?CLINICAL DATA:  [place] down striking RIGHT side of her face and mouth on the ground, denies loss of consciousness, head trauma, neck trauma, facial trauma, initial encounter. Denies being on blood thinners. EXAM: CT HEAD WITHOUT CONTRAST CT MAXILLOFACIAL WITHOUT CONTRAST CT CERVICAL SPINE WITHOUT CONTRAST TECHNIQUE: Multidetector CT imaging of the head, cervical spine, and maxillofacial structures were performed using the standard protocol without intravenous contrast. Multiplanar CT image reconstructions of the cervical spine and maxillofacial structures were also generated. RADIATION DOSE REDUCTION: This exam was performed according to the departmental dose-optimization program which includes automated exposure control, adjustment of the mA and/or kV according to patient size and/or use of iterative reconstruction technique. COMPARISON:  None Available. FINDINGS: CT HEAD FINDINGS Brain: Generalized atrophy. Normal ventricular morphology. No midline shift or mass effect. Otherwise normal appearance of brain parenchyma. No intracranial hemorrhage, mass lesion, or evidence of acute  infarction. No extra-axial fluid collections. Vascular: No hyperdense vessels. Skull: Intact Other: N/A CT MAXILLOFACIAL FINDINGS Osseous: TMJ alignment normal bilaterally. Beam hardening artifacts of dental origin. Facial bones intact. No facial bone fractures. Orbits: Bony orbits intact. Intraorbital soft tissue planes clear without fluid or pneumatosis. Sinuses: Paranasal sinuses and LEFT mastoid air cells clear. Partial fluid opacification of the inferior RIGHT mastoid air cells noted. Soft  tissues: No significant soft tissue abnormalities. CT CERVICAL SPINE FINDINGS Alignment: Normal Skull base and vertebrae: Osseous mineralization normal. Visualized skull base intact. Vertebral body heights

## 2021-06-05 NOTE — Discharge Instructions (Addendum)
You are seen in the emergency department today after fall. ? ?As we discussed the imaging of your head, face, neck did not show any fractures or bleeds.  We have sutured the laceration on the inside of your lip with 2 sutures.  These are sutures that should dissolve in 7-10 days. I would like you to follow up with the dentist about your tooth. ? ?You can take ibuprofen as needed for pain. I am prescribing you antibiotics to prevent infection. It is important you take the entire course! ? ?Continue to monitor how you're doing and return to the ER for new or worsening symptoms.  ?

## 2021-06-05 NOTE — ED Triage Notes (Signed)
Pt reports falling down and hitting her right side of her face and mouth on ground. Pt also c/o right leg pain. Denies LOC and denies blood thinners.  ?

## 2021-06-13 DIAGNOSIS — B9689 Other specified bacterial agents as the cause of diseases classified elsewhere: Secondary | ICD-10-CM | POA: Diagnosis not present

## 2021-06-13 DIAGNOSIS — J208 Acute bronchitis due to other specified organisms: Secondary | ICD-10-CM | POA: Diagnosis not present

## 2021-06-13 DIAGNOSIS — R051 Acute cough: Secondary | ICD-10-CM | POA: Diagnosis not present

## 2021-06-16 NOTE — Progress Notes (Signed)
Review of the note that I cosigned indicates that patient had a fall and injured her face causing laceration. Imaging was performed to rule out traumatic intracranial injury in this 75yo.

## 2021-06-29 ENCOUNTER — Ambulatory Visit (INDEPENDENT_AMBULATORY_CARE_PROVIDER_SITE_OTHER): Payer: Medicare HMO | Admitting: Primary Care

## 2021-06-29 ENCOUNTER — Encounter: Payer: Self-pay | Admitting: Primary Care

## 2021-06-29 VITALS — BP 154/62 | HR 53 | Temp 98.0°F | Ht 64.0 in | Wt 257.0 lb

## 2021-06-29 DIAGNOSIS — R053 Chronic cough: Secondary | ICD-10-CM | POA: Diagnosis not present

## 2021-06-29 DIAGNOSIS — R296 Repeated falls: Secondary | ICD-10-CM | POA: Diagnosis not present

## 2021-06-29 MED ORDER — HYDROCOD POLI-CHLORPHE POLI ER 10-8 MG/5ML PO SUER: 5.0000 mL | Freq: Two times a day (BID) | ORAL | 0 refills | Status: DC | PRN

## 2021-06-29 NOTE — Patient Instructions (Signed)
You may take the cough suppressant every 12 hours as needed for cough and rest. Caution this medication contains codeine which may cause drowsiness.   Increase your famotidine (Pepcid) to 2 tablets daily.  Start taking Allegra daily.   It was a pleasure to see you today!

## 2021-06-29 NOTE — Assessment & Plan Note (Signed)
ED notes, imaging reviewed.  Agreed to provide Rx for Walker. They will take this to her pharmacy or medical supply store.   Consider home health PT.

## 2021-06-29 NOTE — Assessment & Plan Note (Signed)
Suspect post infectoius cough vs reflux or allergy cough; especially since she's been on multiple rounds of two antibiotics.  Increase famotidine to 40 mg daily. Add Allegra daily.   Start Tussionex syrup BID PRN Drowsiness precautions provided.   Consider repeat chest xray if no improvement.
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
## 2021-07-04 DIAGNOSIS — M25561 Pain in right knee: Secondary | ICD-10-CM

## 2021-07-04 DIAGNOSIS — R296 Repeated falls: Secondary | ICD-10-CM

## 2021-07-04 DIAGNOSIS — R053 Chronic cough: Secondary | ICD-10-CM

## 2021-07-06 ENCOUNTER — Ambulatory Visit (INDEPENDENT_AMBULATORY_CARE_PROVIDER_SITE_OTHER)
Admission: RE | Admit: 2021-07-06 | Discharge: 2021-07-06 | Disposition: A | Discharge status: Home / Self Care | Payer: Medicare HMO | Source: Ambulatory Visit | Attending: Primary Care | Admitting: Primary Care

## 2021-07-06 DIAGNOSIS — R053 Chronic cough: Secondary | ICD-10-CM | POA: Diagnosis not present

## 2021-07-06 DIAGNOSIS — R059 Cough, unspecified: Secondary | ICD-10-CM | POA: Diagnosis not present

## 2021-07-07 ENCOUNTER — Ambulatory Visit (INDEPENDENT_AMBULATORY_CARE_PROVIDER_SITE_OTHER)
Admission: RE | Admit: 2021-07-07 | Discharge: 2021-07-07 | Disposition: A | Discharge status: Home / Self Care | Payer: Medicare HMO | Source: Ambulatory Visit | Attending: Primary Care | Admitting: Primary Care

## 2021-07-07 DIAGNOSIS — R296 Repeated falls: Secondary | ICD-10-CM

## 2021-07-07 DIAGNOSIS — M25561 Pain in right knee: Secondary | ICD-10-CM | POA: Diagnosis not present

## 2021-07-08 MED ORDER — HYDROCOD POLI-CHLORPHE POLI ER 10-8 MG/5ML PO SUER: 5.0000 mL | Freq: Two times a day (BID) | ORAL | 0 refills | Status: DC | PRN

## 2021-07-08 NOTE — Telephone Encounter (Signed)
Spoke with patient's daughter Lindsay Snyder - on [place]) via phone. Cough is improving but she is still waking up at night with "coughing fits".   We discussed to stop famotidine and start omeprazole 20 mg BID. I will refill her Tussionex as well.  We will move forward with CT chest.  ED/return precautions provided.

## 2021-08-03 DIAGNOSIS — R413 Other amnesia: Secondary | ICD-10-CM | POA: Diagnosis not present

## 2021-08-03 DIAGNOSIS — F411 Generalized anxiety disorder: Secondary | ICD-10-CM | POA: Diagnosis not present

## 2021-08-03 DIAGNOSIS — G479 Sleep disorder, unspecified: Secondary | ICD-10-CM | POA: Diagnosis not present

## 2021-08-04 ENCOUNTER — Other Ambulatory Visit: Payer: Self-pay | Admitting: Primary Care

## 2021-08-04 DIAGNOSIS — Z1231 Encounter for screening mammogram for malignant neoplasm of breast: Secondary | ICD-10-CM

## 2021-08-12 ENCOUNTER — Other Ambulatory Visit: Payer: Self-pay | Admitting: Primary Care

## 2021-08-12 DIAGNOSIS — R32 Unspecified urinary incontinence: Secondary | ICD-10-CM

## 2021-10-06 DIAGNOSIS — R413 Other amnesia: Secondary | ICD-10-CM | POA: Diagnosis not present

## 2021-10-06 DIAGNOSIS — F411 Generalized anxiety disorder: Secondary | ICD-10-CM | POA: Diagnosis not present

## 2021-10-06 DIAGNOSIS — G479 Sleep disorder, unspecified: Secondary | ICD-10-CM | POA: Diagnosis not present

## 2021-10-12 ENCOUNTER — Ambulatory Visit
Admission: RE | Admit: 2021-10-12 | Discharge: 2021-10-12 | Disposition: A | Discharge status: Home / Self Care | Payer: Medicare HMO | Source: Ambulatory Visit | Attending: Primary Care | Admitting: Primary Care

## 2021-10-12 DIAGNOSIS — Z1231 Encounter for screening mammogram for malignant neoplasm of breast: Secondary | ICD-10-CM | POA: Diagnosis not present

## 2021-11-09 ENCOUNTER — Telehealth: Payer: Self-pay | Admitting: Primary Care

## 2021-11-09 NOTE — Telephone Encounter (Signed)
Left message for patient to call back and schedule the Medicare Annual Wellness Visit (AWV) virtually or by telephone.  Last AWV 10/20/20  Please schedule at anytime with [facility]-Nurse Health Advisor.

## 2021-11-30 DIAGNOSIS — F5102 Adjustment insomnia: Secondary | ICD-10-CM | POA: Diagnosis not present

## 2021-11-30 DIAGNOSIS — F4322 Adjustment disorder with anxiety: Secondary | ICD-10-CM | POA: Diagnosis not present

## 2021-11-30 DIAGNOSIS — G3184 Mild cognitive impairment, so stated: Secondary | ICD-10-CM | POA: Diagnosis not present

## 2021-12-04 ENCOUNTER — Ambulatory Visit: Payer: Medicare HMO

## 2021-12-05 DIAGNOSIS — M25562 Pain in left knee: Secondary | ICD-10-CM | POA: Diagnosis not present

## 2021-12-05 DIAGNOSIS — M25561 Pain in right knee: Secondary | ICD-10-CM | POA: Diagnosis not present

## 2021-12-05 DIAGNOSIS — R2689 Other abnormalities of gait and mobility: Secondary | ICD-10-CM | POA: Diagnosis not present

## 2021-12-06 DIAGNOSIS — G3184 Mild cognitive impairment, so stated: Secondary | ICD-10-CM | POA: Diagnosis not present

## 2021-12-06 DIAGNOSIS — F5102 Adjustment insomnia: Secondary | ICD-10-CM | POA: Diagnosis not present

## 2021-12-06 DIAGNOSIS — F4322 Adjustment disorder with anxiety: Secondary | ICD-10-CM | POA: Diagnosis not present

## 2021-12-07 ENCOUNTER — Telehealth: Payer: Self-pay | Admitting: Primary Care

## 2021-12-07 ENCOUNTER — Ambulatory Visit: Payer: Medicare HMO

## 2021-12-07 NOTE — Telephone Encounter (Signed)
Patient daughter called to cancel the appointment and wanted to know can they reschedule because patient can't do appointment alone.

## 2021-12-08 ENCOUNTER — Ambulatory Visit: Payer: Medicare HMO

## 2021-12-28 ENCOUNTER — Ambulatory Visit: Payer: Medicare HMO

## 2021-12-29 ENCOUNTER — Telehealth: Payer: Self-pay | Admitting: Primary Care

## 2021-12-29 DIAGNOSIS — F419 Anxiety disorder, unspecified: Secondary | ICD-10-CM | POA: Diagnosis not present

## 2021-12-29 DIAGNOSIS — G3184 Mild cognitive impairment, so stated: Secondary | ICD-10-CM | POA: Diagnosis not present

## 2021-12-29 DIAGNOSIS — F5102 Adjustment insomnia: Secondary | ICD-10-CM | POA: Diagnosis not present

## 2021-12-29 NOTE — Telephone Encounter (Signed)
Hi,this patient would like to be rescheduled for her AWV .

## 2022-01-04 ENCOUNTER — Telehealth: Payer: Self-pay | Admitting: Primary Care

## 2022-01-04 NOTE — Telephone Encounter (Signed)
LVM for pt to rtn my call to schedule AWV.

## 2022-01-05 ENCOUNTER — Encounter: Payer: Self-pay | Admitting: Primary Care

## 2022-01-05 ENCOUNTER — Ambulatory Visit (INDEPENDENT_AMBULATORY_CARE_PROVIDER_SITE_OTHER): Payer: Medicare HMO | Admitting: Primary Care

## 2022-01-05 VITALS — BP 128/68 | HR 59 | Temp 97.2°F | Ht 64.0 in | Wt 254.0 lb

## 2022-01-05 DIAGNOSIS — R32 Unspecified urinary incontinence: Secondary | ICD-10-CM | POA: Diagnosis not present

## 2022-01-05 DIAGNOSIS — Z23 Encounter for immunization: Secondary | ICD-10-CM

## 2022-01-05 DIAGNOSIS — F411 Generalized anxiety disorder: Secondary | ICD-10-CM

## 2022-01-05 DIAGNOSIS — Z6841 Body Mass Index (BMI) 40.0 and over, adult: Secondary | ICD-10-CM | POA: Diagnosis not present

## 2022-01-05 DIAGNOSIS — M19042 Primary osteoarthritis, left hand: Secondary | ICD-10-CM

## 2022-01-05 DIAGNOSIS — R3915 Urgency of urination: Secondary | ICD-10-CM

## 2022-01-05 DIAGNOSIS — F5102 Adjustment insomnia: Secondary | ICD-10-CM

## 2022-01-05 DIAGNOSIS — R413 Other amnesia: Secondary | ICD-10-CM | POA: Diagnosis not present

## 2022-01-05 DIAGNOSIS — M19041 Primary osteoarthritis, right hand: Secondary | ICD-10-CM | POA: Diagnosis not present

## 2022-01-05 MED ORDER — MIRABEGRON ER 25 MG PO TB24: 25.0000 mg | ORAL_TABLET | Freq: Every day | ORAL | 0 refills | Status: DC

## 2022-01-05 MED ORDER — DULOXETINE HCL 20 MG PO CPEP: 20.0000 mg | ORAL_CAPSULE | Freq: Every day | ORAL | 0 refills | Status: DC

## 2022-01-05 NOTE — Assessment & Plan Note (Signed)
Deteriorated.  Reviewed office notes from neurology from September 2023.  Will add duloxetine 20 mg daily starting in 1 week.  Her daughter will review this with her neurology provider to ensure no side effects with her current regimen.

## 2022-01-05 NOTE — Patient Instructions (Signed)
Return the urine sample when able.  You will either be contacted via phone regarding your referral to podiatry, or you may receive a letter on your MyChart portal from our referral team with instructions for scheduling an appointment. Please let us know if you have not been contacted by anyone within two weeks.  Start Myrbetriq 25 mg daily for overactive bladder.  Start duloxetine 20 mg daily in 1 week for anxiety.  It was a pleasure to see you today!

## 2022-01-05 NOTE — Assessment & Plan Note (Signed)
Checking UA today to rule out infection.  She cannot void for [place] during her visit so she will return her sample later today.  Stop oxybutynin XL 10 mg. Start Myrbetriq 25 mg daily.  Her daughter will update.

## 2022-01-05 NOTE — Assessment & Plan Note (Signed)
Ongoing.  Following with neurology and memory specialist, office notes reviewed from neurology from September 2023 per care everywhere.  Continue Aricept 10 mg daily, Namenda 5 mg twice daily, lithium 150 mg daily.
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
## 2022-01-05 NOTE — Assessment & Plan Note (Signed)
Following with neurology and memory specialist. Reviewed office notes from neurology through care everywhere from September 2023.  Continue trazodone 100 mg at bedtime. Hopefully, the introduction of duloxetine will help.

## 2022-01-07 LAB — URINE CULTURE
MICRO NUMBER:: 14290185
SPECIMEN QUALITY:: ADEQUATE

## 2022-01-09 DIAGNOSIS — R32 Unspecified urinary incontinence: Secondary | ICD-10-CM

## 2022-01-10 ENCOUNTER — Ambulatory Visit: Payer: Medicare HMO | Admitting: Podiatry

## 2022-01-10 DIAGNOSIS — M79674 Pain in right toe(s): Secondary | ICD-10-CM

## 2022-01-10 DIAGNOSIS — M79675 Pain in left toe(s): Secondary | ICD-10-CM

## 2022-01-10 DIAGNOSIS — B351 Tinea unguium: Secondary | ICD-10-CM | POA: Diagnosis not present

## 2022-01-10 MED ORDER — OXYBUTYNIN CHLORIDE ER 15 MG PO TB24: 15.0000 mg | ORAL_TABLET | Freq: Every day | ORAL | 0 refills | Status: DC

## 2022-01-11 NOTE — Progress Notes (Signed)
  Subjective:  Patient ID: Lindsay Snyder, female    DOB: 01-02-1946,  MRN: [id number]  Chief Complaint  Patient presents with   Nail Problem    np routine nail care/ pts daughter aware will be transitioned to [facility] provider in the future.Marland Kitchendm    76 y.o. female presents with the above complaint. History confirmed with patient.  Toenails are thickened elongated yellow discolored and she and her daughter are unable to cut them  Objective:  Physical Exam: warm, good capillary refill, no trophic changes or ulcerative lesions, normal DP and PT pulses, and normal sensory exam. Left Foot: dystrophic yellowed discolored nail plates with subungual debris Right Foot: dystrophic yellowed discolored nail plates with subungual debris  Assessment:   1. Pain due to onychomycosis of toenails of both feet      Plan:  Patient was evaluated and treated and all questions answered.  Discussed the etiology and treatment options for the condition in detail with the patient. Educated patient on the topical and oral treatment options for mycotic nails. Recommended debridement of the nails today. Sharp and mechanical debridement performed of all painful and mycotic nails today. Nails debrided in length and thickness using a nail nipper to level of comfort. Discussed treatment options including appropriate shoe gear. Follow up as needed for painful nails.    Return in about 3 months (around 04/11/2022) for [place].

## 2022-01-12 ENCOUNTER — Other Ambulatory Visit
Admission: RE | Admit: 2022-01-12 | Discharge: 2022-01-12 | Disposition: A | Discharge status: Home / Self Care | Payer: Medicare HMO | Source: Ambulatory Visit | Attending: Physician Assistant | Admitting: Physician Assistant

## 2022-01-12 DIAGNOSIS — G479 Sleep disorder, unspecified: Secondary | ICD-10-CM | POA: Diagnosis not present

## 2022-01-12 DIAGNOSIS — F411 Generalized anxiety disorder: Secondary | ICD-10-CM | POA: Insufficient documentation

## 2022-01-12 DIAGNOSIS — R413 Other amnesia: Secondary | ICD-10-CM | POA: Diagnosis not present

## 2022-01-12 DIAGNOSIS — R479 Unspecified speech disturbances: Secondary | ICD-10-CM | POA: Diagnosis not present

## 2022-01-12 DIAGNOSIS — Z79899 Other long term (current) drug therapy: Secondary | ICD-10-CM | POA: Insufficient documentation

## 2022-01-12 LAB — LITHIUM LEVEL: Lithium Lvl: 0.13 mmol/L — ABNORMAL LOW (ref 0.60–1.20)

## 2022-01-14 ENCOUNTER — Emergency Department: Payer: Medicare HMO

## 2022-01-14 ENCOUNTER — Emergency Department
Admission: EM | Admit: 2022-01-14 | Discharge: 2022-01-14 | Disposition: A | Discharge status: Home / Self Care | Payer: Medicare HMO | Attending: Emergency Medicine | Admitting: Emergency Medicine

## 2022-01-14 ENCOUNTER — Other Ambulatory Visit: Payer: Self-pay

## 2022-01-14 DIAGNOSIS — W19XXXA Unspecified fall, initial encounter: Secondary | ICD-10-CM

## 2022-01-14 DIAGNOSIS — S199XXA Unspecified injury of neck, initial encounter: Secondary | ICD-10-CM | POA: Diagnosis not present

## 2022-01-14 DIAGNOSIS — S0990XA Unspecified injury of head, initial encounter: Secondary | ICD-10-CM | POA: Diagnosis not present

## 2022-01-14 DIAGNOSIS — W1830XA Fall on same level, unspecified, initial encounter: Secondary | ICD-10-CM | POA: Insufficient documentation

## 2022-01-14 DIAGNOSIS — M25562 Pain in left knee: Secondary | ICD-10-CM | POA: Diagnosis not present

## 2022-01-14 DIAGNOSIS — S8992XA Unspecified injury of left lower leg, initial encounter: Secondary | ICD-10-CM | POA: Diagnosis not present

## 2022-01-14 DIAGNOSIS — M79651 Pain in right thigh: Secondary | ICD-10-CM | POA: Insufficient documentation

## 2022-01-14 DIAGNOSIS — I10 Essential (primary) hypertension: Secondary | ICD-10-CM | POA: Insufficient documentation

## 2022-01-14 DIAGNOSIS — Y92003 Bedroom of unspecified non-institutional (private) residence as the place of occurrence of the external cause: Secondary | ICD-10-CM | POA: Diagnosis not present

## 2022-01-14 DIAGNOSIS — R102 Pelvic and perineal pain: Secondary | ICD-10-CM | POA: Diagnosis not present

## 2022-01-14 DIAGNOSIS — M79652 Pain in left thigh: Secondary | ICD-10-CM | POA: Diagnosis not present

## 2022-01-14 DIAGNOSIS — N3000 Acute cystitis without hematuria: Secondary | ICD-10-CM | POA: Diagnosis not present

## 2022-01-14 DIAGNOSIS — M25561 Pain in right knee: Secondary | ICD-10-CM | POA: Diagnosis not present

## 2022-01-14 DIAGNOSIS — F0280 Dementia in other diseases classified elsewhere without behavioral disturbance: Secondary | ICD-10-CM | POA: Insufficient documentation

## 2022-01-14 DIAGNOSIS — M79604 Pain in right leg: Secondary | ICD-10-CM | POA: Diagnosis present

## 2022-01-14 DIAGNOSIS — G308 Other Alzheimer's disease: Secondary | ICD-10-CM | POA: Diagnosis not present

## 2022-01-14 DIAGNOSIS — M25519 Pain in unspecified shoulder: Secondary | ICD-10-CM | POA: Diagnosis not present

## 2022-01-14 DIAGNOSIS — R55 Syncope and collapse: Secondary | ICD-10-CM | POA: Diagnosis not present

## 2022-01-14 DIAGNOSIS — S8991XA Unspecified injury of right lower leg, initial encounter: Secondary | ICD-10-CM | POA: Diagnosis not present

## 2022-01-14 LAB — CBC
HCT: 50.8 % — ABNORMAL HIGH (ref 36.0–46.0)
Hemoglobin: 16.1 g/dL — ABNORMAL HIGH (ref 12.0–15.0)
MCH: 29 pg (ref 26.0–34.0)
MCHC: 31.7 g/dL (ref 30.0–36.0)
MCV: 91.5 fL (ref 80.0–100.0)
Platelets: 239 10*3/uL (ref 150–400)
RBC: 5.55 MIL/uL — ABNORMAL HIGH (ref 3.87–5.11)
RDW: 12.8 % (ref 11.5–15.5)
WBC: 12.5 10*3/uL — ABNORMAL HIGH (ref 4.0–10.5)
nRBC: 0 % (ref 0.0–0.2)

## 2022-01-14 LAB — URINALYSIS, ROUTINE W REFLEX MICROSCOPIC
Bilirubin Urine: NEGATIVE
Glucose, UA: NEGATIVE mg/dL
Hgb urine dipstick: NEGATIVE
Ketones, ur: NEGATIVE mg/dL
Nitrite: POSITIVE — AB
Protein, ur: NEGATIVE mg/dL
Specific Gravity, Urine: 1.012 (ref 1.005–1.030)
WBC, UA: >50 WBC/hpf — ABNORMAL HIGH (ref 0–5)
pH: 6 (ref 5.0–8.0)

## 2022-01-14 LAB — BASIC METABOLIC PANEL
Anion gap: 11 (ref 5–15)
BUN: 15 mg/dL (ref 8–23)
CO2: 24 mmol/L (ref 22–32)
Calcium: 9.4 mg/dL (ref 8.9–10.3)
Chloride: 106 mmol/L (ref 98–111)
Creatinine, Ser: 1.06 mg/dL — ABNORMAL HIGH (ref 0.44–1.00)
GFR, Estimated: 54 mL/min — ABNORMAL LOW (ref >60)
Glucose, Bld: 105 mg/dL — ABNORMAL HIGH (ref 70–99)
Potassium: 3.3 mmol/L — ABNORMAL LOW (ref 3.5–5.1)
Sodium: 141 mmol/L (ref 135–145)

## 2022-01-14 MED ORDER — LOPERAMIDE HCL 2 MG PO CAPS: 4.0000 mg | ORAL_CAPSULE | Freq: Once | ORAL | Status: DC

## 2022-01-14 MED ORDER — KETOROLAC TROMETHAMINE 30 MG/ML IJ SOLN
15.0000 mg | Freq: Once | INTRAMUSCULAR | Status: AC
  Administered 2022-01-14: 15 mg via INTRAVENOUS
  Filled 2022-01-14: qty 1

## 2022-01-14 MED ORDER — SODIUM CHLORIDE 0.9 % IV BOLUS
1000.0000 mL | Freq: Once | INTRAVENOUS | Status: AC
  Administered 2022-01-14: 1000 mL via INTRAVENOUS

## 2022-01-14 MED ORDER — CEFDINIR 300 MG PO CAPS: 300.0000 mg | ORAL_CAPSULE | Freq: Two times a day (BID) | ORAL | 0 refills | Status: AC

## 2022-01-14 MED ORDER — SODIUM CHLORIDE 0.9 % IV SOLN
1.0000 g | Freq: Once | INTRAVENOUS | Status: AC
  Administered 2022-01-14: 1 g via INTRAVENOUS
  Filled 2022-01-14: qty 10

## 2022-01-14 NOTE — ED Notes (Signed)
Pts pain tolerable at this time. She was placed into a new brief prior to discharge. She was safely placed in a wheelchair and brought to her sons car. She was given d/c papework.

## 2022-01-14 NOTE — ED Triage Notes (Signed)
Daughter with pt. Daughter states she woke up and found pt on the ground. Pt unsure if pt hit her head. Pt is not on a blood thinner. Pt does not recall if it was a mechanical fall or syncopal episode. Pt not complaining of pain

## 2022-01-14 NOTE — ED Provider Notes (Signed)
[facility] Provider Note   Event Date/Time   First MD Initiated Contact with Patient 01/14/22 1202     (approximate) History  Fall (Possible syncopal episode)  HPI Lindsay Snyder is a 76 y.o. female with a stated past medical history of hypertension and Alzheimer's dementia who presents after an unwitnessed fall in her bedroom.  Patient states that she does not fully remember what happened but does endorse bilateral lower extremity pain afterwards.  Patient denies any head trauma or loss of consciousness but once again does not recall what happened during the fall.  Patient denies any subsequent falls.  Patient's caregiver is at bedside and provides additional history that patient has been "out of it" intermittently over the past few days and the primary care physician was concerned about possible "mini strokes" and recommended a CT of the head.  Caregiver has noticed patient having "staring spells" over the past few days where patient becomes unresponsive and seems "out of it" for a few minutes before returning to baseline spontaneously. ROS: Patient currently denies any vision changes, tinnitus, difficulty speaking, facial droop, sore throat, chest pain, shortness of breath, abdominal pain, nausea/vomiting/diarrhea, or numbness/paresthesias in any extremity   Physical Exam  Triage Vital Signs: ED Triage Vitals [01/14/22 0942]  Enc Vitals Group     BP (!) 155/56     Pulse Rate 60     Resp 18     Temp 98 F (36.7 C)     Temp src      SpO2 93 %     Weight      Height      Head Circumference      Peak Flow      Pain Score 0     Pain Loc      Pain Edu?      Excl. in [place]?    Most recent vital signs: Vitals:   01/14/22 1455 01/14/22 1456  BP: 134/80   Pulse: (!) 57   Resp: 16   Temp:  97.6 F (36.4 C)  SpO2: 97%    General: Awake, oriented x4. CV:  Good peripheral perfusion.  Resp:  Normal effort.  Abd:  No distention.  Other:  Morbidly obese elderly  Caucasian female laying in bed in no acute distress.  Tenderness to palpation over anterior thighs and knees bilaterally ED Results / Procedures / Treatments  Labs (all labs ordered are listed, but only abnormal results are displayed) Labs Reviewed  BASIC METABOLIC PANEL - Abnormal; Notable for the following components:      Result Value   Potassium 3.3 (*)    Glucose, Bld 105 (*)    Creatinine, Ser 1.06 (*)    GFR, Estimated 54 (*)    All other components within normal limits  CBC - Abnormal; Notable for the following components:   WBC 12.5 (*)    RBC 5.55 (*)    Hemoglobin 16.1 (*)    HCT 50.8 (*)    All other components within normal limits  URINALYSIS, ROUTINE W REFLEX MICROSCOPIC - Abnormal; Notable for the following components:   Color, Urine YELLOW (*)    APPearance CLOUDY (*)    Nitrite POSITIVE (*)    Leukocytes,Ua LARGE (*)    WBC, UA >50 (*)    Bacteria, UA FEW (*)    All other components within normal limits  CBG MONITORING, ED   EKG ED ECG REPORT I, Naaman Plummer, the attending physician, personally viewed  and interpreted this ECG. Date: 01/14/2022 EKG Time: 0941 Rate: 65 Rhythm: normal sinus rhythm QRS Axis: normal Intervals: Bifascicular block ST/T Wave abnormalities: normal Narrative Interpretation: no evidence of acute ischemia RADIOLOGY ED MD interpretation: CT of the head without contrast interpreted by me shows no evidence of acute abnormalities including no intracerebral hemorrhage, obvious masses, or significant edema  CT of the cervical spine interpreted by me does not show any evidence of acute abnormalities including no acute fracture, malalignment, height loss, or dislocation  X-rays of bilateral knees and pelvis interpreted by me and shows no evidence of acute abnormalities -Agree with radiology assessment Official radiology report(s): DG Knee Complete 4 Views Left  Result Date: 01/14/2022 CLINICAL DATA:  Fall.  Left knee injury and pain.  EXAM: LEFT KNEE - COMPLETE 4+ VIEW COMPARISON:  04/02/2015 FINDINGS: No evidence of fracture, dislocation, or joint effusion. Moderate medial compartment osteoarthritis shows interval progression since prior study. Mild degenerative spurring of the patella is also seen. IMPRESSION: No acute findings. Moderate medial compartment osteoarthritis, with progression since prior study. Electronically Signed   By: Marlaine Hind M.D.   On: 01/14/2022 13:29   DG Pelvis Portable  Result Date: 01/14/2022 CLINICAL DATA:  Fall with lower extremity pain EXAM: PORTABLE PELVIS 1 VIEW COMPARISON:  CT abdomen and pelvis April 18, 2020 FINDINGS: There is no evidence of pelvic fracture or diastasis. No pelvic bone lesions are seen. Mild bilateral hip and pubic symphysis degenerative changes. IMPRESSION: Negative. Electronically Signed   By: Beryle Flock M.D.   On: 01/14/2022 13:28   DG Knee Complete 4 Views Right  Result Date: 01/14/2022 CLINICAL DATA:  Fall.  Right knee injury and pain. EXAM: RIGHT KNEE - COMPLETE 4+ VIEW COMPARISON:  07/07/2021 FINDINGS: No evidence of fracture, dislocation, or joint effusion. Tricompartmental osteoarthritis is again seen, with greatest involvement of the medial compartment. No other focal bone abnormality. Soft tissues are unremarkable. IMPRESSION: No acute findings. Tricompartmental osteoarthritis, with greatest involvement of the medial compartment. Electronically Signed   By: Marlaine Hind M.D.   On: 01/14/2022 13:28   CT Head Wo Contrast  Result Date: 01/14/2022 CLINICAL DATA:  Head trauma EXAM: CT HEAD WITHOUT CONTRAST TECHNIQUE: Contiguous axial images were obtained from the base of the skull through the vertex without intravenous contrast. RADIATION DOSE REDUCTION: This exam was performed according to the departmental dose-optimization program which includes automated exposure control, adjustment of the mA and/or kV according to patient size and/or use of iterative  reconstruction technique. COMPARISON:  06/05/2021 FINDINGS: Brain: There is periventricular white matter decreased attenuation consistent with small vessel ischemic changes. Ventricles, sulci and cisterns are prominent consistent with age related involutional changes. No acute intracranial hemorrhage, mass effect or shift. No hydrocephalus. Vascular: No hyperdense vessel or unexpected calcification. Skull: Normal. Negative for fracture or focal lesion. Sinuses/Orbits: No acute finding. IMPRESSION: Atrophy and chronic small vessel ischemic changes. No acute intracranial process identified. Electronically Signed   By: Sammie Bench M.D.   On: 01/14/2022 13:07   CT Cervical Spine Wo Contrast  Result Date: 01/14/2022 CLINICAL DATA:  Trauma EXAM: CT CERVICAL SPINE WITHOUT CONTRAST TECHNIQUE: Multidetector CT imaging of the cervical spine was performed without intravenous contrast. Multiplanar CT image reconstructions were also generated. RADIATION DOSE REDUCTION: This exam was performed according to the departmental dose-optimization program which includes automated exposure control, adjustment of the mA and/or kV according to patient size and/or use of iterative reconstruction technique. COMPARISON:  06/05/2021 FINDINGS: Alignment: Normal. Skull base and vertebrae:  No acute fracture. No primary bone lesion or focal pathologic process. Soft tissues and spinal canal: No prevertebral fluid or swelling. No visible canal hematoma. Disc levels:  Maintained Upper chest: Negative. IMPRESSION: Unremarkable study. Electronically Signed   By: Sammie Bench M.D.   On: 01/14/2022 13:06   PROCEDURES: Critical Care performed: No .1-3 Lead EKG Interpretation  Performed by: Naaman Plummer, MD Authorized by: Naaman Plummer, MD     Interpretation: normal     ECG rate:  58   ECG rate assessment: normal     Rhythm: sinus rhythm     Ectopy: none     Conduction: normal    MEDICATIONS ORDERED IN ED: Medications   cefTRIAXone (ROCEPHIN) 1 g in sodium chloride 0.9 % 100 mL IVPB (0 g Intravenous Stopped 01/14/22 1521)  sodium chloride 0.9 % bolus 1,000 mL (0 mLs Intravenous Stopped 01/14/22 1521)  ketorolac (TORADOL) 30 MG/ML injection 15 mg (15 mg Intravenous Given 01/14/22 1240)   IMPRESSION / MDM / ASSESSMENT AND PLAN / ED COURSE  I reviewed the triage vital signs and the nursing notes.                             The patient is on the cardiac monitor to evaluate for evidence of arrhythmia and/or significant heart rate changes. Patient's presentation is most consistent with acute presentation with potential threat to life or bodily function. Presenting after a fall that occurred just prior to arrival, resulting in injury to the bilateral lower extremities. The mechanism of injury was a mechanical ground level fall without syncope or near-syncope. The current level of pain is moderate. There was no loss of consciousness, confusion, seizure, or memory impairment. There is not a laceration associated with the injury. Denies neck pain. The patient does not take blood thinner medications. Denies vomiting, numbness/weakness, fever Patient does show signs of urinary tract infection and will be treated with cefdinir Dispo: Discharge with PCP follow-up       FINAL CLINICAL IMPRESSION(S) / ED DIAGNOSES   Final diagnoses:  Fall, initial encounter  Acute cystitis without hematuria   Rx / DC Orders   ED Discharge Orders          Ordered    cefdinir (OMNICEF) 300 MG capsule  2 times daily        01/14/22 1452           Note:  This document was prepared using Dragon voice recognition software and may include unintentional dictation errors.   Naaman Plummer, MD 01/14/22 [phone]

## 2022-01-14 NOTE — ED Triage Notes (Signed)
Pt in via EMS from home with c/o unwitnessed fall. Pt did not hit head and does not take blood thinners. Pt c/o left shoulder pain and bilateral leg pain. Pt was found on the floor by family who reports she may have been on the floor for 2 or less hours. Pt is alert to her baseline and has alzheimers. 96cbg, 56HR, 142/64

## 2022-01-17 ENCOUNTER — Encounter (INDEPENDENT_AMBULATORY_CARE_PROVIDER_SITE_OTHER): Payer: Medicare HMO

## 2022-01-17 DIAGNOSIS — U071 COVID-19: Secondary | ICD-10-CM | POA: Diagnosis not present

## 2022-01-17 MED ORDER — NIRMATRELVIR/RITONAVIR (PAXLOVID) TABLET (RENAL DOSING): 2.0000 | ORAL_TABLET | Freq: Two times a day (BID) | ORAL | 0 refills | Status: AC

## 2022-01-17 NOTE — Telephone Encounter (Signed)
Please see the MyChart message reply(ies) for my assessment and plan.  The patient gave consent for this Medical Advice Message and is aware that it may result in a bill to their insurance company as well as the possibility that this may result in a co-payment or deductible. They are an established patient, but are not seeking medical advice exclusively about a problem treated during an in person or video visit in the last 7 days. I did not recommend an in person or video visit within 7 days of my reply.  I spent a total of 7 minutes cumulative time within 7 days through MyChart messaging Chantea Surace K Loran Fleet, NP  

## 2022-01-18 ENCOUNTER — Ambulatory Visit: Payer: Medicare HMO

## 2022-01-18 ENCOUNTER — Telehealth: Payer: Self-pay

## 2022-01-18 NOTE — Progress Notes (Addendum)
Chronic Care Management Pharmacy Assistant   Name: BRICE POTTEIGER  MRN: [id number] DOB: [birth year]  Reason for Encounter: Non-CCM [facility] Follow-Up)  Medications: Outpatient Encounter Medications as of 01/18/2022  Medication Sig   cefdinir (OMNICEF) 300 MG capsule Take 1 capsule (300 mg total) by mouth 2 (two) times daily for 5 days.   donepezil (ARICEPT) 5 MG tablet Take 5 mg by mouth.   famotidine (PEPCID) 20 MG tablet Take 1 tablet (20 mg total) by mouth daily. For heartburn.   hydrOXYzine (ATARAX) 10 MG tablet TAKE 1 TO 2 TABLETS BY MOUTH AT BEDTIME AS NEEDED FOR ANXIETY OR SLEEP   lithium carbonate 150 MG capsule    memantine (NAMENDA) 5 MG tablet Take '5mg'$  once a day for a week, then increase to '5mg'$  twice daily.   nirmatrelvir/ritonavir, renal dosing, (PAXLOVID) 10 x 150 MG & 10 x '100MG'$  TABS Take 2 tablets by mouth 2 (two) times daily for 5 days.   oxybutynin (DITROPAN XL) 15 MG 24 hr tablet Take 1 tablet (15 mg total) by mouth at bedtime. For overactive bladder   simvastatin (ZOCOR) 40 MG tablet Take 1 tablet by mouth daily.   traZODone (DESYREL) 100 MG tablet    No facility-administered encounter medications on file as of 01/18/2022.   Reviewed hospital notes for details of recent visit. Has patient been contacted by Transitions of Care team? No Has patient seen PCP/specialist for hospital follow up (summarize OV if yes): No  Admitted to the ED on 01/14/2022. Discharge date was 01/14/2022.  Discharged from [facility].   Discharge diagnosis (Principal Problem): Fall Patient was discharged to Home  Brief summary of hospital course:  Lindsay Snyder is a 76 y.o. female with a stated past medical history of hypertension and Alzheimer's dementia who presents after an unwitnessed fall in her bedroom.  Patient states that she does not fully remember what happened but does endorse bilateral lower extremity pain afterwards.  Patient denies any head trauma or loss  of consciousness but once again does not recall what happened during the fall.  Patient denies any subsequent falls.  Patient's caregiver is at bedside and provides additional history that patient has been "out of it" intermittently over the past few days and the primary care physician was concerned about possible "mini strokes" and recommended a CT of the head.  Caregiver has noticed patient having "staring spells" over the past few days where patient becomes unresponsive and seems "out of it" for a few minutes before returning to baseline spontaneously.   The patient is on the cardiac monitor to evaluate for evidence of arrhythmia and/or significant heart rate changes. Patient's presentation is most consistent with acute presentation with potential threat to life or bodily function. Presenting after a fall that occurred just prior to arrival, resulting in injury to the bilateral lower extremities. The mechanism of injury was a mechanical ground level fall without syncope or near-syncope. The current level of pain is moderate. There was no loss of consciousness, confusion, seizure, or memory impairment. There is not a laceration associated with the injury. Denies neck pain. The patient does not take blood thinner medications. Denies vomiting, numbness/weakness, fever Patient does show signs of urinary tract infection and will be treated with cefdinir Dispo: Discharge with PCP follow-up  New?Medications Started at [facility] Discharge:?? -Started cefdinir (OMNICEF) 300 MG capsule    Medications that remain the same after Hospital Discharge:??  -All other medications will remain the same.  Next CCM appt: Non-CCM  Other upcoming appts: PCP appointment on 02/06/2022 for AWV  Charlene Brooke, PharmD notified and will determine if action is needed.  Marijean Niemann, [place] Pharmacy Assistant [phone]   Pharmacist addendum: Pt has follow with PCP upcoming. No PharmD intervention  warranted.  Charlene Brooke, PharmD, BCACP 01/26/22 12:08 PM

## 2022-01-18 NOTE — Telephone Encounter (Signed)
        Patient  visited [facility] on 12/17    Telephone encounter attempt :  1st  A HIPAA compliant voice message was left requesting a return call.  Instructed patient to call back     [place], [facility]  [phone] [address], [place], [place] [address] Phone: [phone] Email: [email or web address]

## 2022-01-23 ENCOUNTER — Telehealth: Payer: Self-pay

## 2022-01-23 NOTE — Telephone Encounter (Signed)
        Patient  visited [facility] on 12/17     Telephone encounter attempt :  2nd  A HIPAA compliant voice message was left requesting a return call.  Instructed patient to call back .   [place], Care Management  [phone] [address], [place], [place] [address] Phone: [phone] Email: [email or web address]

## 2022-01-24 ENCOUNTER — Other Ambulatory Visit: Payer: Self-pay | Admitting: Student

## 2022-01-24 DIAGNOSIS — R413 Other amnesia: Secondary | ICD-10-CM

## 2022-01-30 DIAGNOSIS — G309 Alzheimer's disease, unspecified: Secondary | ICD-10-CM | POA: Diagnosis not present

## 2022-01-30 DIAGNOSIS — Z9049 Acquired absence of other specified parts of digestive tract: Secondary | ICD-10-CM | POA: Diagnosis not present

## 2022-01-30 DIAGNOSIS — Z9181 History of falling: Secondary | ICD-10-CM | POA: Diagnosis not present

## 2022-01-30 DIAGNOSIS — Z792 Long term (current) use of antibiotics: Secondary | ICD-10-CM | POA: Diagnosis not present

## 2022-01-30 DIAGNOSIS — N390 Urinary tract infection, site not specified: Secondary | ICD-10-CM | POA: Diagnosis not present

## 2022-01-30 DIAGNOSIS — K219 Gastro-esophageal reflux disease without esophagitis: Secondary | ICD-10-CM | POA: Diagnosis not present

## 2022-01-30 DIAGNOSIS — F411 Generalized anxiety disorder: Secondary | ICD-10-CM | POA: Diagnosis not present

## 2022-01-30 DIAGNOSIS — F0284 Dementia in other diseases classified elsewhere, unspecified severity, with anxiety: Secondary | ICD-10-CM | POA: Diagnosis not present

## 2022-01-31 DIAGNOSIS — R5383 Other fatigue: Secondary | ICD-10-CM

## 2022-01-31 DIAGNOSIS — A0472 Enterocolitis due to Clostridium difficile, not specified as recurrent: Secondary | ICD-10-CM

## 2022-02-02 ENCOUNTER — Emergency Department (HOSPITAL_BASED_OUTPATIENT_CLINIC_OR_DEPARTMENT_OTHER): Payer: Medicare HMO

## 2022-02-02 ENCOUNTER — Emergency Department (HOSPITAL_BASED_OUTPATIENT_CLINIC_OR_DEPARTMENT_OTHER)
Admission: EM | Admit: 2022-02-02 | Discharge: 2022-02-02 | Disposition: A | Discharge status: Home / Self Care | Payer: Medicare HMO | Attending: Emergency Medicine | Admitting: Emergency Medicine

## 2022-02-02 ENCOUNTER — Other Ambulatory Visit: Payer: Self-pay

## 2022-02-02 ENCOUNTER — Encounter (HOSPITAL_BASED_OUTPATIENT_CLINIC_OR_DEPARTMENT_OTHER): Payer: Self-pay

## 2022-02-02 DIAGNOSIS — R112 Nausea with vomiting, unspecified: Secondary | ICD-10-CM | POA: Diagnosis not present

## 2022-02-02 DIAGNOSIS — Z792 Long term (current) use of antibiotics: Secondary | ICD-10-CM | POA: Diagnosis not present

## 2022-02-02 DIAGNOSIS — G309 Alzheimer's disease, unspecified: Secondary | ICD-10-CM | POA: Diagnosis not present

## 2022-02-02 DIAGNOSIS — K8020 Calculus of gallbladder without cholecystitis without obstruction: Secondary | ICD-10-CM | POA: Diagnosis not present

## 2022-02-02 DIAGNOSIS — K219 Gastro-esophageal reflux disease without esophagitis: Secondary | ICD-10-CM | POA: Diagnosis not present

## 2022-02-02 DIAGNOSIS — E860 Dehydration: Secondary | ICD-10-CM | POA: Diagnosis not present

## 2022-02-02 DIAGNOSIS — F0284 Dementia in other diseases classified elsewhere, unspecified severity, with anxiety: Secondary | ICD-10-CM | POA: Diagnosis not present

## 2022-02-02 DIAGNOSIS — F411 Generalized anxiety disorder: Secondary | ICD-10-CM | POA: Diagnosis not present

## 2022-02-02 DIAGNOSIS — R197 Diarrhea, unspecified: Secondary | ICD-10-CM | POA: Diagnosis present

## 2022-02-02 DIAGNOSIS — Z9181 History of falling: Secondary | ICD-10-CM | POA: Diagnosis not present

## 2022-02-02 DIAGNOSIS — N390 Urinary tract infection, site not specified: Secondary | ICD-10-CM | POA: Diagnosis not present

## 2022-02-02 DIAGNOSIS — Z9049 Acquired absence of other specified parts of digestive tract: Secondary | ICD-10-CM | POA: Diagnosis not present

## 2022-02-02 LAB — URINALYSIS, ROUTINE W REFLEX MICROSCOPIC
Bacteria, UA: NONE SEEN
Bilirubin Urine: NEGATIVE
Glucose, UA: NEGATIVE mg/dL
Hgb urine dipstick: NEGATIVE
Nitrite: NEGATIVE
Protein, ur: NEGATIVE mg/dL
Specific Gravity, Urine: 1.039 — ABNORMAL HIGH (ref 1.005–1.030)
pH: 6.5 (ref 5.0–8.0)

## 2022-02-02 LAB — CBC
HCT: 41.1 % (ref 36.0–46.0)
Hemoglobin: 13.7 g/dL (ref 12.0–15.0)
MCH: 29.5 pg (ref 26.0–34.0)
MCHC: 33.3 g/dL (ref 30.0–36.0)
MCV: 88.6 fL (ref 80.0–100.0)
Platelets: 231 10*3/uL (ref 150–400)
RBC: 4.64 MIL/uL (ref 3.87–5.11)
RDW: 13.2 % (ref 11.5–15.5)
WBC: 11.6 10*3/uL — ABNORMAL HIGH (ref 4.0–10.5)
nRBC: 0 % (ref 0.0–0.2)

## 2022-02-02 LAB — COMPREHENSIVE METABOLIC PANEL
ALT: 17 U/L (ref 0–44)
AST: 16 U/L (ref 15–41)
Albumin: 3.6 g/dL (ref 3.5–5.0)
Alkaline Phosphatase: 49 U/L (ref 38–126)
Anion gap: 9 (ref 5–15)
BUN: 8 mg/dL (ref 8–23)
CO2: 26 mmol/L (ref 22–32)
Calcium: 9.1 mg/dL (ref 8.9–10.3)
Chloride: 102 mmol/L (ref 98–111)
Creatinine, Ser: 1 mg/dL (ref 0.44–1.00)
GFR, Estimated: 58 mL/min — ABNORMAL LOW (ref >60)
Glucose, Bld: 106 mg/dL — ABNORMAL HIGH (ref 70–99)
Potassium: 3.4 mmol/L — ABNORMAL LOW (ref 3.5–5.1)
Sodium: 137 mmol/L (ref 135–145)
Total Bilirubin: 0.8 mg/dL (ref 0.3–1.2)
Total Protein: 6.3 g/dL — ABNORMAL LOW (ref 6.5–8.1)

## 2022-02-02 LAB — C DIFFICILE QUICK SCREEN W PCR REFLEX
C Diff antigen: POSITIVE — AB
C Diff interpretation: DETECTED
C Diff toxin: POSITIVE — AB

## 2022-02-02 LAB — MAGNESIUM: Magnesium: 2 mg/dL (ref 1.7–2.4)

## 2022-02-02 MED ORDER — ONDANSETRON 4 MG PO TBDP: 4.0000 mg | ORAL_TABLET | Freq: Three times a day (TID) | ORAL | 0 refills | Status: DC | PRN

## 2022-02-02 MED ORDER — IOHEXOL 300 MG/ML  SOLN
100.0000 mL | Freq: Once | INTRAMUSCULAR | Status: AC | PRN
  Administered 2022-02-02: 100 mL via INTRAVENOUS

## 2022-02-02 MED ORDER — ONDANSETRON HCL 4 MG/2ML IJ SOLN
4.0000 mg | Freq: Once | INTRAMUSCULAR | Status: AC
  Administered 2022-02-02: 4 mg via INTRAVENOUS
  Filled 2022-02-02: qty 2

## 2022-02-02 MED ORDER — SODIUM CHLORIDE 0.9 % IV BOLUS
1000.0000 mL | Freq: Once | INTRAVENOUS | Status: AC
  Administered 2022-02-02: 1000 mL via INTRAVENOUS

## 2022-02-02 MED ORDER — ONDANSETRON 4 MG PO TBDP: 4.0000 mg | ORAL_TABLET | Freq: Once | ORAL | Status: DC

## 2022-02-02 NOTE — ED Provider Triage Note (Signed)
Emergency Medicine Provider Triage Evaluation Note  Lindsay Snyder , a 77 y.o. female  was evaluated in triage.  Pt complains of chronic diarrhea that has been worse for the past week. Daughter feels like patient is dehydrated, reports 5+ episodes of watery diarrhea/day, loss of appetite x1 week. Was dizzy this AM when standing. Daughter also states urinary frequency.   Review of Systems  Positive: Diarrhea, nausea Negative: vomiting  Physical Exam  BP (!) 137/48 (BP Location: Right Arm)   Pulse 63   Temp 98 F (36.7 C) (Oral)   Resp 18   Ht '5\' 4"'$  (1.626 m)   Wt 115.2 kg   SpO2 98%   BMI 43.60 kg/m  Gen:   Awake, no distress   Resp:  Normal effort  MSK:   Moves extremities without difficulty  Other:  Mild abdominal ttp of bilateral upper quadrants   Medical Decision Making  Medically screening exam initiated at 3:05 PM.  Appropriate orders placed.  Lindsay Snyder was informed that the remainder of the evaluation will be completed by another provider, this initial triage assessment does not replace that evaluation, and the importance of remaining in the ED until their evaluation is complete.    Osvaldo Shipper, [place] 02/02/22 [phone]

## 2022-02-02 NOTE — ED Provider Notes (Signed)
[facility] EMERGENCY DEPT Provider Note   CSN: [id number] Arrival date & time: 02/02/22  1437     History {Add pertinent medical, surgical, social history, OB history to HPI:1} Chief Complaint  Patient presents with   Diarrhea   Dizziness   Dehydration    Lindsay Snyder is a 77 y.o. female.  She is a poor historian.  She is brought in by her daughter who is providing most of the history.  She had a fall less than a month ago was found to have a UTI and was put on antibiotics.  She received ceftriaxone and was discharged on Omnicef.  For the past 2 weeks she has had worsening diarrhea up to 5 episodes a day nonbloody.  She is nauseous and has had poor intake.  No clear fevers.  Sometimes has some abdominal cramps.  Has been getting generally weaker.  Daughter states she does have more longstanding history of diarrhea.   The history is provided by the patient and a relative.  Diarrhea Quality:  Unable to specify Severity:  Severe Onset quality:  Gradual Duration:  2 weeks Timing:  Intermittent Progression:  Unchanged Relieved by:  None tried Ineffective treatments:  None tried Associated symptoms: abdominal pain   Associated symptoms: no recent cough, no diaphoresis and no fever   Risk factors: recent antibiotic use        Home Medications Prior to Admission medications   Medication Sig Start Date End Date Taking? Authorizing Provider  donepezil (ARICEPT) 5 MG tablet Take 5 mg by mouth. 09/29/20   [provider]  famotidine (PEPCID) 20 MG tablet Take 1 tablet (20 mg total) by mouth daily. For heartburn. 07/05/20   Pleas Koch, NP  hydrOXYzine (ATARAX) 10 MG tablet TAKE 1 TO 2 TABLETS BY MOUTH AT BEDTIME AS NEEDED FOR ANXIETY OR SLEEP    [provider]  lithium carbonate 150 MG capsule  12/01/21   [provider]  memantine (NAMENDA) 5 MG tablet Take '5mg'$  once a day for a week, then increase to '5mg'$  twice daily. 02/02/21   [provider]  oxybutynin (DITROPAN XL) 15 MG 24 hr tablet Take 1 tablet (15 mg total) by mouth at bedtime. For overactive bladder 01/10/22   Pleas Koch, NP  simvastatin (ZOCOR) 40 MG tablet Take 1 tablet by mouth daily.    [provider]  traZODone (DESYREL) 100 MG tablet  12/01/21   [provider]      Allergies    Crestor [rosuvastatin calcium] and Tylenol [acetaminophen]    Review of Systems   Review of Systems  Constitutional:  Positive for activity change, appetite change and fatigue. Negative for diaphoresis and fever.  Gastrointestinal:  Positive for abdominal pain, diarrhea and nausea.    Physical Exam Updated Vital Signs BP (!) 137/48 (BP Location: Right Arm)   Pulse 63   Temp 98 F (36.7 C) (Oral)   Resp 18   Ht '5\' 4"'$  (1.626 m)   Wt 115.2 kg   SpO2 98%   BMI 43.60 kg/m  Physical Exam Vitals and nursing note reviewed.  Constitutional:      General: She is not in acute distress.    Appearance: Normal appearance. She is well-developed. She is obese.  HENT:     Head: Normocephalic and atraumatic.  Eyes:     Conjunctiva/sclera: Conjunctivae normal.  Cardiovascular:     Rate and Rhythm: Normal rate and regular rhythm.     Heart  sounds: Murmur (SEM) heard.  Pulmonary:     Effort: Pulmonary effort is normal. No respiratory distress.     Breath sounds: Normal breath sounds.  Abdominal:     Palpations: Abdomen is soft.     Tenderness: There is no abdominal tenderness. There is no guarding or rebound.  Musculoskeletal:        General: No deformity.     Cervical back: Neck supple.  Skin:    General: Skin is warm and dry.     Capillary Refill: Capillary refill takes less than 2 seconds.  Neurological:     General: No focal deficit present.     ED Results / Procedures / Treatments   Labs (all labs ordered are listed, but only abnormal results are displayed) Labs Reviewed  C DIFFICILE QUICK SCREEN W PCR REFLEX    GASTROINTESTINAL  PANEL BY PCR, STOOL (REPLACES STOOL CULTURE)  CBC  URINALYSIS, ROUTINE W REFLEX MICROSCOPIC  COMPREHENSIVE METABOLIC PANEL  MAGNESIUM    EKG EKG Interpretation  Date/Time:  Friday February 02 2022 15:10:15 EST Ventricular Rate:  65 PR Interval:  184 QRS Duration: 164 QT Interval:  480 QTC Calculation: 499 R Axis:   -46 Text Interpretation: Normal sinus rhythm Right bundle branch block Left anterior fascicular block Minimal voltage criteria for LVH, may be normal variant ( R in aVL ) Abnormal ECG When compared with ECG of 14-Jan-2022 09:45, Criteria for Septal infarct are no longer Present Confirmed by Aletta Edouard [id number]) on 02/02/2022 3:15:24 PM  Radiology No results found.  Procedures Procedures  {Document cardiac monitor, telemetry assessment procedure when appropriate:1}  Medications Ordered in ED Medications  ondansetron (ZOFRAN-ODT) disintegrating tablet 4 mg (has no administration in time range)  sodium chloride 0.9 % bolus 1,000 mL (has no administration in time range)  ondansetron (ZOFRAN) injection 4 mg (has no administration in time range)    ED Course/ Medical Decision Making/ A&P                           Medical Decision Making Amount and/or Complexity of Data Reviewed Labs: ordered.  Risk Prescription drug management.   This patient complains of ***; this involves an extensive number of treatment Options and is a complaint that carries with it a high risk of complications and morbidity. The differential includes ***  I ordered, reviewed and interpreted labs, which included *** I ordered medication *** and reviewed PMP when indicated. I ordered imaging studies which included *** and I independently    visualized and interpreted imaging which showed *** Additional history obtained from *** Previous records obtained and reviewed *** I consulted *** and discussed lab and imaging findings and discussed disposition.  Cardiac monitoring reviewed,  *** Social determinants considered, *** Critical Interventions: ***  After the interventions stated above, I reevaluated the patient and found *** Admission and further testing considered, ***   {Document critical care time when appropriate:1} {Document review of labs and clinical decision tools ie heart score, Chads2Vasc2 etc:1}  {Document your independent review of radiology images, and any outside records:1} {Document your discussion with family members, caretakers, and with consultants:1} {Document social determinants of health affecting pt's care:1} {Document your decision making why or why not admission, treatments were needed:1} Final Clinical Impression(s) / ED Diagnoses Final diagnoses:  None    Rx / DC Orders ED Discharge Orders     None

## 2022-02-02 NOTE — Discharge Instructions (Signed)
You were seen in the emergency department for nausea vomiting diarrhea.  You had lab work and a CAT scan done that did not show an obvious explanation for your symptoms.  Your C. difficile and stool studies are pending at time of discharge and this may give your doctor more information as far as how to treat this.  We are prescribing you some nausea medication.  We are also rechecking a lithium test and this is pending at time of discharge.  Please follow-up with your regular doctor as scheduled this week.  Keep well-hydrated.  Return to the emergency department if any worsening or concerning symptoms.

## 2022-02-02 NOTE — ED Triage Notes (Signed)
Daughter reports she was at the hospital 3 weeks ago after a fall and UTI.  Reports she now is having severe diarrhea and inability to keep anything down  Reports she is not thinking clearly.  Smells of feces in triage.  Family concerned for recurrent UTI dehydration.

## 2022-02-02 NOTE — ED Notes (Signed)
Pt was not able to provide urine sample bc of diarrhea.

## 2022-02-03 ENCOUNTER — Telehealth (HOSPITAL_BASED_OUTPATIENT_CLINIC_OR_DEPARTMENT_OTHER): Payer: Self-pay | Admitting: Emergency Medicine

## 2022-02-03 LAB — LITHIUM LEVEL: Lithium Lvl: 0.2 mmol/L — ABNORMAL LOW (ref 0.60–1.20)

## 2022-02-03 MED ORDER — VANCOMYCIN HCL 125 MG PO CAPS: 125.0000 mg | ORAL_CAPSULE | Freq: Four times a day (QID) | ORAL | 0 refills | Status: DC

## 2022-02-03 NOTE — Telephone Encounter (Signed)
Cdif positive. will call in script for vanco

## 2022-02-03 NOTE — ED Notes (Signed)
Dr. Melina Copa called to inform us of pt. POS. C. Diff. Test; and that he would send a prescription to treat it to their [place], [place] pharmacy. I, in turn phoned the pt. And informed her of this, for which they express their thanks.

## 2022-02-04 ENCOUNTER — Observation Stay (HOSPITAL_COMMUNITY)
Admission: EM | Admit: 2022-02-04 | Discharge: 2022-02-05 | Disposition: A | Discharge status: Home / Self Care | Payer: Medicare HMO | Attending: Internal Medicine | Admitting: Internal Medicine

## 2022-02-04 ENCOUNTER — Other Ambulatory Visit: Payer: Self-pay

## 2022-02-04 ENCOUNTER — Encounter (HOSPITAL_COMMUNITY): Payer: Self-pay | Admitting: *Deleted

## 2022-02-04 DIAGNOSIS — E860 Dehydration: Secondary | ICD-10-CM

## 2022-02-04 DIAGNOSIS — Z79899 Other long term (current) drug therapy: Secondary | ICD-10-CM | POA: Insufficient documentation

## 2022-02-04 DIAGNOSIS — Z85828 Personal history of other malignant neoplasm of skin: Secondary | ICD-10-CM | POA: Insufficient documentation

## 2022-02-04 DIAGNOSIS — A0472 Enterocolitis due to Clostridium difficile, not specified as recurrent: Principal | ICD-10-CM | POA: Diagnosis present

## 2022-02-04 DIAGNOSIS — I959 Hypotension, unspecified: Secondary | ICD-10-CM | POA: Diagnosis not present

## 2022-02-04 DIAGNOSIS — F39 Unspecified mood [affective] disorder: Secondary | ICD-10-CM | POA: Insufficient documentation

## 2022-02-04 DIAGNOSIS — N179 Acute kidney failure, unspecified: Secondary | ICD-10-CM | POA: Insufficient documentation

## 2022-02-04 DIAGNOSIS — F0390 Unspecified dementia without behavioral disturbance: Secondary | ICD-10-CM | POA: Diagnosis not present

## 2022-02-04 DIAGNOSIS — N3281 Overactive bladder: Secondary | ICD-10-CM | POA: Insufficient documentation

## 2022-02-04 HISTORY — DX: Unspecified dementia, unspecified severity, without behavioral disturbance, psychotic disturbance, mood disturbance, and anxiety: F03.90

## 2022-02-04 HISTORY — DX: Enterocolitis due to Clostridium difficile, not specified as recurrent: A04.72

## 2022-02-04 LAB — URINALYSIS, ROUTINE W REFLEX MICROSCOPIC
Bilirubin Urine: NEGATIVE
Glucose, UA: NEGATIVE mg/dL
Hgb urine dipstick: NEGATIVE
Ketones, ur: NEGATIVE mg/dL
Nitrite: NEGATIVE
Protein, ur: NEGATIVE mg/dL
Specific Gravity, Urine: 1.013 (ref 1.005–1.030)
pH: 5 (ref 5.0–8.0)

## 2022-02-04 LAB — CBC WITH DIFFERENTIAL/PLATELET
Abs Immature Granulocytes: 0.44 10*3/uL — ABNORMAL HIGH (ref 0.00–0.07)
Basophils Absolute: 0.1 10*3/uL (ref 0.0–0.1)
Basophils Relative: 1 %
Eosinophils Absolute: 0.5 10*3/uL (ref 0.0–0.5)
Eosinophils Relative: 4 %
HCT: 44.9 % (ref 36.0–46.0)
Hemoglobin: 14.1 g/dL (ref 12.0–15.0)
Immature Granulocytes: 4 %
Lymphocytes Relative: 19 %
Lymphs Abs: 2 10*3/uL (ref 0.7–4.0)
MCH: 29.4 pg (ref 26.0–34.0)
MCHC: 31.4 g/dL (ref 30.0–36.0)
MCV: 93.7 fL (ref 80.0–100.0)
Monocytes Absolute: 0.8 10*3/uL (ref 0.1–1.0)
Monocytes Relative: 8 %
Neutro Abs: 6.7 10*3/uL (ref 1.7–7.7)
Neutrophils Relative %: 64 %
Platelets: 251 10*3/uL (ref 150–400)
RBC: 4.79 MIL/uL (ref 3.87–5.11)
RDW: 13.2 % (ref 11.5–15.5)
WBC: 10.6 10*3/uL — ABNORMAL HIGH (ref 4.0–10.5)
nRBC: 0 % (ref 0.0–0.2)

## 2022-02-04 LAB — COMPREHENSIVE METABOLIC PANEL
ALT: 18 U/L (ref 0–44)
AST: 19 U/L (ref 15–41)
Albumin: 3.1 g/dL — ABNORMAL LOW (ref 3.5–5.0)
Alkaline Phosphatase: 54 U/L (ref 38–126)
Anion gap: 9 (ref 5–15)
BUN: 6 mg/dL — ABNORMAL LOW (ref 8–23)
CO2: 26 mmol/L (ref 22–32)
Calcium: 8.6 mg/dL — ABNORMAL LOW (ref 8.9–10.3)
Chloride: 103 mmol/L (ref 98–111)
Creatinine, Ser: 1.24 mg/dL — ABNORMAL HIGH (ref 0.44–1.00)
GFR, Estimated: 45 mL/min — ABNORMAL LOW (ref >60)
Glucose, Bld: 89 mg/dL (ref 70–99)
Potassium: 3.6 mmol/L (ref 3.5–5.1)
Sodium: 138 mmol/L (ref 135–145)
Total Bilirubin: 0.9 mg/dL (ref 0.3–1.2)
Total Protein: 6.4 g/dL — ABNORMAL LOW (ref 6.5–8.1)

## 2022-02-04 LAB — LACTIC ACID, PLASMA: Lactic Acid, Venous: 1.4 mmol/L (ref 0.5–1.9)

## 2022-02-04 MED ORDER — FIDAXOMICIN 200 MG PO TABS: 200.0000 mg | ORAL_TABLET | Freq: Two times a day (BID) | ORAL | 0 refills | Status: DC

## 2022-02-04 MED ORDER — VANCOMYCIN HCL 125 MG PO CAPS
125.0000 mg | ORAL_CAPSULE | Freq: Four times a day (QID) | ORAL | Status: DC
  Administered 2022-02-04 – 2022-02-05 (×2): 125 mg via ORAL
  Filled 2022-02-04 (×3): qty 1

## 2022-02-04 MED ORDER — LACTATED RINGERS IV BOLUS
1000.0000 mL | Freq: Once | INTRAVENOUS | Status: AC
  Administered 2022-02-04: 1000 mL via INTRAVENOUS

## 2022-02-04 NOTE — ED Provider Notes (Signed)
[facility] EMERGENCY DEPARTMENT Provider Note   CSN: [id number] Arrival date & time: 02/04/22  1832     History  Chief Complaint  Patient presents with   Weakness   Diarrhea    Lindsay Snyder is a 77 y.o. female.  HPI 77 year old female presents with C diff diarrhea and dehydration. History is primarily from daughter at bedside. She's been having diarrhea for over a week. She recently was treated for a UTI. She went to [facility] and was found to be C-diff positive. However the medicine she was prescribed was too expensive and needed pre-authorization so they haven't been able to give her any meds. Thus family feels she's more dehydrated. Has poor food/fluid intake. Today got weak and they thought she was going to pass out when she stood up. Some nausea.   Home Medications Prior to Admission medications   Medication Sig Start Date End Date Taking? Authorizing Provider  donepezil (ARICEPT) 5 MG tablet Take 5 mg by mouth. 09/29/20   [provider]  famotidine (PEPCID) 20 MG tablet Take 1 tablet (20 mg total) by mouth daily. For heartburn. 07/05/20   Pleas Koch, NP  fidaxomicin (DIFICID) 200 MG TABS tablet Take 1 tablet (200 mg total) by mouth 2 (two) times daily for 10 days. 02/04/22 02/14/22  Pleas Koch, NP  hydrOXYzine (ATARAX) 10 MG tablet TAKE 1 TO 2 TABLETS BY MOUTH AT BEDTIME AS NEEDED FOR ANXIETY OR SLEEP    [provider]  lithium carbonate 150 MG capsule  12/01/21   [provider]  memantine (NAMENDA) 5 MG tablet Take '5mg'$  once a day for a week, then increase to '5mg'$  twice daily. 02/02/21   [provider]  ondansetron (ZOFRAN-ODT) 4 MG disintegrating tablet Take 1 tablet (4 mg total) by mouth every 8 (eight) hours as needed for nausea or vomiting. 02/02/22   Hayden Rasmussen, MD  oxybutynin (DITROPAN XL) 15 MG 24 hr tablet Take 1 tablet (15 mg total) by mouth at bedtime. For overactive bladder 01/10/22   Pleas Koch, NP  simvastatin (ZOCOR) 40 MG tablet Take 1 tablet by mouth daily.    [provider]  traZODone (DESYREL) 100 MG tablet  12/01/21   [provider]      Allergies    Crestor [rosuvastatin calcium] and Tylenol [acetaminophen]    Review of Systems   Review of Systems  Gastrointestinal:  Positive for abdominal pain, diarrhea and nausea.  Neurological:  Positive for weakness and light-headedness. Negative for syncope.    Physical Exam Updated Vital Signs BP 125/65   Pulse 61   Temp 97.8 F (36.6 C)   Resp 14   Ht '5\' 4"'$  (1.626 m)   Wt 115.2 kg   SpO2 95%   BMI 43.60 kg/m  Physical Exam Vitals and nursing note reviewed.  Constitutional:      Appearance: She is well-developed. She is obese.  HENT:     Head: Normocephalic and atraumatic.  Cardiovascular:     Rate and Rhythm: Normal rate and regular rhythm.     Heart sounds: Normal heart sounds.  Pulmonary:     Effort: Pulmonary effort is normal.     Breath sounds: Normal breath sounds.  Abdominal:     General: There is no distension.     Palpations: Abdomen is soft.     Tenderness: There is abdominal tenderness (generalized, mild).  Skin:    General: Skin is warm and  dry.  Neurological:     Mental Status: She is alert.     ED Results / Procedures / Treatments   Labs (all labs ordered are listed, but only abnormal results are displayed) Labs Reviewed  COMPREHENSIVE METABOLIC PANEL - Abnormal; Notable for the following components:      Result Value   BUN 6 (*)    Creatinine, Ser 1.24 (*)    Calcium 8.6 (*)    Total Protein 6.4 (*)    Albumin 3.1 (*)    GFR, Estimated 45 (*)    All other components within normal limits  CBC WITH DIFFERENTIAL/PLATELET - Abnormal; Notable for the following components:   WBC 10.6 (*)    Abs Immature Granulocytes 0.44 (*)    All other components within normal limits  URINALYSIS, ROUTINE W REFLEX MICROSCOPIC - Abnormal; Notable for the following  components:   APPearance HAZY (*)    Leukocytes,Ua TRACE (*)    Bacteria, UA RARE (*)    All other components within normal limits  LACTIC ACID, PLASMA    EKG EKG Interpretation  Date/Time:  Sunday February 04 2022 22:00:34 EST Ventricular Rate:  57 PR Interval:  199 QRS Duration: 164 QT Interval:  502 QTC Calculation: 489 R Axis:   -46 Text Interpretation: Sinus rhythm Left bundle branch block Confirmed by Sherwood Gambler [phone]) on 02/04/2022 10:10:37 PM  Radiology No results found.  Procedures Procedures    Medications Ordered in ED Medications  lactated ringers bolus 1,000 mL (has no administration in time range)  vancomycin (VANCOCIN) capsule 125 mg (has no administration in time range)    ED Course/ Medical Decision Making/ A&P                           Medical Decision Making Amount and/or Complexity of Data Reviewed Labs: ordered.    Details: Creatinine a little worse than a few days ago. ECG/medicine tests: independent interpretation performed.    Details: Left bundle branch block  Risk Prescription drug management. Decision regarding hospitalization.   Patient has some mild generalized tenderness but I doubt megacolon at this time.  At this point, I think she will need IV fluids as she seems to be getting weaker and had near syncope today.  While she overall does not appear critically ill I think she will need fluids and supportive care and to be started on antibiotics.  Will start vancomycin after discussion with pharmacy.  Discussed with hospitalist, Dr. Marlowe Sax.        Final Clinical Impression(s) / ED Diagnoses Final diagnoses:  C. difficile diarrhea  Dehydration    Rx / DC Orders ED Discharge Orders     None         Sherwood Gambler, MD 02/04/22 2258

## 2022-02-04 NOTE — ED Triage Notes (Signed)
PT was tested for C-diff on Friday.  Results were called in as positive  on Sat.  However, family was not able to purchase the antibiotics b/c the insurance co was requiring prior authorization.   Pt becoming increasingly lethargic -  up with assistance only.  BM increasing.

## 2022-02-04 NOTE — ED Provider Triage Note (Signed)
Emergency Medicine Provider Triage Evaluation Note  Lindsay Snyder , a 77 y.o. female  was evaluated in triage.  Pt complains of worsening diarrhea and dehydration.  Diagnosed with C. difficile 2 days ago, problems with insurance and being able to financially afford the medications.  Awaiting prior authorization.  Denies fevers.  Review of Systems  Positive:  Negative: See above  Physical Exam  BP (!) 147/70 (BP Location: Right Arm)   Pulse (!) 58   Temp 98.6 F (37 C) (Oral)   Resp 16   Ht '5\' 4"'$  (1.626 m)   Wt 115.2 kg   SpO2 97%   BMI 43.60 kg/m  Gen:   Awake, no distress  Resp:  Normal effort  MSK:   Moves extremities without difficulty  Other:  Sitting in wheelchair, appears uncomfortable.  Appears clinically dehydrated.  Medical Decision Making  Medically screening exam initiated at 7:02 PM.  Appropriate orders placed.  Lindsay Snyder was informed that the remainder of the evaluation will be completed by another provider, this initial triage assessment does not replace that evaluation, and the importance of remaining in the ED until their evaluation is complete.     Prince Rome, [place] 24/46/95 1905

## 2022-02-05 ENCOUNTER — Telehealth (HOSPITAL_COMMUNITY): Payer: Self-pay | Admitting: Pharmacy Technician

## 2022-02-05 ENCOUNTER — Other Ambulatory Visit (HOSPITAL_COMMUNITY): Payer: Self-pay

## 2022-02-05 ENCOUNTER — Telehealth: Payer: Self-pay

## 2022-02-05 DIAGNOSIS — F39 Unspecified mood [affective] disorder: Secondary | ICD-10-CM | POA: Insufficient documentation

## 2022-02-05 DIAGNOSIS — A0472 Enterocolitis due to Clostridium difficile, not specified as recurrent: Secondary | ICD-10-CM | POA: Diagnosis not present

## 2022-02-05 DIAGNOSIS — F0390 Unspecified dementia without behavioral disturbance: Secondary | ICD-10-CM | POA: Insufficient documentation

## 2022-02-05 DIAGNOSIS — N179 Acute kidney failure, unspecified: Secondary | ICD-10-CM | POA: Insufficient documentation

## 2022-02-05 DIAGNOSIS — N3281 Overactive bladder: Secondary | ICD-10-CM | POA: Insufficient documentation

## 2022-02-05 LAB — CBC
HCT: 42.2 % (ref 36.0–46.0)
Hemoglobin: 13.4 g/dL (ref 12.0–15.0)
MCH: 29.1 pg (ref 26.0–34.0)
MCHC: 31.8 g/dL (ref 30.0–36.0)
MCV: 91.7 fL (ref 80.0–100.0)
Platelets: 225 10*3/uL (ref 150–400)
RBC: 4.6 MIL/uL (ref 3.87–5.11)
RDW: 13.1 % (ref 11.5–15.5)
WBC: 8.7 10*3/uL (ref 4.0–10.5)
nRBC: 0 % (ref 0.0–0.2)

## 2022-02-05 LAB — BASIC METABOLIC PANEL
Anion gap: 5 (ref 5–15)
BUN: 5 mg/dL — ABNORMAL LOW (ref 8–23)
CO2: 28 mmol/L (ref 22–32)
Calcium: 8.1 mg/dL — ABNORMAL LOW (ref 8.9–10.3)
Chloride: 106 mmol/L (ref 98–111)
Creatinine, Ser: 0.97 mg/dL (ref 0.44–1.00)
GFR, Estimated: >60 mL/min (ref >60)
Glucose, Bld: 99 mg/dL (ref 70–99)
Potassium: 3.6 mmol/L (ref 3.5–5.1)
Sodium: 139 mmol/L (ref 135–145)

## 2022-02-05 LAB — TROPONIN I (HIGH SENSITIVITY): Troponin I (High Sensitivity): 10 ng/L (ref <18)

## 2022-02-05 MED ORDER — OXYBUTYNIN CHLORIDE ER 5 MG PO TB24
15.0000 mg | ORAL_TABLET | Freq: Every day | ORAL | Status: DC
  Administered 2022-02-05: 15 mg via ORAL
  Filled 2022-02-05 (×2): qty 1

## 2022-02-05 MED ORDER — VANCOMYCIN HCL 125 MG PO CAPS
125.0000 mg | ORAL_CAPSULE | Freq: Four times a day (QID) | ORAL | 0 refills | Status: AC
  Filled 2022-02-05: qty 40, 10d supply, fill #0

## 2022-02-05 MED ORDER — DONEPEZIL HCL 10 MG PO TABS
5.0000 mg | ORAL_TABLET | Freq: Every day | ORAL | Status: DC
  Administered 2022-02-05: 5 mg via ORAL
  Filled 2022-02-05: qty 1

## 2022-02-05 MED ORDER — MIRTAZAPINE 15 MG PO TABS
15.0000 mg | ORAL_TABLET | Freq: Every day | ORAL | Status: DC
  Administered 2022-02-05: 15 mg via ORAL
  Filled 2022-02-05: qty 1

## 2022-02-05 MED ORDER — LITHIUM CARBONATE 150 MG PO CAPS
150.0000 mg | ORAL_CAPSULE | Freq: Every day | ORAL | Status: DC
  Filled 2022-02-05: qty 1

## 2022-02-05 MED ORDER — SODIUM CHLORIDE 0.9 % IV SOLN: INTRAVENOUS | Status: DC

## 2022-02-05 MED ORDER — TRAZODONE HCL 50 MG PO TABS
100.0000 mg | ORAL_TABLET | Freq: Every day | ORAL | Status: DC
  Administered 2022-02-05: 100 mg via ORAL
  Filled 2022-02-05: qty 2

## 2022-02-05 MED ORDER — MEMANTINE HCL 10 MG PO TABS
10.0000 mg | ORAL_TABLET | Freq: Every day | ORAL | Status: DC
  Administered 2022-02-05: 10 mg via ORAL
  Filled 2022-02-05: qty 1

## 2022-02-05 MED ORDER — ENOXAPARIN SODIUM 40 MG/0.4ML IJ SOSY: 40.0000 mg | PREFILLED_SYRINGE | INTRAMUSCULAR | Status: DC

## 2022-02-05 NOTE — Hospital Course (Addendum)
Taken from H&P.  Lindsay Snyder is a 77 y.o. female with medical history significant of dementia, osteoarthritis, insomnia, anxiety, urinary incontinence, GERD.  She was recently seen at [facility] ED on 1/5 for diarrhea in the setting of recent antibiotic use for UTI.  Tested positive for C. difficile and prescription for oral vancomycin was sent to her pharmacy.  CT done at that time did not show evidence of colitis.  Patient was not able to purchase the antibiotic due to cost/insurance preauthorization issues and returns to the ED due to ongoing diarrhea, dehydration, near syncope, and generalized weakness.   ED course.  Afebrile. Labs significant for WBC 10.6, hemoglobin 14.1, creatinine 1.2 (baseline 0.8-1.0), lactic acid normal. UA negative nitrite, trace leukocytes, and microscopy showing 6-10 WBCs and rare bacteria. Patient was started on oral vancomycin and was given 1 L LR bolus.   1/8: Mildly elevated blood pressure in the morning at 169/85.  Creatinine normalized with IV fluid, at 0.97 now.  Leukocytosis resolved with WBC count of 8.7.  Diarrhea improved.  Patient is tolerating diet well.  No abdominal pain.  She received a prescription of fidaxomicin yesterday which is very expensive and her insurance was not covering it.  Discussed with pharmacy and she is being given vancomycin capsules 4 times daily for 10 days.  Also discussed with her daughter on phone and they were able to afford that medicine at her Medicare cost.  Preauthorization was also obtained.  Patient is being discharged on p.o. vancomycin for 10 days for C. difficile colitis.  She will continue taking rest of her home medications and follow-up with her providers for further recommendations.

## 2022-02-05 NOTE — ED Notes (Signed)
Patients daughter would like an update if anything changes.

## 2022-02-05 NOTE — Telephone Encounter (Signed)
Patient Advocate Encounter  Prior Authorization for vancomycin 125 mg capsules has been approved.    PA# [id number] Key: [id number] Effective dates: 02/05/2022 through 01/29/2023  Patients co-pay is $72.30.     Lyndel Safe, [place] Patient Advocate Specialist [place] Patient Advocate Team Direct Number: [phone]  Fax: [phone]

## 2022-02-05 NOTE — H&P (Signed)
History and Physical    Lindsay Snyder TDD:[id number] DOB: December 04, 1945 DOA: 02/04/2022  PCP: Lindsay Koch, NP  Patient coming from: Home  Chief Complaint: Diarrhea  HPI: Lindsay Snyder is a 77 y.o. female with medical history significant of dementia, osteoarthritis, insomnia, anxiety, urinary incontinence, GERD.  She was recently seen at [facility] ED on 1/5 for diarrhea in the setting of recent antibiotic use for UTI.  Tested positive for C. difficile and prescription for oral vancomycin was sent to her pharmacy.  CT done at that time did not show evidence of colitis.  Patient was not able to purchase the antibiotic due to cost/insurance preauthorization issues and returns to the ED due to ongoing diarrhea, dehydration, near syncope, and generalized weakness.  Afebrile.  Labs significant for WBC 10.6, hemoglobin 14.1, creatinine 1.2 (baseline 0.8-1.0), lactic acid normal.  UA negative nitrite, trace leukocytes, and microscopy showing 6-10 WBCs and rare bacteria. Patient was started on oral vancomycin and was given 1 L LR bolus.  [facility] called to admit.  Patient states she was recently treated for a UTI with an antibiotic and started having diarrhea a week ago.  Reports nonbloody watery diarrhea, 3-4 episodes each day.  She has not been eating much and not drinking enough fluids.  She was feeling lightheaded at home but denies any episodes of syncope or near syncope.  Not feeling dizzy or lightheaded at this time.  She has had some mild intermittent abdominal cramping/discomfort since the onset of her diarrhea but denies any abdominal pain at present.  Denies fevers.  No other complaints.  Denies shortness of breath or chest pain.  Review of Systems:  Review of Systems  All other systems reviewed and are negative.   Past Medical History:  Diagnosis Date   Allergy 010150   Anxiety 08/30/2019   Appendicitis 8    Cancer ([place])    Dementia (HCC)    GERD (gastroesophageal reflux disease)  01/30/2019   Polyp of colon    Benign    Past Surgical History:  Procedure Laterality Date   ABDOMINAL HYSTERECTOMY     APPENDECTOMY     BREAST EXCISIONAL BIOPSY Right    + 20 years neg   COLONOSCOPY WITH PROPOFOL N/A 12/16/2017   Procedure: COLONOSCOPY WITH PROPOFOL;  Surgeon: Jonathon Bellows, MD;  Location: [facility] ENDOSCOPY;  Service: Gastroenterology;  Laterality: N/A;   COLONOSCOPY WITH PROPOFOL N/A 01/02/2021   Procedure: COLONOSCOPY WITH PROPOFOL;  Surgeon: Jonathon Bellows, MD;  Location: [facility] ENDOSCOPY;  Service: Gastroenterology;  Laterality: N/A;     reports that she has never smoked. She has never used smokeless tobacco. She reports that she does not drink alcohol and does not use drugs.  Allergies  Allergen Reactions   Crestor [Rosuvastatin Calcium] Rash and Other (See Comments)    Myalgia   Tylenol [Acetaminophen] Rash    Family History  Problem Relation Age of Onset   Diabetes Mother        Deceased   Heart attack Father 5       Deceased   Cancer Father        Colon   Breast cancer Paternal Aunt     Prior to Admission medications   Medication Sig Start Date End Date Taking? Authorizing Provider  cetirizine (ZYRTEC) 10 MG tablet Take 10 mg by mouth daily.   Yes [provider]  donepezil (ARICEPT) 5 MG tablet Take 5 mg by mouth at bedtime. 09/29/20  Yes [provider]  lithium  carbonate 150 MG capsule Take 150 mg by mouth daily. 12/01/21  Yes [provider]  memantine (NAMENDA) 5 MG tablet Take 10 mg by mouth daily. 02/02/21  Yes [provider]  mirtazapine (REMERON) 15 MG tablet Take 15 mg by mouth at bedtime.   Yes [provider]  ondansetron (ZOFRAN-ODT) 4 MG disintegrating tablet Take 1 tablet (4 mg total) by mouth every 8 (eight) hours as needed for nausea or vomiting. 02/02/22  Yes Hayden Rasmussen, MD  oxybutynin (DITROPAN XL) 15 MG 24 hr tablet Take 1 tablet (15 mg total) by mouth at bedtime. For overactive bladder  01/10/22  Yes Lindsay Koch, NP  traZODone (DESYREL) 100 MG tablet Take 100 mg by mouth at bedtime. 12/01/21  Yes [provider]  fidaxomicin (DIFICID) 200 MG TABS tablet Take 1 tablet (200 mg total) by mouth 2 (two) times daily for 10 days. Patient not taking: Reported on 02/04/2022 02/04/22 02/14/22  Lindsay Koch, NP    Physical Exam: Vitals:   02/04/22 2300 02/04/22 2330 02/05/22 0000 02/05/22 0030  BP: 135/61 132/63 (!) 125/59 131/69  Pulse: (!) 52 (!) 56 (!) 50 (!) 54  Resp:      Temp:      TempSrc:      SpO2: 97% 98% 95% 100%  Weight:      Height:        Physical Exam Vitals reviewed.  Constitutional:      General: She is not in acute distress. HENT:     Head: Normocephalic and atraumatic.     Mouth/Throat:     Mouth: Mucous membranes are dry.  Eyes:     Extraocular Movements: Extraocular movements intact.  Cardiovascular:     Rate and Rhythm: Normal rate and regular rhythm.     Pulses: Normal pulses.  Pulmonary:     Effort: Pulmonary effort is normal. No respiratory distress.     Breath sounds: Normal breath sounds. No wheezing or rales.  Abdominal:     General: There is no distension.     Palpations: Abdomen is soft.     Tenderness: There is no abdominal tenderness. There is no guarding or rebound.     Comments: Hyperactive bowel sounds  Musculoskeletal:     Cervical back: Normal range of motion.     Right lower leg: No edema.     Left lower leg: No edema.  Skin:    General: Skin is warm and dry.  Neurological:     General: No focal deficit present.     Mental Status: She is alert and oriented to person, place, and time.     Labs on Admission: I have personally reviewed following labs and imaging studies  CBC: Recent Labs  Lab 02/02/22 1650 02/04/22 1938  WBC 11.6* 10.6*  NEUTROABS  --  6.7  HGB 13.7 14.1  HCT 41.1 44.9  MCV 88.6 93.7  PLT 231 627   Basic Metabolic Panel: Recent Labs  Lab 02/02/22 1650 02/04/22 1938  NA 137  138  K 3.4* 3.6  CL 102 103  CO2 26 26  GLUCOSE 106* 89  BUN 8 6*  CREATININE 1.00 1.24*  CALCIUM 9.1 8.6*  MG 2.0  --    GFR: Estimated Creatinine Clearance: 47.3 mL/min (A) (by C-G formula based on SCr of 1.24 mg/dL (H)). Liver Function Tests: Recent Labs  Lab 02/02/22 1650 02/04/22 1938  AST 16 19  ALT 17 18  ALKPHOS 49 54  BILITOT  0.8 0.9  PROT 6.3* 6.4*  ALBUMIN 3.6 3.1*   No results for input(s): "LIPASE", "AMYLASE" in the last 168 hours. No results for input(s): "AMMONIA" in the last 168 hours. Coagulation Profile: No results for input(s): "INR", "PROTIME" in the last 168 hours. Cardiac Enzymes: No results for input(s): "CKTOTAL", "CKMB", "CKMBINDEX", "TROPONINI" in the last 168 hours. BNP (last 3 results) No results for input(s): "PROBNP" in the last 8760 hours. HbA1C: No results for input(s): "HGBA1C" in the last 72 hours. CBG: No results for input(s): "GLUCAP" in the last 168 hours. Lipid Profile: No results for input(s): "CHOL", "HDL", "LDLCALC", "TRIG", "CHOLHDL", "LDLDIRECT" in the last 72 hours. Thyroid Function Tests: No results for input(s): "TSH", "T4TOTAL", "FREET4", "T3FREE", "THYROIDAB" in the last 72 hours. Anemia Panel: No results for input(s): "VITAMINB12", "FOLATE", "FERRITIN", "TIBC", "IRON", "RETICCTPCT" in the last 72 hours. Urine analysis:    Component Value Date/Time   COLORURINE YELLOW 02/04/2022 1915   APPEARANCEUR HAZY (A) 02/04/2022 1915   LABSPEC 1.013 02/04/2022 1915   PHURINE 5.0 02/04/2022 1915   GLUCOSEU NEGATIVE 02/04/2022 1915   GLUCOSEU NEGATIVE 02/07/2021 1234   HGBUR NEGATIVE 02/04/2022 1915   [place] NEGATIVE 02/04/2022 1915   BILIRUBINUR neg 10/14/2020 1124   [place] 02/04/2022 1915   PROTEINUR NEGATIVE 02/04/2022 1915   UROBILINOGEN 0.2 02/07/2021 1234   NITRITE NEGATIVE 02/04/2022 1915   LEUKOCYTESUR TRACE (A) 02/04/2022 1915    Radiological Exams on Admission: No results found.  EKG:  Independently reviewed.  Sinus rhythm, LAFB. LBBB appears new compared to prior tracings.  Assessment and Plan  C. difficile diarrhea In the setting of recent antibiotic use for UTI.  No fever or signs of sepsis.  WBC count 10.6, lactate normal.  Recent CT done 1/5 without evidence of colitis.  At present, no signs of severe disease/fulminant colitis. -Continue oral vancomycin -Enteric precautions -Monitor WBC count and electrolytes  Dehydration/mild AKI In the setting of diarrhea and poor p.o. intake.  Creatinine 1.2, baseline 0.8-1.0. -Continue IV fluid hydration -Monitor renal function and avoid nephrotoxic agents  Dementia/mood disorder -Continue donezepil, memantine, lithium, mirtazapine, trazodone -Delirium and fall precautions  Overactive bladder -Continue oxybutynin  Abnormal EKG finding EKG done today showing LBBB which appears to be new.  ACS less likely as patient is not endorsing chest pain and appears comfortable. -Cardiac monitoring -Stat troponin x 2  DVT prophylaxis: Lovenox Code Status: Full Code (discussed with the patient) Family Communication: No family available at this time. Level of care: Telemetry bed Admission status: It is my clinical opinion that referral for OBSERVATION is reasonable and necessary in this patient based on the above information provided. The aforementioned taken together are felt to place the patient at high risk for further clinical deterioration. However, it is anticipated that the patient may be medically stable for discharge from the hospital within 24 to 48 hours.   Shela Leff MD [facility]  If 7PM-7AM, please contact night-coverage [email or web address]  02/05/2022, 12:50 AM

## 2022-02-05 NOTE — Telephone Encounter (Signed)
Per chart review tab pt was admitted to [facility] on 02/04/22. Sending note to [name].   [place] Night - Client TELEPHONE ADVICE RECORD [facility]urse Patient Name: Lindsay Snyder Gender: Female DOB: Mar 23, 1945 Age: 77 Y 61 M 28 D Return Phone Number: [phone] (Primary) Address: City/ State/ Zip: [place]  [address] Client [facility] Night - Client Client Site [place] - Night Provider Alma Friendly - NP Contact Type Call Who Is Calling Patient / Member / Family / Caregiver Call Type Triage / Clinical Caller Name Jahleah Mariscal Relationship To Patient Daughter Return Phone Number [phone] (Primary) Chief Complaint Urinary Decreased Output (greater than THREE MONTHS old and no output in 8 hours) Reason for Call Symptomatic / Request for Health Information Initial Comment Caller states mother is dehydrated, tested positive for CDIF while at the ED on 02/02/2022 not eating, not urinating, not drinking. Pharmacy needs preauthorization to fill medication. Additional Comment pale Translation No Nurse Assessment Nurse: Della Goo, RN, Vicente Males Date/Time (Eastern Time): 02/04/2022 4:33:28 PM Confirm and document reason for call. If symptomatic, describe symptoms. ---Caller states mother was at the ED two days ago, diagnosed with cdif. abx was called in, but it is $933. She is very pale, not eating, not urinating, dizzy, lightheaded, lethargic. Does the patient have any new or worsening symptoms? ---Yes Will a triage be completed? ---Yes Related visit to physician within the last 2 weeks? ---Yes Does the PT have any chronic conditions? (i.e. diabetes, asthma, this includes High risk factors for pregnancy, etc.) ---Yes List chronic conditions. ---alzheimers Is this a behavioral health or substance abuse call? ---No Guidelines Guideline Title Affirmed Question Affirmed Notes Nurse Date/Time  (Eastern Time) Post-Hospitalization Follow-up Call Patient sounds very sick or weak to the triager Della Goo, RN, Vicente Males 02/04/2022 4:35:42 PM Dizziness - Lightheadedness Difficult to awaken or acting confused (e.g., Quandt, RN, Vicente Males 02/04/2022 4:36:51 PM PLEASE NOTE: All timestamps contained within this report are represented as [place] Standard Time. CONFIDENTIALTY NOTICE: This fax transmission is intended only for the addressee. It contains information that is legally privileged, confidential or otherwise protected from use or disclosure. If you are not the intended recipient, you are strictly prohibited from reviewing, disclosing, copying using or disseminating any of this information or taking any action in reliance on or regarding this information. If you have received this fax in error, please notify us immediately by telephone so that we can arrange for its return to [place]. Phone: [phone], Toll-Free: [phone], Fax: [phone] Page: 2 of 2 Call Id: [id number] Guidelines Guideline Title Affirmed Question Affirmed Notes Nurse Date/Time Eilene Ghazi Time) disoriented, slurred speech) Disp. Time Eilene Ghazi Time) Disposition Final User 02/04/2022 4:36:18 PM Go to ED Now (or PCP triage) Della Goo, [place], Vicente Males 02/04/2022 4:38:22 PM Call EMS 911 Now Yes Quandt, RN, Vicente Males 02/04/2022 4:44:45 PM 911 Outcome Documentation Quandt, RN, Vicente Males Reason: has not called ems yet. states she will Final Disposition 02/04/2022 4:38:22 PM Call EMS 911 Now Yes Quandt, RN, Allegra Lai Disagree/Comply Comply Caller Understands Yes PreDisposition InappropriateToAsk Care Advice Given Per Guideline GO TO ED NOW (OR PCP TRIAGE): CARE ADVICE per Post-Hospitalization Follow-Up Call (Adult) guideline. CALL EMS 911 NOW: * Triager Discretion: I'll call you back in a few minutes to be sure you were able to reach them. CARE ADVICE given per Dizziness (Adult) guideline. Referrals GO TO FACILITY UNDECIDE

## 2022-02-05 NOTE — Discharge Summary (Signed)
Physician Discharge Summary   Patient: Lindsay Snyder MRN: [id number] DOB: 02/10/1945  Admit date:     02/04/2022  Discharge date: 02/05/22  Discharge Physician: Lorella Nimrod   PCP: Pleas Koch, NP   Recommendations at discharge:  Please obtain CBC and BMP in 1 week Please ensure the completion of 10-day course of p.o. vancomycin for C. difficile colitis Follow-up with primary care provider within a week  Discharge Diagnoses: Principal Problem:   C. difficile diarrhea Active Problems:   AKI (acute kidney injury) ([place])   Dementia ([place])   Mood disorder ([place])   Overactive bladder   Hospital Course: Taken from H&P.  Lindsay Snyder is a 77 y.o. female with medical history significant of dementia, osteoarthritis, insomnia, anxiety, urinary incontinence, GERD.  She was recently seen at [facility] ED on 1/5 for diarrhea in the setting of recent antibiotic use for UTI.  Tested positive for C. difficile and prescription for oral vancomycin was sent to her pharmacy.  CT done at that time did not show evidence of colitis.  Patient was not able to purchase the antibiotic due to cost/insurance preauthorization issues and returns to the ED due to ongoing diarrhea, dehydration, near syncope, and generalized weakness.   ED course.  Afebrile. Labs significant for WBC 10.6, hemoglobin 14.1, creatinine 1.2 (baseline 0.8-1.0), lactic acid normal. UA negative nitrite, trace leukocytes, and microscopy showing 6-10 WBCs and rare bacteria. Patient was started on oral vancomycin and was given 1 L LR bolus.   1/8: Mildly elevated blood pressure in the morning at 169/85.  Creatinine normalized with IV fluid, at 0.97 now.  Leukocytosis resolved with WBC count of 8.7.  Diarrhea improved.  Patient is tolerating diet well.  No abdominal pain.  She received a prescription of fidaxomicin yesterday which is very expensive and her insurance was not covering it.  Discussed with pharmacy and she is being given  vancomycin capsules 4 times daily for 10 days.  Also discussed with her daughter on phone and they were able to afford that medicine at her Medicare cost.  Preauthorization was also obtained.  Patient is being discharged on p.o. vancomycin for 10 days for C. difficile colitis.  She will continue taking rest of her home medications and follow-up with her providers for further recommendations.   Consultants: None Procedures performed: None Disposition: Home Diet recommendation:  Discharge Diet Orders (From admission, onward)     Start     Ordered   02/05/22 0000  Diet - low sodium heart healthy        02/05/22 0943           Cardiac diet DISCHARGE MEDICATION: Allergies as of 02/05/2022       Reactions   Crestor [rosuvastatin Calcium] Rash, Other (See Comments)   Myalgia   Tylenol [acetaminophen] Rash        Medication List     TAKE these medications    cetirizine 10 MG tablet Commonly known as: ZYRTEC Take 10 mg by mouth daily.   donepezil 5 MG tablet Commonly known as: ARICEPT Take 5 mg by mouth at bedtime.   lithium carbonate 150 MG capsule Take 150 mg by mouth daily.   memantine 5 MG tablet Commonly known as: NAMENDA Take 10 mg by mouth daily.   mirtazapine 15 MG tablet Commonly known as: REMERON Take 15 mg by mouth at bedtime.   ondansetron 4 MG disintegrating tablet Commonly known as: ZOFRAN-ODT Take 1 tablet (4 mg total) by mouth every  8 (eight) hours as needed for nausea or vomiting.   oxybutynin 15 MG 24 hr tablet Commonly known as: DITROPAN XL Take 1 tablet (15 mg total) by mouth at bedtime. For overactive bladder   traZODone 100 MG tablet Commonly known as: DESYREL Take 100 mg by mouth at bedtime.   vancomycin 125 MG capsule Commonly known as: VANCOCIN Take 1 capsule (125 mg total) by mouth 4 (four) times daily for 10 days.        Follow-up Information     Pleas Koch, NP. Schedule an appointment as soon as possible for a visit  in 1 week(s).   Specialty: Internal Medicine Contact information: [place] [address] [phone]                Discharge Exam: Danley Danker Weights   02/04/22 1855  Weight: 115.2 kg   General.     In no acute distress. Pulmonary.  Lungs clear bilaterally, normal respiratory effort. CV.  Regular rate and rhythm, no JVD, rub or murmur. Abdomen.  Soft, nontender, nondistended, BS positive. CNS.  Alert and oriented .  No focal neurologic deficit. Extremities.  No edema, no cyanosis, pulses intact and symmetrical. Psychiatry.  Judgment and insight appears normal.   Condition at discharge: stable  The results of significant diagnostics from this hospitalization (including imaging, microbiology, ancillary and laboratory) are listed below for reference.   Imaging Studies: CT ABDOMEN PELVIS W CONTRAST  Result Date: 02/02/2022 CLINICAL DATA:  Diarrhea EXAM: CT ABDOMEN AND PELVIS WITH CONTRAST TECHNIQUE: Multidetector CT imaging of the abdomen and pelvis was performed using the standard protocol following bolus administration of intravenous contrast. RADIATION DOSE REDUCTION: This exam was performed according to the departmental dose-optimization program which includes automated exposure control, adjustment of the mA and/or kV according to patient size and/or use of iterative reconstruction technique. CONTRAST:  124m OMNIPAQUE IOHEXOL 300 MG/ML  SOLN COMPARISON:  04/18/2020 FINDINGS: Lower chest: No pleural or pericardial effusion. Hepatobiliary: Partially calcified gallstones up to 7 mm in the nondistended gallbladder. No focal liver lesion or biliary ductal dilatation. Pancreas: Unremarkable. No pancreatic ductal dilatation or surrounding inflammatory changes. Spleen: Normal in size. Adrenals/Urinary Tract: 1.5 cm right and 1.1 cm left adrenal nodules as before probably adenomas. No evident urolithiasis. No hydronephrosis. Symmetric renal enhancement. Urinary bladder  physiologically distended. Stomach/Bowel: Small hiatal hernia. The stomach is nondistended. Small bowel decompressed. Post appendectomy. Colon is partially distended by gas and fecal material, unremarkable. Vascular/Lymphatic: Minimal calcified aortic plaque without aneurysm or stenosis. Portal vein patent. No abdominal or pelvic adenopathy. Reproductive: Status post hysterectomy. No adnexal masses. Other: No ascites.  No free air. Musculoskeletal: Spondylitic changes in the lower lumbar spine. Bilateral L5 pars defect with degenerative disc disease and stable anterolisthesis L5-S1. IMPRESSION: 1. No acute findings. 2. Cholelithiasis. 3. Small hiatal hernia. Electronically Signed   By: DLucrezia EuropeM.D.   On: 02/02/2022 19:31   DG Knee Complete 4 Views Left  Result Date: 01/14/2022 CLINICAL DATA:  Fall.  Left knee injury and pain. EXAM: LEFT KNEE - COMPLETE 4+ VIEW COMPARISON:  04/02/2015 FINDINGS: No evidence of fracture, dislocation, or joint effusion. Moderate medial compartment osteoarthritis shows interval progression since prior study. Mild degenerative spurring of the patella is also seen. IMPRESSION: No acute findings. Moderate medial compartment osteoarthritis, with progression since prior study. Electronically Signed   By: JMarlaine HindM.D.   On: 01/14/2022 13:29   DG Pelvis Portable  Result Date: 01/14/2022 CLINICAL DATA:  Fall with lower extremity pain EXAM: PORTABLE PELVIS 1 VIEW COMPARISON:  CT abdomen and pelvis April 18, 2020 FINDINGS: There is no evidence of pelvic fracture or diastasis. No pelvic bone lesions are seen. Mild bilateral hip and pubic symphysis degenerative changes. IMPRESSION: Negative. Electronically Signed   By: Beryle Flock M.D.   On: 01/14/2022 13:28   DG Knee Complete 4 Views Right  Result Date: 01/14/2022 CLINICAL DATA:  Fall.  Right knee injury and pain. EXAM: RIGHT KNEE - COMPLETE 4+ VIEW COMPARISON:  07/07/2021 FINDINGS: No evidence of fracture, dislocation,  or joint effusion. Tricompartmental osteoarthritis is again seen, with greatest involvement of the medial compartment. No other focal bone abnormality. Soft tissues are unremarkable. IMPRESSION: No acute findings. Tricompartmental osteoarthritis, with greatest involvement of the medial compartment. Electronically Signed   By: Marlaine Hind M.D.   On: 01/14/2022 13:28   CT Head Wo Contrast  Result Date: 01/14/2022 CLINICAL DATA:  Head trauma EXAM: CT HEAD WITHOUT CONTRAST TECHNIQUE: Contiguous axial images were obtained from the base of the skull through the vertex without intravenous contrast. RADIATION DOSE REDUCTION: This exam was performed according to the departmental dose-optimization program which includes automated exposure control, adjustment of the mA and/or kV according to patient size and/or use of iterative reconstruction technique. COMPARISON:  06/05/2021 FINDINGS: Brain: There is periventricular white matter decreased attenuation consistent with small vessel ischemic changes. Ventricles, sulci and cisterns are prominent consistent with age related involutional changes. No acute intracranial hemorrhage, mass effect or shift. No hydrocephalus. Vascular: No hyperdense vessel or unexpected calcification. Skull: Normal. Negative for fracture or focal lesion. Sinuses/Orbits: No acute finding. IMPRESSION: Atrophy and chronic small vessel ischemic changes. No acute intracranial process identified. Electronically Signed   By: Sammie Bench M.D.   On: 01/14/2022 13:07   CT Cervical Spine Wo Contrast  Result Date: 01/14/2022 CLINICAL DATA:  Trauma EXAM: CT CERVICAL SPINE WITHOUT CONTRAST TECHNIQUE: Multidetector CT imaging of the cervical spine was performed without intravenous contrast. Multiplanar CT image reconstructions were also generated. RADIATION DOSE REDUCTION: This exam was performed according to the departmental dose-optimization program which includes automated exposure control, adjustment  of the mA and/or kV according to patient size and/or use of iterative reconstruction technique. COMPARISON:  06/05/2021 FINDINGS: Alignment: Normal. Skull base and vertebrae: No acute fracture. No primary bone lesion or focal pathologic process. Soft tissues and spinal canal: No prevertebral fluid or swelling. No visible canal hematoma. Disc levels:  Maintained Upper chest: Negative. IMPRESSION: Unremarkable study. Electronically Signed   By: Sammie Bench M.D.   On: 01/14/2022 13:06    Microbiology: Results for orders placed or performed during the hospital encounter of 02/02/22  C Difficile Quick Screen w PCR reflex     Status: Abnormal   Collection Time: 02/02/22  6:00 PM   Specimen: STOOL  Result Value Ref Range Status   C Diff antigen POSITIVE (A) NEGATIVE Final   C Diff toxin POSITIVE (A) NEGATIVE Final   C Diff interpretation Toxin producing C. difficile detected.  Final    Comment: CRITICAL RESULT CALLED TO, READ BACK BY AND VERIFIED WITH: A POWELL,RN'@2238'$  02/02/22 [place] [employer] Performed at [facility], [place] [address] [place], [place] [address]     Labs: CBC: Recent Labs  Lab 02/02/22 1650 02/04/22 1938 02/05/22 0357  WBC 11.6* 10.6* 8.7  NEUTROABS  --  6.7  --   HGB 13.7 14.1 13.4  HCT 41.1 44.9 42.2  MCV 88.6 93.7 91.7  PLT 231 251  794   Basic Metabolic Panel: Recent Labs  Lab 02/02/22 1650 02/04/22 1938 02/05/22 0357  NA 137 138 139  K 3.4* 3.6 3.6  CL 102 103 106  CO2 '26 26 28  '$ GLUCOSE 106* 89 99  BUN 8 6* 5*  CREATININE 1.00 1.24* 0.97  CALCIUM 9.1 8.6* 8.1*  MG 2.0  --   --    Liver Function Tests: Recent Labs  Lab 02/02/22 1650 02/04/22 1938  AST 16 19  ALT 17 18  ALKPHOS 49 54  BILITOT 0.8 0.9  PROT 6.3* 6.4*  ALBUMIN 3.6 3.1*   CBG: No results for input(s): "GLUCAP" in the last 168 hours.  Discharge time spent: greater than 30 minutes.  This record has been created using Systems analyst. Errors have been sought and  corrected,but may not always be located. Such creation errors do not reflect on the standard of care.   Signed: Lorella Nimrod, MD [facility] 02/05/2022

## 2022-02-05 NOTE — Telephone Encounter (Signed)
Noted, see MyChart response with patient's daughter.

## 2022-02-06 ENCOUNTER — Ambulatory Visit: Payer: Medicare HMO

## 2022-02-07 ENCOUNTER — Ambulatory Visit: Payer: Medicare HMO | Admitting: Primary Care

## 2022-02-07 DIAGNOSIS — Z792 Long term (current) use of antibiotics: Secondary | ICD-10-CM | POA: Diagnosis not present

## 2022-02-07 DIAGNOSIS — Z9049 Acquired absence of other specified parts of digestive tract: Secondary | ICD-10-CM | POA: Diagnosis not present

## 2022-02-07 DIAGNOSIS — F0284 Dementia in other diseases classified elsewhere, unspecified severity, with anxiety: Secondary | ICD-10-CM | POA: Diagnosis not present

## 2022-02-07 DIAGNOSIS — F411 Generalized anxiety disorder: Secondary | ICD-10-CM | POA: Diagnosis not present

## 2022-02-07 DIAGNOSIS — G309 Alzheimer's disease, unspecified: Secondary | ICD-10-CM | POA: Diagnosis not present

## 2022-02-07 DIAGNOSIS — K219 Gastro-esophageal reflux disease without esophagitis: Secondary | ICD-10-CM | POA: Diagnosis not present

## 2022-02-07 DIAGNOSIS — N390 Urinary tract infection, site not specified: Secondary | ICD-10-CM | POA: Diagnosis not present

## 2022-02-09 ENCOUNTER — Ambulatory Visit (INDEPENDENT_AMBULATORY_CARE_PROVIDER_SITE_OTHER): Payer: Medicare HMO | Admitting: Primary Care

## 2022-02-09 ENCOUNTER — Encounter: Payer: Self-pay | Admitting: Primary Care

## 2022-02-09 ENCOUNTER — Ambulatory Visit: Payer: Medicare HMO

## 2022-02-09 VITALS — BP 136/90 | HR 75 | Temp 97.5°F | Ht 64.0 in | Wt 252.0 lb

## 2022-02-09 DIAGNOSIS — N390 Urinary tract infection, site not specified: Secondary | ICD-10-CM | POA: Diagnosis not present

## 2022-02-09 DIAGNOSIS — Z9049 Acquired absence of other specified parts of digestive tract: Secondary | ICD-10-CM | POA: Diagnosis not present

## 2022-02-09 DIAGNOSIS — G309 Alzheimer's disease, unspecified: Secondary | ICD-10-CM | POA: Diagnosis not present

## 2022-02-09 DIAGNOSIS — Z792 Long term (current) use of antibiotics: Secondary | ICD-10-CM | POA: Diagnosis not present

## 2022-02-09 DIAGNOSIS — R011 Cardiac murmur, unspecified: Secondary | ICD-10-CM

## 2022-02-09 DIAGNOSIS — Z9181 History of falling: Secondary | ICD-10-CM | POA: Diagnosis not present

## 2022-02-09 DIAGNOSIS — A0472 Enterocolitis due to Clostridium difficile, not specified as recurrent: Secondary | ICD-10-CM | POA: Diagnosis not present

## 2022-02-09 DIAGNOSIS — K219 Gastro-esophageal reflux disease without esophagitis: Secondary | ICD-10-CM | POA: Diagnosis not present

## 2022-02-09 DIAGNOSIS — F0284 Dementia in other diseases classified elsewhere, unspecified severity, with anxiety: Secondary | ICD-10-CM | POA: Diagnosis not present

## 2022-02-09 DIAGNOSIS — F411 Generalized anxiety disorder: Secondary | ICD-10-CM | POA: Diagnosis not present

## 2022-02-09 LAB — CBC WITH DIFFERENTIAL/PLATELET
Basophils Absolute: 0 10*3/uL (ref 0.0–0.1)
Basophils Relative: 0.6 % (ref 0.0–3.0)
Eosinophils Absolute: 0.5 10*3/uL (ref 0.0–0.7)
Eosinophils Relative: 5.5 % — ABNORMAL HIGH (ref 0.0–5.0)
HCT: 44.9 % (ref 36.0–46.0)
Hemoglobin: 15 g/dL (ref 12.0–15.0)
Lymphocytes Relative: 17.9 % (ref 12.0–46.0)
Lymphs Abs: 1.5 10*3/uL (ref 0.7–4.0)
MCHC: 33.3 g/dL (ref 30.0–36.0)
MCV: 89.1 fl (ref 78.0–100.0)
Monocytes Absolute: 0.7 10*3/uL (ref 0.1–1.0)
Monocytes Relative: 7.9 % (ref 3.0–12.0)
Neutro Abs: 5.8 10*3/uL (ref 1.4–7.7)
Neutrophils Relative %: 68.1 % (ref 43.0–77.0)
Platelets: 258 10*3/uL (ref 150.0–400.0)
RBC: 5.04 Mil/uL (ref 3.87–5.11)
RDW: 14.2 % (ref 11.5–15.5)
WBC: 8.5 10*3/uL (ref 4.0–10.5)

## 2022-02-09 LAB — BASIC METABOLIC PANEL
BUN: 11 mg/dL (ref 6–23)
CO2: 28 mEq/L (ref 19–32)
Calcium: 9.3 mg/dL (ref 8.4–10.5)
Chloride: 103 mEq/L (ref 96–112)
Creatinine, Ser: 1.04 mg/dL (ref 0.40–1.20)
GFR: 51.84 mL/min — ABNORMAL LOW (ref >60.00)
Glucose, Bld: 95 mg/dL (ref 70–99)
Potassium: 4.5 mEq/L (ref 3.5–5.1)
Sodium: 143 mEq/L (ref 135–145)

## 2022-02-09 MED ORDER — ONDANSETRON 4 MG PO TBDP: 4.0000 mg | ORAL_TABLET | Freq: Three times a day (TID) | ORAL | 0 refills | Status: AC | PRN

## 2022-02-09 NOTE — Assessment & Plan Note (Signed)
Reviewed ED and hospital notes, labs, imaging.  Continue vancomycin 120 mg 4 times daily x 10 days. Refills for Zofran provided.  Advance diet as tolerated. Work on hydration.  Repeat CBC and BMP pending.
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
## 2022-02-09 NOTE — Assessment & Plan Note (Signed)
Noted on exam, appears to be louder than usual.  Reviewed echocardiogram from 2018.  Both her daughter and I agree that we should repeat echocardiogram, orders placed.

## 2022-02-13 ENCOUNTER — Telehealth: Payer: Self-pay | Admitting: Primary Care

## 2022-02-13 DIAGNOSIS — Z792 Long term (current) use of antibiotics: Secondary | ICD-10-CM | POA: Diagnosis not present

## 2022-02-13 DIAGNOSIS — F0284 Dementia in other diseases classified elsewhere, unspecified severity, with anxiety: Secondary | ICD-10-CM | POA: Diagnosis not present

## 2022-02-13 DIAGNOSIS — Z9181 History of falling: Secondary | ICD-10-CM | POA: Diagnosis not present

## 2022-02-13 DIAGNOSIS — K219 Gastro-esophageal reflux disease without esophagitis: Secondary | ICD-10-CM | POA: Diagnosis not present

## 2022-02-13 DIAGNOSIS — N390 Urinary tract infection, site not specified: Secondary | ICD-10-CM | POA: Diagnosis not present

## 2022-02-13 DIAGNOSIS — Z9049 Acquired absence of other specified parts of digestive tract: Secondary | ICD-10-CM | POA: Diagnosis not present

## 2022-02-13 DIAGNOSIS — G309 Alzheimer's disease, unspecified: Secondary | ICD-10-CM | POA: Diagnosis not present

## 2022-02-13 DIAGNOSIS — F411 Generalized anxiety disorder: Secondary | ICD-10-CM | POA: Diagnosis not present

## 2022-02-13 NOTE — Telephone Encounter (Signed)
Home Health verbal orders Caller Name: [place] Name: [facility] number: [phone], secured   Requesting OT/PT/Skilled nursing/Social Work/Speech: OT  Reason:  Frequency: once week six with re certification   Please forward to [facility] pool or providers CMA

## 2022-02-14 NOTE — Telephone Encounter (Signed)
Called and gave approval of verbal orders to [employer].

## 2022-02-14 NOTE — Telephone Encounter (Signed)
Approved.  

## 2022-02-15 DIAGNOSIS — K219 Gastro-esophageal reflux disease without esophagitis: Secondary | ICD-10-CM | POA: Diagnosis not present

## 2022-02-15 DIAGNOSIS — G309 Alzheimer's disease, unspecified: Secondary | ICD-10-CM | POA: Diagnosis not present

## 2022-02-15 DIAGNOSIS — F0284 Dementia in other diseases classified elsewhere, unspecified severity, with anxiety: Secondary | ICD-10-CM | POA: Diagnosis not present

## 2022-02-15 DIAGNOSIS — Z9181 History of falling: Secondary | ICD-10-CM | POA: Diagnosis not present

## 2022-02-15 DIAGNOSIS — F411 Generalized anxiety disorder: Secondary | ICD-10-CM | POA: Diagnosis not present

## 2022-02-15 DIAGNOSIS — N390 Urinary tract infection, site not specified: Secondary | ICD-10-CM | POA: Diagnosis not present

## 2022-02-15 DIAGNOSIS — Z792 Long term (current) use of antibiotics: Secondary | ICD-10-CM | POA: Diagnosis not present

## 2022-02-15 DIAGNOSIS — Z9049 Acquired absence of other specified parts of digestive tract: Secondary | ICD-10-CM | POA: Diagnosis not present

## 2022-02-16 ENCOUNTER — Other Ambulatory Visit (INDEPENDENT_AMBULATORY_CARE_PROVIDER_SITE_OTHER): Payer: Medicare HMO

## 2022-02-16 DIAGNOSIS — F0284 Dementia in other diseases classified elsewhere, unspecified severity, with anxiety: Secondary | ICD-10-CM | POA: Diagnosis not present

## 2022-02-16 DIAGNOSIS — Z9049 Acquired absence of other specified parts of digestive tract: Secondary | ICD-10-CM | POA: Diagnosis not present

## 2022-02-16 DIAGNOSIS — Z792 Long term (current) use of antibiotics: Secondary | ICD-10-CM | POA: Diagnosis not present

## 2022-02-16 DIAGNOSIS — Z9181 History of falling: Secondary | ICD-10-CM | POA: Diagnosis not present

## 2022-02-16 DIAGNOSIS — K219 Gastro-esophageal reflux disease without esophagitis: Secondary | ICD-10-CM | POA: Diagnosis not present

## 2022-02-16 DIAGNOSIS — R5383 Other fatigue: Secondary | ICD-10-CM

## 2022-02-16 DIAGNOSIS — A0472 Enterocolitis due to Clostridium difficile, not specified as recurrent: Secondary | ICD-10-CM | POA: Diagnosis not present

## 2022-02-16 DIAGNOSIS — F411 Generalized anxiety disorder: Secondary | ICD-10-CM | POA: Diagnosis not present

## 2022-02-16 DIAGNOSIS — G309 Alzheimer's disease, unspecified: Secondary | ICD-10-CM | POA: Diagnosis not present

## 2022-02-16 DIAGNOSIS — N390 Urinary tract infection, site not specified: Secondary | ICD-10-CM | POA: Diagnosis not present

## 2022-02-16 LAB — POC URINALSYSI DIPSTICK (AUTOMATED)
Bilirubin, UA: NEGATIVE
Blood, UA: NEGATIVE
Glucose, UA: NEGATIVE
Ketones, UA: NEGATIVE
Nitrite, UA: POSITIVE — AB
Protein, UA: POSITIVE — AB
Spec Grav, UA: <=1.005 — AB (ref 1.010–1.025)
Urobilinogen, UA: 0.2 E.U./dL
pH, UA: >=9 — AB (ref 5.0–8.0)

## 2022-02-16 LAB — BASIC METABOLIC PANEL
BUN: 15 mg/dL (ref 6–23)
CO2: 25 mEq/L (ref 19–32)
Calcium: 9.4 mg/dL (ref 8.4–10.5)
Chloride: 105 mEq/L (ref 96–112)
Creatinine, Ser: 1.13 mg/dL (ref 0.40–1.20)
GFR: 46.92 mL/min — ABNORMAL LOW (ref >60.00)
Glucose, Bld: 96 mg/dL (ref 70–99)
Potassium: 3.9 mEq/L (ref 3.5–5.1)
Sodium: 141 mEq/L (ref 135–145)

## 2022-02-16 LAB — CBC WITH DIFFERENTIAL/PLATELET
Basophils Absolute: 0 10*3/uL (ref 0.0–0.1)
Basophils Relative: 0.6 % (ref 0.0–3.0)
Eosinophils Absolute: 0.3 10*3/uL (ref 0.0–0.7)
Eosinophils Relative: 3.6 % (ref 0.0–5.0)
HCT: 48 % — ABNORMAL HIGH (ref 36.0–46.0)
Hemoglobin: 15.6 g/dL — ABNORMAL HIGH (ref 12.0–15.0)
Lymphocytes Relative: 16.1 % (ref 12.0–46.0)
Lymphs Abs: 1.2 10*3/uL (ref 0.7–4.0)
MCHC: 32.5 g/dL (ref 30.0–36.0)
MCV: 90.5 fl (ref 78.0–100.0)
Monocytes Absolute: 0.5 10*3/uL (ref 0.1–1.0)
Monocytes Relative: 6.5 % (ref 3.0–12.0)
Neutro Abs: 5.3 10*3/uL (ref 1.4–7.7)
Neutrophils Relative %: 73.2 % (ref 43.0–77.0)
Platelets: 226 10*3/uL (ref 150.0–400.0)
RBC: 5.31 Mil/uL — ABNORMAL HIGH (ref 3.87–5.11)
RDW: 14.3 % (ref 11.5–15.5)
WBC: 7.2 10*3/uL (ref 4.0–10.5)

## 2022-02-16 NOTE — Addendum Note (Signed)
Addended by: Tammi Sou on: 02/16/2022 10:04 AM   Modules accepted: Orders

## 2022-02-16 NOTE — Telephone Encounter (Signed)
Added to lab schedule for 1/19

## 2022-02-16 NOTE — Telephone Encounter (Signed)
I'm having this patient come in this morning for labs. Will you put her on the lab schedule?

## 2022-02-19 LAB — URINE CULTURE
MICRO NUMBER:: 14448267
SPECIMEN QUALITY:: ADEQUATE

## 2022-02-20 ENCOUNTER — Other Ambulatory Visit: Payer: Self-pay | Admitting: Primary Care

## 2022-02-20 DIAGNOSIS — N3000 Acute cystitis without hematuria: Secondary | ICD-10-CM

## 2022-02-20 MED ORDER — SULFAMETHOXAZOLE-TRIMETHOPRIM 800-160 MG PO TABS: 1.0000 | ORAL_TABLET | Freq: Two times a day (BID) | ORAL | 0 refills | Status: DC

## 2022-02-21 ENCOUNTER — Ambulatory Visit: Payer: Medicare HMO

## 2022-02-22 ENCOUNTER — Ambulatory Visit: Payer: Medicare HMO

## 2022-02-22 DIAGNOSIS — G309 Alzheimer's disease, unspecified: Secondary | ICD-10-CM | POA: Diagnosis not present

## 2022-02-22 DIAGNOSIS — Z9049 Acquired absence of other specified parts of digestive tract: Secondary | ICD-10-CM | POA: Diagnosis not present

## 2022-02-22 DIAGNOSIS — N390 Urinary tract infection, site not specified: Secondary | ICD-10-CM | POA: Diagnosis not present

## 2022-02-22 DIAGNOSIS — Z792 Long term (current) use of antibiotics: Secondary | ICD-10-CM | POA: Diagnosis not present

## 2022-02-22 DIAGNOSIS — Z9181 History of falling: Secondary | ICD-10-CM | POA: Diagnosis not present

## 2022-02-22 DIAGNOSIS — K219 Gastro-esophageal reflux disease without esophagitis: Secondary | ICD-10-CM | POA: Diagnosis not present

## 2022-02-22 DIAGNOSIS — F411 Generalized anxiety disorder: Secondary | ICD-10-CM | POA: Diagnosis not present

## 2022-02-22 DIAGNOSIS — F0284 Dementia in other diseases classified elsewhere, unspecified severity, with anxiety: Secondary | ICD-10-CM | POA: Diagnosis not present

## 2022-02-28 ENCOUNTER — Ambulatory Visit: Payer: Medicare HMO | Admitting: Primary Care

## 2022-02-28 DIAGNOSIS — Z9181 History of falling: Secondary | ICD-10-CM | POA: Diagnosis not present

## 2022-02-28 DIAGNOSIS — K219 Gastro-esophageal reflux disease without esophagitis: Secondary | ICD-10-CM | POA: Diagnosis not present

## 2022-02-28 DIAGNOSIS — Z792 Long term (current) use of antibiotics: Secondary | ICD-10-CM | POA: Diagnosis not present

## 2022-02-28 DIAGNOSIS — F411 Generalized anxiety disorder: Secondary | ICD-10-CM | POA: Diagnosis not present

## 2022-02-28 DIAGNOSIS — F0284 Dementia in other diseases classified elsewhere, unspecified severity, with anxiety: Secondary | ICD-10-CM | POA: Diagnosis not present

## 2022-02-28 DIAGNOSIS — G309 Alzheimer's disease, unspecified: Secondary | ICD-10-CM | POA: Diagnosis not present

## 2022-02-28 DIAGNOSIS — Z9049 Acquired absence of other specified parts of digestive tract: Secondary | ICD-10-CM | POA: Diagnosis not present

## 2022-02-28 DIAGNOSIS — N390 Urinary tract infection, site not specified: Secondary | ICD-10-CM | POA: Diagnosis not present

## 2022-03-01 ENCOUNTER — Encounter: Payer: Self-pay | Admitting: *Deleted

## 2022-03-01 ENCOUNTER — Ambulatory Visit (INDEPENDENT_AMBULATORY_CARE_PROVIDER_SITE_OTHER)
Admission: RE | Admit: 2022-03-01 | Discharge: 2022-03-01 | Disposition: A | Discharge status: Home / Self Care | Payer: Medicare HMO | Source: Ambulatory Visit | Attending: Primary Care | Admitting: Primary Care

## 2022-03-01 ENCOUNTER — Ambulatory Visit (INDEPENDENT_AMBULATORY_CARE_PROVIDER_SITE_OTHER): Payer: Medicare HMO | Admitting: Primary Care

## 2022-03-01 ENCOUNTER — Telehealth: Payer: Self-pay

## 2022-03-01 ENCOUNTER — Encounter: Payer: Self-pay | Admitting: Primary Care

## 2022-03-01 VITALS — BP 130/86 | HR 59 | Temp 97.9°F | Ht 64.0 in | Wt 243.0 lb

## 2022-03-01 DIAGNOSIS — Z9181 History of falling: Secondary | ICD-10-CM | POA: Diagnosis not present

## 2022-03-01 DIAGNOSIS — K219 Gastro-esophageal reflux disease without esophagitis: Secondary | ICD-10-CM | POA: Diagnosis not present

## 2022-03-01 DIAGNOSIS — N390 Urinary tract infection, site not specified: Secondary | ICD-10-CM | POA: Diagnosis not present

## 2022-03-01 DIAGNOSIS — R053 Chronic cough: Secondary | ICD-10-CM | POA: Diagnosis not present

## 2022-03-01 DIAGNOSIS — Z9049 Acquired absence of other specified parts of digestive tract: Secondary | ICD-10-CM | POA: Diagnosis not present

## 2022-03-01 DIAGNOSIS — F411 Generalized anxiety disorder: Secondary | ICD-10-CM

## 2022-03-01 DIAGNOSIS — Z792 Long term (current) use of antibiotics: Secondary | ICD-10-CM | POA: Diagnosis not present

## 2022-03-01 DIAGNOSIS — F03B4 Unspecified dementia, moderate, with anxiety: Secondary | ICD-10-CM | POA: Diagnosis not present

## 2022-03-01 DIAGNOSIS — G309 Alzheimer's disease, unspecified: Secondary | ICD-10-CM | POA: Diagnosis not present

## 2022-03-01 DIAGNOSIS — N898 Other specified noninflammatory disorders of vagina: Secondary | ICD-10-CM | POA: Diagnosis not present

## 2022-03-01 DIAGNOSIS — F0284 Dementia in other diseases classified elsewhere, unspecified severity, with anxiety: Secondary | ICD-10-CM | POA: Diagnosis not present

## 2022-03-01 LAB — CBC WITH DIFFERENTIAL/PLATELET
Basophils Absolute: 0.1 10*3/uL (ref 0.0–0.1)
Basophils Relative: 0.9 % (ref 0.0–3.0)
Eosinophils Absolute: 0.4 10*3/uL (ref 0.0–0.7)
Eosinophils Relative: 4.5 % (ref 0.0–5.0)
HCT: 48.4 % — ABNORMAL HIGH (ref 36.0–46.0)
Hemoglobin: 16 g/dL — ABNORMAL HIGH (ref 12.0–15.0)
Lymphocytes Relative: 15.8 % (ref 12.0–46.0)
Lymphs Abs: 1.5 10*3/uL (ref 0.7–4.0)
MCHC: 33.2 g/dL (ref 30.0–36.0)
MCV: 90.2 fl (ref 78.0–100.0)
Monocytes Absolute: 0.7 10*3/uL (ref 0.1–1.0)
Monocytes Relative: 6.9 % (ref 3.0–12.0)
Neutro Abs: 7 10*3/uL (ref 1.4–7.7)
Neutrophils Relative %: 71.9 % (ref 43.0–77.0)
Platelets: 245 10*3/uL (ref 150.0–400.0)
RBC: 5.36 Mil/uL — ABNORMAL HIGH (ref 3.87–5.11)
RDW: 14.6 % (ref 11.5–15.5)
WBC: 9.7 10*3/uL (ref 4.0–10.5)

## 2022-03-01 LAB — BRAIN NATRIURETIC PEPTIDE: Pro B Natriuretic peptide (BNP): 36 pg/mL (ref 0.0–100.0)

## 2022-03-01 MED ORDER — FLUCONAZOLE 150 MG PO TABS: 150.0000 mg | ORAL_TABLET | Freq: Once | ORAL | 0 refills | Status: AC

## 2022-03-01 MED ORDER — DULOXETINE HCL 20 MG PO CPEP: 40.0000 mg | ORAL_CAPSULE | Freq: Every day | ORAL | 1 refills | Status: DC
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
## 2022-03-01 NOTE — Assessment & Plan Note (Signed)
Uncontrolled per daughter.  Agree to increase duloxetine to 40 mg daily. I have also asked that she update neurology, especially given the numerous medications for which she is already taking for her dementia.  Precautions provided.

## 2022-03-01 NOTE — Telephone Encounter (Signed)
03/01/22 No auth required //amg

## 2022-03-01 NOTE — Telephone Encounter (Signed)
Ok, thanks for your help with this.

## 2022-03-01 NOTE — Telephone Encounter (Signed)
I was not made aware that this order was placed. I am working on precert and then the patient can call to get scheduled. I will reach out to the patient to make her aware.

## 2022-03-01 NOTE — Assessment & Plan Note (Signed)
Continued.  Following with neurology.  Continue donepezil 5 mg daily, Namenda 5 mg twice daily, lithium 150 mg daily, mirtazapine 15 mg at bedtime, trazodone 100 mg at bedtime. Will increase dose of duloxetine to 40 mg with precautions of serotonin syndrome to both daughter and patient.  I have also asked that they update neurology.

## 2022-03-01 NOTE — Telephone Encounter (Signed)
Could we check on the status of the echocardiogram Anda Kraft ordered on 02/09/22 for this patient.  Was there an issue with the order?

## 2022-03-01 NOTE — Patient Instructions (Signed)
Take the fluconazole 150 mg tablet today for vaginal itching.  Stop by the lab/xray prior to leaving today. I will notify you of your results once received.   We increased the dose of your duloxetine to 40 mg daily.  Take 2 capsules by mouth daily.  It was a pleasure to see you today!

## 2022-03-01 NOTE — Assessment & Plan Note (Signed)
Unclear etiology, doubt infectious cause especially given the multiple rounds of antibiotics she has been on over the last few months.  Other differentials include CHF, GERD, pulmonary effusion.  Checking chest x-ray today. Checking BNP today.

## 2022-03-01 NOTE — Telephone Encounter (Signed)
Pt is aware to call and schedule Echo at her convenience with [facility].

## 2022-03-01 NOTE — Assessment & Plan Note (Signed)
Yeast vaginitis is possible given recurrent antibiotic use within the last 2 months.  Wet prep collected and pending today. Await results.

## 2022-03-02 DIAGNOSIS — N390 Urinary tract infection, site not specified: Secondary | ICD-10-CM | POA: Diagnosis not present

## 2022-03-02 DIAGNOSIS — F0284 Dementia in other diseases classified elsewhere, unspecified severity, with anxiety: Secondary | ICD-10-CM | POA: Diagnosis not present

## 2022-03-02 DIAGNOSIS — Z792 Long term (current) use of antibiotics: Secondary | ICD-10-CM | POA: Diagnosis not present

## 2022-03-02 DIAGNOSIS — G309 Alzheimer's disease, unspecified: Secondary | ICD-10-CM | POA: Diagnosis not present

## 2022-03-02 DIAGNOSIS — Z9049 Acquired absence of other specified parts of digestive tract: Secondary | ICD-10-CM | POA: Diagnosis not present

## 2022-03-02 DIAGNOSIS — K219 Gastro-esophageal reflux disease without esophagitis: Secondary | ICD-10-CM | POA: Diagnosis not present

## 2022-03-02 DIAGNOSIS — Z9181 History of falling: Secondary | ICD-10-CM | POA: Diagnosis not present

## 2022-03-02 DIAGNOSIS — F411 Generalized anxiety disorder: Secondary | ICD-10-CM | POA: Diagnosis not present

## 2022-03-02 LAB — WET PREP BY MOLECULAR PROBE
Candida species: DETECTED — AB
Gardnerella vaginalis: NOT DETECTED
MICRO NUMBER:: 14505739
SPECIMEN QUALITY:: ADEQUATE
Trichomonas vaginosis: NOT DETECTED

## 2022-03-06 DIAGNOSIS — G309 Alzheimer's disease, unspecified: Secondary | ICD-10-CM | POA: Diagnosis not present

## 2022-03-06 DIAGNOSIS — K219 Gastro-esophageal reflux disease without esophagitis: Secondary | ICD-10-CM | POA: Diagnosis not present

## 2022-03-06 DIAGNOSIS — N390 Urinary tract infection, site not specified: Secondary | ICD-10-CM | POA: Diagnosis not present

## 2022-03-06 DIAGNOSIS — Z9181 History of falling: Secondary | ICD-10-CM | POA: Diagnosis not present

## 2022-03-06 DIAGNOSIS — F0284 Dementia in other diseases classified elsewhere, unspecified severity, with anxiety: Secondary | ICD-10-CM | POA: Diagnosis not present

## 2022-03-06 DIAGNOSIS — Z9049 Acquired absence of other specified parts of digestive tract: Secondary | ICD-10-CM | POA: Diagnosis not present

## 2022-03-06 DIAGNOSIS — F411 Generalized anxiety disorder: Secondary | ICD-10-CM | POA: Diagnosis not present

## 2022-03-06 DIAGNOSIS — Z792 Long term (current) use of antibiotics: Secondary | ICD-10-CM | POA: Diagnosis not present

## 2022-03-07 DIAGNOSIS — G309 Alzheimer's disease, unspecified: Secondary | ICD-10-CM | POA: Diagnosis not present

## 2022-03-07 DIAGNOSIS — F0284 Dementia in other diseases classified elsewhere, unspecified severity, with anxiety: Secondary | ICD-10-CM | POA: Diagnosis not present

## 2022-03-07 DIAGNOSIS — Z9181 History of falling: Secondary | ICD-10-CM | POA: Diagnosis not present

## 2022-03-07 DIAGNOSIS — K219 Gastro-esophageal reflux disease without esophagitis: Secondary | ICD-10-CM | POA: Diagnosis not present

## 2022-03-07 DIAGNOSIS — Z792 Long term (current) use of antibiotics: Secondary | ICD-10-CM | POA: Diagnosis not present

## 2022-03-07 DIAGNOSIS — N390 Urinary tract infection, site not specified: Secondary | ICD-10-CM | POA: Diagnosis not present

## 2022-03-07 DIAGNOSIS — F411 Generalized anxiety disorder: Secondary | ICD-10-CM | POA: Diagnosis not present

## 2022-03-07 DIAGNOSIS — Z9049 Acquired absence of other specified parts of digestive tract: Secondary | ICD-10-CM | POA: Diagnosis not present

## 2022-03-14 ENCOUNTER — Telehealth: Payer: Self-pay | Admitting: Primary Care

## 2022-03-14 DIAGNOSIS — Z792 Long term (current) use of antibiotics: Secondary | ICD-10-CM | POA: Diagnosis not present

## 2022-03-14 DIAGNOSIS — K219 Gastro-esophageal reflux disease without esophagitis: Secondary | ICD-10-CM | POA: Diagnosis not present

## 2022-03-14 DIAGNOSIS — N390 Urinary tract infection, site not specified: Secondary | ICD-10-CM | POA: Diagnosis not present

## 2022-03-14 DIAGNOSIS — F411 Generalized anxiety disorder: Secondary | ICD-10-CM | POA: Diagnosis not present

## 2022-03-14 DIAGNOSIS — G309 Alzheimer's disease, unspecified: Secondary | ICD-10-CM | POA: Diagnosis not present

## 2022-03-14 DIAGNOSIS — F0284 Dementia in other diseases classified elsewhere, unspecified severity, with anxiety: Secondary | ICD-10-CM | POA: Diagnosis not present

## 2022-03-14 DIAGNOSIS — Z9181 History of falling: Secondary | ICD-10-CM | POA: Diagnosis not present

## 2022-03-14 DIAGNOSIS — Z9049 Acquired absence of other specified parts of digestive tract: Secondary | ICD-10-CM | POA: Diagnosis not present

## 2022-03-14 NOTE — Telephone Encounter (Signed)
Randy from [facility] called in after seeing patient today and daughter stated that she I showing symptoms of a uti. He would like to know if Anda Kraft would like for them to collect a urine specimen on her,if so could orders please be faxed to [phone] Attn:anna

## 2022-03-14 NOTE — Telephone Encounter (Signed)
Left voicemail with Lindsay Snyder to return call advising what symptoms the patient is experiencing so we can provide diagnosis code for the order.

## 2022-03-14 NOTE — Telephone Encounter (Signed)
We need to know the symptoms for which she is experiencing for her diagnosis code.  Prescription placed in [place] ready for faxing once we have the diagnosis code.

## 2022-03-15 ENCOUNTER — Other Ambulatory Visit: Payer: Self-pay | Admitting: Primary Care

## 2022-03-15 DIAGNOSIS — Z792 Long term (current) use of antibiotics: Secondary | ICD-10-CM | POA: Diagnosis not present

## 2022-03-15 DIAGNOSIS — G309 Alzheimer's disease, unspecified: Secondary | ICD-10-CM | POA: Diagnosis not present

## 2022-03-15 DIAGNOSIS — F411 Generalized anxiety disorder: Secondary | ICD-10-CM | POA: Diagnosis not present

## 2022-03-15 DIAGNOSIS — Z9181 History of falling: Secondary | ICD-10-CM | POA: Diagnosis not present

## 2022-03-15 DIAGNOSIS — K219 Gastro-esophageal reflux disease without esophagitis: Secondary | ICD-10-CM | POA: Diagnosis not present

## 2022-03-15 DIAGNOSIS — F0284 Dementia in other diseases classified elsewhere, unspecified severity, with anxiety: Secondary | ICD-10-CM | POA: Diagnosis not present

## 2022-03-15 DIAGNOSIS — Z9049 Acquired absence of other specified parts of digestive tract: Secondary | ICD-10-CM | POA: Diagnosis not present

## 2022-03-15 DIAGNOSIS — N390 Urinary tract infection, site not specified: Secondary | ICD-10-CM | POA: Diagnosis not present

## 2022-03-15 NOTE — Telephone Encounter (Signed)
Called and spoke with Louie Casa from [facility] he states that the patient is not having any urinary symptoms like dysuria or frequency. The patient is experiencing confusion and excess fatigue. The patients daughter advised Louie Casa that these are usually symptoms that suggest the patient has a UTI, as this has occurred in the past.  What diagnosis code do you want to use?

## 2022-03-15 NOTE — Telephone Encounter (Signed)
We can write confusion and fatigue on the Rx.

## 2022-03-15 NOTE — Telephone Encounter (Signed)
Rx faxed to [facility] at number provided. Confirmation received.

## 2022-03-16 DIAGNOSIS — N390 Urinary tract infection, site not specified: Secondary | ICD-10-CM | POA: Diagnosis not present

## 2022-03-16 DIAGNOSIS — F411 Generalized anxiety disorder: Secondary | ICD-10-CM | POA: Diagnosis not present

## 2022-03-16 DIAGNOSIS — G309 Alzheimer's disease, unspecified: Secondary | ICD-10-CM | POA: Diagnosis not present

## 2022-03-16 DIAGNOSIS — F0284 Dementia in other diseases classified elsewhere, unspecified severity, with anxiety: Secondary | ICD-10-CM | POA: Diagnosis not present

## 2022-03-16 DIAGNOSIS — Z792 Long term (current) use of antibiotics: Secondary | ICD-10-CM | POA: Diagnosis not present

## 2022-03-16 DIAGNOSIS — K219 Gastro-esophageal reflux disease without esophagitis: Secondary | ICD-10-CM | POA: Diagnosis not present

## 2022-03-16 DIAGNOSIS — Z9181 History of falling: Secondary | ICD-10-CM | POA: Diagnosis not present

## 2022-03-16 DIAGNOSIS — Z9049 Acquired absence of other specified parts of digestive tract: Secondary | ICD-10-CM | POA: Diagnosis not present

## 2022-03-20 ENCOUNTER — Ambulatory Visit: Payer: Medicare HMO

## 2022-03-21 DIAGNOSIS — Z9049 Acquired absence of other specified parts of digestive tract: Secondary | ICD-10-CM | POA: Diagnosis not present

## 2022-03-21 DIAGNOSIS — Z9181 History of falling: Secondary | ICD-10-CM | POA: Diagnosis not present

## 2022-03-21 DIAGNOSIS — F411 Generalized anxiety disorder: Secondary | ICD-10-CM | POA: Diagnosis not present

## 2022-03-21 DIAGNOSIS — K219 Gastro-esophageal reflux disease without esophagitis: Secondary | ICD-10-CM | POA: Diagnosis not present

## 2022-03-21 DIAGNOSIS — G309 Alzheimer's disease, unspecified: Secondary | ICD-10-CM | POA: Diagnosis not present

## 2022-03-21 DIAGNOSIS — F0284 Dementia in other diseases classified elsewhere, unspecified severity, with anxiety: Secondary | ICD-10-CM | POA: Diagnosis not present

## 2022-03-21 DIAGNOSIS — Z792 Long term (current) use of antibiotics: Secondary | ICD-10-CM | POA: Diagnosis not present

## 2022-03-21 DIAGNOSIS — N390 Urinary tract infection, site not specified: Secondary | ICD-10-CM | POA: Diagnosis not present

## 2022-03-22 ENCOUNTER — Ambulatory Visit: Payer: Medicare HMO

## 2022-03-22 DIAGNOSIS — G309 Alzheimer's disease, unspecified: Secondary | ICD-10-CM | POA: Diagnosis not present

## 2022-03-22 DIAGNOSIS — Z792 Long term (current) use of antibiotics: Secondary | ICD-10-CM | POA: Diagnosis not present

## 2022-03-22 DIAGNOSIS — F0284 Dementia in other diseases classified elsewhere, unspecified severity, with anxiety: Secondary | ICD-10-CM | POA: Diagnosis not present

## 2022-03-22 DIAGNOSIS — F411 Generalized anxiety disorder: Secondary | ICD-10-CM | POA: Diagnosis not present

## 2022-03-22 DIAGNOSIS — Z9049 Acquired absence of other specified parts of digestive tract: Secondary | ICD-10-CM | POA: Diagnosis not present

## 2022-03-22 DIAGNOSIS — N390 Urinary tract infection, site not specified: Secondary | ICD-10-CM | POA: Diagnosis not present

## 2022-03-22 DIAGNOSIS — K219 Gastro-esophageal reflux disease without esophagitis: Secondary | ICD-10-CM | POA: Diagnosis not present

## 2022-03-22 DIAGNOSIS — Z9181 History of falling: Secondary | ICD-10-CM | POA: Diagnosis not present

## 2022-03-26 DIAGNOSIS — G479 Sleep disorder, unspecified: Secondary | ICD-10-CM | POA: Diagnosis not present

## 2022-03-26 DIAGNOSIS — F03B18 Unspecified dementia, moderate, with other behavioral disturbance: Secondary | ICD-10-CM | POA: Diagnosis not present

## 2022-03-26 DIAGNOSIS — R413 Other amnesia: Secondary | ICD-10-CM | POA: Diagnosis not present

## 2022-03-26 DIAGNOSIS — F411 Generalized anxiety disorder: Secondary | ICD-10-CM | POA: Diagnosis not present

## 2022-03-27 DIAGNOSIS — Z792 Long term (current) use of antibiotics: Secondary | ICD-10-CM | POA: Diagnosis not present

## 2022-03-27 DIAGNOSIS — G309 Alzheimer's disease, unspecified: Secondary | ICD-10-CM | POA: Diagnosis not present

## 2022-03-27 DIAGNOSIS — K219 Gastro-esophageal reflux disease without esophagitis: Secondary | ICD-10-CM | POA: Diagnosis not present

## 2022-03-27 DIAGNOSIS — Z9049 Acquired absence of other specified parts of digestive tract: Secondary | ICD-10-CM | POA: Diagnosis not present

## 2022-03-27 DIAGNOSIS — F0284 Dementia in other diseases classified elsewhere, unspecified severity, with anxiety: Secondary | ICD-10-CM | POA: Diagnosis not present

## 2022-03-27 DIAGNOSIS — N390 Urinary tract infection, site not specified: Secondary | ICD-10-CM | POA: Diagnosis not present

## 2022-03-27 DIAGNOSIS — F411 Generalized anxiety disorder: Secondary | ICD-10-CM | POA: Diagnosis not present

## 2022-03-27 DIAGNOSIS — Z9181 History of falling: Secondary | ICD-10-CM | POA: Diagnosis not present

## 2022-03-28 ENCOUNTER — Telehealth: Payer: Self-pay | Admitting: Primary Care

## 2022-03-28 NOTE — Telephone Encounter (Signed)
Verbal orders given to [employer]

## 2022-03-28 NOTE — Telephone Encounter (Signed)
Approved.  

## 2022-03-28 NOTE — Telephone Encounter (Signed)
Home Health verbal orders [facility] Name: [place] number: [phone]  Requesting OT/PT/Skilled nursing/Social Work/Speech:  Reason:OT  Frequency: 1 w 6  Please forward to [facility] pool or providers CMA

## 2022-03-29 DIAGNOSIS — K219 Gastro-esophageal reflux disease without esophagitis: Secondary | ICD-10-CM | POA: Diagnosis not present

## 2022-03-29 DIAGNOSIS — Z792 Long term (current) use of antibiotics: Secondary | ICD-10-CM | POA: Diagnosis not present

## 2022-03-29 DIAGNOSIS — Z9181 History of falling: Secondary | ICD-10-CM | POA: Diagnosis not present

## 2022-03-29 DIAGNOSIS — Z9049 Acquired absence of other specified parts of digestive tract: Secondary | ICD-10-CM | POA: Diagnosis not present

## 2022-03-29 DIAGNOSIS — F0284 Dementia in other diseases classified elsewhere, unspecified severity, with anxiety: Secondary | ICD-10-CM | POA: Diagnosis not present

## 2022-03-29 DIAGNOSIS — F411 Generalized anxiety disorder: Secondary | ICD-10-CM | POA: Diagnosis not present

## 2022-03-29 DIAGNOSIS — N390 Urinary tract infection, site not specified: Secondary | ICD-10-CM | POA: Diagnosis not present

## 2022-03-29 DIAGNOSIS — G309 Alzheimer's disease, unspecified: Secondary | ICD-10-CM | POA: Diagnosis not present

## 2022-03-30 DIAGNOSIS — N390 Urinary tract infection, site not specified: Secondary | ICD-10-CM | POA: Diagnosis not present

## 2022-03-30 DIAGNOSIS — G309 Alzheimer's disease, unspecified: Secondary | ICD-10-CM | POA: Diagnosis not present

## 2022-03-30 DIAGNOSIS — Z792 Long term (current) use of antibiotics: Secondary | ICD-10-CM | POA: Diagnosis not present

## 2022-03-30 DIAGNOSIS — Z9181 History of falling: Secondary | ICD-10-CM | POA: Diagnosis not present

## 2022-03-30 DIAGNOSIS — Z9049 Acquired absence of other specified parts of digestive tract: Secondary | ICD-10-CM | POA: Diagnosis not present

## 2022-03-30 DIAGNOSIS — F411 Generalized anxiety disorder: Secondary | ICD-10-CM | POA: Diagnosis not present

## 2022-03-30 DIAGNOSIS — K219 Gastro-esophageal reflux disease without esophagitis: Secondary | ICD-10-CM | POA: Diagnosis not present

## 2022-03-30 DIAGNOSIS — F0284 Dementia in other diseases classified elsewhere, unspecified severity, with anxiety: Secondary | ICD-10-CM | POA: Diagnosis not present

## 2022-04-02 ENCOUNTER — Other Ambulatory Visit: Payer: Self-pay | Admitting: Primary Care

## 2022-04-02 DIAGNOSIS — F411 Generalized anxiety disorder: Secondary | ICD-10-CM

## 2022-04-02 NOTE — Telephone Encounter (Signed)
Called and spoke with patients daughter they no longer use this [facility] ([address]) . They are using the [facility] [address]. They have refills on file at that location. Can refuse this request. Nothing further needed at this time.

## 2022-04-02 NOTE — Telephone Encounter (Signed)
Please call patient's daughter:  Received refill request for Cymbalta, this was sent to [facility] [address] in early Feb 2024 for 6 month supply. Do they prefer the other [facility] on [address]?

## 2022-04-05 DIAGNOSIS — F411 Generalized anxiety disorder: Secondary | ICD-10-CM | POA: Diagnosis not present

## 2022-04-05 DIAGNOSIS — Z9181 History of falling: Secondary | ICD-10-CM

## 2022-04-05 DIAGNOSIS — Z9049 Acquired absence of other specified parts of digestive tract: Secondary | ICD-10-CM

## 2022-04-05 DIAGNOSIS — F0284 Dementia in other diseases classified elsewhere, unspecified severity, with anxiety: Secondary | ICD-10-CM | POA: Diagnosis not present

## 2022-04-05 DIAGNOSIS — N390 Urinary tract infection, site not specified: Secondary | ICD-10-CM | POA: Diagnosis not present

## 2022-04-05 DIAGNOSIS — Z792 Long term (current) use of antibiotics: Secondary | ICD-10-CM

## 2022-04-05 DIAGNOSIS — G309 Alzheimer's disease, unspecified: Secondary | ICD-10-CM | POA: Diagnosis not present

## 2022-04-05 DIAGNOSIS — K219 Gastro-esophageal reflux disease without esophagitis: Secondary | ICD-10-CM

## 2022-04-09 ENCOUNTER — Ambulatory Visit: Payer: Medicare HMO | Admitting: Podiatry

## 2022-04-09 ENCOUNTER — Encounter: Payer: Self-pay | Admitting: Podiatry

## 2022-04-09 ENCOUNTER — Other Ambulatory Visit: Payer: Self-pay | Admitting: Primary Care

## 2022-04-09 VITALS — BP 133/90 | HR 72

## 2022-04-09 DIAGNOSIS — M79675 Pain in left toe(s): Secondary | ICD-10-CM | POA: Diagnosis not present

## 2022-04-09 DIAGNOSIS — M79674 Pain in right toe(s): Secondary | ICD-10-CM | POA: Diagnosis not present

## 2022-04-09 DIAGNOSIS — B351 Tinea unguium: Secondary | ICD-10-CM

## 2022-04-09 DIAGNOSIS — R32 Unspecified urinary incontinence: Secondary | ICD-10-CM

## 2022-04-09 MED ORDER — OXYBUTYNIN CHLORIDE ER 15 MG PO TB24: ORAL_TABLET | ORAL | 0 refills | Status: DC

## 2022-04-09 NOTE — Telephone Encounter (Signed)
From: [name] To: Office of Pleas Koch, NP Sent: 04/07/2022 5:23 PM EST Subject: Medication Renewal Request  Refills have been requested for the following medications:   oxybutynin (DITROPAN XL) 15 MG 24 hr tablet [[name]]  Patient Comment: Please fill at [place]   Preferred pharmacy: [facility] [place], [place]  This message is being sent by Henrietta Dine on behalf of Lindsay Snyder

## 2022-04-10 NOTE — Progress Notes (Signed)
  Subjective:  Patient ID: Lindsay Snyder, female    DOB: 09/22/1945,  MRN: [id number]  Chief Complaint  Patient presents with   Nail Problem    "Do her toenails."    77 y.o. female presents with the above complaint. History confirmed with patient.  Toenails are thickened elongated yellow discolored causing pain in shoe gear, last debridement was quite helpful  Objective:  Physical Exam: warm, good capillary refill, no trophic changes or ulcerative lesions, normal DP and PT pulses, and normal sensory exam. Left Foot: dystrophic yellowed discolored nail plates with subungual debris Right Foot: dystrophic yellowed discolored nail plates with subungual debris  Assessment:   1. Pain due to onychomycosis of toenails of both feet      Plan:  Patient was evaluated and treated and all questions answered.  Discussed the etiology and treatment options for the condition in detail with the patient. Educated patient on the topical and oral treatment options for mycotic nails. Recommended debridement of the nails today. Sharp and mechanical debridement performed of all painful and mycotic nails today. Nails debrided in length and thickness using a nail nipper to level of comfort. Discussed treatment options including appropriate shoe gear. Follow up as needed for painful nails.    No follow-ups on file.

## 2022-04-11 DIAGNOSIS — J302 Other seasonal allergic rhinitis: Secondary | ICD-10-CM | POA: Diagnosis not present

## 2022-04-11 DIAGNOSIS — Z556 Problems related to health literacy: Secondary | ICD-10-CM | POA: Diagnosis not present

## 2022-04-11 DIAGNOSIS — K219 Gastro-esophageal reflux disease without esophagitis: Secondary | ICD-10-CM | POA: Diagnosis not present

## 2022-04-11 DIAGNOSIS — F02B4 Dementia in other diseases classified elsewhere, moderate, with anxiety: Secondary | ICD-10-CM | POA: Diagnosis not present

## 2022-04-11 DIAGNOSIS — I8390 Asymptomatic varicose veins of unspecified lower extremity: Secondary | ICD-10-CM | POA: Diagnosis not present

## 2022-04-11 DIAGNOSIS — F02B18 Dementia in other diseases classified elsewhere, moderate, with other behavioral disturbance: Secondary | ICD-10-CM | POA: Diagnosis not present

## 2022-04-11 DIAGNOSIS — G309 Alzheimer's disease, unspecified: Secondary | ICD-10-CM | POA: Diagnosis not present

## 2022-04-11 DIAGNOSIS — Z9089 Acquired absence of other organs: Secondary | ICD-10-CM | POA: Diagnosis not present

## 2022-04-11 DIAGNOSIS — Z604 Social exclusion and rejection: Secondary | ICD-10-CM | POA: Diagnosis not present

## 2022-04-12 DIAGNOSIS — F02B18 Dementia in other diseases classified elsewhere, moderate, with other behavioral disturbance: Secondary | ICD-10-CM | POA: Diagnosis not present

## 2022-04-12 DIAGNOSIS — I8390 Asymptomatic varicose veins of unspecified lower extremity: Secondary | ICD-10-CM | POA: Diagnosis not present

## 2022-04-12 DIAGNOSIS — G309 Alzheimer's disease, unspecified: Secondary | ICD-10-CM | POA: Diagnosis not present

## 2022-04-12 DIAGNOSIS — K219 Gastro-esophageal reflux disease without esophagitis: Secondary | ICD-10-CM | POA: Diagnosis not present

## 2022-04-12 DIAGNOSIS — Z604 Social exclusion and rejection: Secondary | ICD-10-CM | POA: Diagnosis not present

## 2022-04-12 DIAGNOSIS — F02B4 Dementia in other diseases classified elsewhere, moderate, with anxiety: Secondary | ICD-10-CM | POA: Diagnosis not present

## 2022-04-12 DIAGNOSIS — Z556 Problems related to health literacy: Secondary | ICD-10-CM | POA: Diagnosis not present

## 2022-04-12 DIAGNOSIS — Z9089 Acquired absence of other organs: Secondary | ICD-10-CM | POA: Diagnosis not present

## 2022-04-12 DIAGNOSIS — J302 Other seasonal allergic rhinitis: Secondary | ICD-10-CM | POA: Diagnosis not present

## 2022-04-13 ENCOUNTER — Ambulatory Visit
Admission: RE | Admit: 2022-04-13 | Discharge: 2022-04-13 | Disposition: A | Discharge status: Home / Self Care | Payer: Medicare HMO | Source: Ambulatory Visit | Attending: Primary Care | Admitting: Primary Care

## 2022-04-13 DIAGNOSIS — I340 Nonrheumatic mitral (valve) insufficiency: Secondary | ICD-10-CM | POA: Insufficient documentation

## 2022-04-13 DIAGNOSIS — E785 Hyperlipidemia, unspecified: Secondary | ICD-10-CM | POA: Insufficient documentation

## 2022-04-13 DIAGNOSIS — R011 Cardiac murmur, unspecified: Secondary | ICD-10-CM | POA: Diagnosis not present

## 2022-04-13 DIAGNOSIS — F0390 Unspecified dementia without behavioral disturbance: Secondary | ICD-10-CM | POA: Insufficient documentation

## 2022-04-13 LAB — ECHOCARDIOGRAM COMPLETE
AR max vel: 1.42 cm2
AV Area VTI: 1.56 cm2
AV Area mean vel: 1.47 cm2
AV Mean grad: 14 mmHg
AV Peak grad: 28.9 mmHg
Ao pk vel: 2.69 m/s
Area-P 1/2: 3.11 cm2
MV VTI: 2.42 cm2
S' Lateral: 3.1 cm

## 2022-04-13 NOTE — Progress Notes (Signed)
*  PRELIMINARY RESULTS* Echocardiogram 2D Echocardiogram has been performed.  Lindsay Snyder 04/13/2022, 12:02 PM

## 2022-04-19 DIAGNOSIS — Z604 Social exclusion and rejection: Secondary | ICD-10-CM | POA: Diagnosis not present

## 2022-04-19 DIAGNOSIS — Z556 Problems related to health literacy: Secondary | ICD-10-CM | POA: Diagnosis not present

## 2022-04-19 DIAGNOSIS — J302 Other seasonal allergic rhinitis: Secondary | ICD-10-CM | POA: Diagnosis not present

## 2022-04-19 DIAGNOSIS — K219 Gastro-esophageal reflux disease without esophagitis: Secondary | ICD-10-CM | POA: Diagnosis not present

## 2022-04-19 DIAGNOSIS — Z9089 Acquired absence of other organs: Secondary | ICD-10-CM | POA: Diagnosis not present

## 2022-04-19 DIAGNOSIS — G309 Alzheimer's disease, unspecified: Secondary | ICD-10-CM | POA: Diagnosis not present

## 2022-04-19 DIAGNOSIS — I8390 Asymptomatic varicose veins of unspecified lower extremity: Secondary | ICD-10-CM | POA: Diagnosis not present

## 2022-04-19 DIAGNOSIS — F02B4 Dementia in other diseases classified elsewhere, moderate, with anxiety: Secondary | ICD-10-CM | POA: Diagnosis not present

## 2022-04-19 DIAGNOSIS — F02B18 Dementia in other diseases classified elsewhere, moderate, with other behavioral disturbance: Secondary | ICD-10-CM | POA: Diagnosis not present

## 2022-04-23 DIAGNOSIS — F02B18 Dementia in other diseases classified elsewhere, moderate, with other behavioral disturbance: Secondary | ICD-10-CM | POA: Diagnosis not present

## 2022-04-23 DIAGNOSIS — Z604 Social exclusion and rejection: Secondary | ICD-10-CM | POA: Diagnosis not present

## 2022-04-23 DIAGNOSIS — Z556 Problems related to health literacy: Secondary | ICD-10-CM | POA: Diagnosis not present

## 2022-04-23 DIAGNOSIS — F02B4 Dementia in other diseases classified elsewhere, moderate, with anxiety: Secondary | ICD-10-CM | POA: Diagnosis not present

## 2022-04-23 DIAGNOSIS — G309 Alzheimer's disease, unspecified: Secondary | ICD-10-CM | POA: Diagnosis not present

## 2022-04-23 DIAGNOSIS — K219 Gastro-esophageal reflux disease without esophagitis: Secondary | ICD-10-CM | POA: Diagnosis not present

## 2022-04-23 DIAGNOSIS — I8390 Asymptomatic varicose veins of unspecified lower extremity: Secondary | ICD-10-CM | POA: Diagnosis not present

## 2022-04-23 DIAGNOSIS — Z9089 Acquired absence of other organs: Secondary | ICD-10-CM | POA: Diagnosis not present

## 2022-04-23 DIAGNOSIS — J302 Other seasonal allergic rhinitis: Secondary | ICD-10-CM | POA: Diagnosis not present

## 2022-04-24 ENCOUNTER — Telehealth: Payer: Self-pay | Admitting: Primary Care

## 2022-04-24 NOTE — Telephone Encounter (Signed)
Contacted Lindsay Snyder to schedule their annual wellness visit. Appointment made for 05/16/2022.  [place] Direct Dial: [phone]

## 2022-04-25 DIAGNOSIS — N390 Urinary tract infection, site not specified: Secondary | ICD-10-CM

## 2022-04-25 NOTE — Telephone Encounter (Signed)
Placed up front with reception.

## 2022-04-25 NOTE — Telephone Encounter (Signed)
Needs UTI kit for patient's daughter to pick up.

## 2022-04-26 ENCOUNTER — Other Ambulatory Visit: Payer: Medicare HMO

## 2022-04-26 ENCOUNTER — Telehealth: Payer: Self-pay | Admitting: Primary Care

## 2022-04-26 DIAGNOSIS — N390 Urinary tract infection, site not specified: Secondary | ICD-10-CM

## 2022-04-27 LAB — URINE CULTURE
MICRO NUMBER:: 14755097
SPECIMEN QUALITY:: ADEQUATE

## 2022-05-02 ENCOUNTER — Telehealth: Payer: Self-pay

## 2022-05-02 DIAGNOSIS — C801 Malignant (primary) neoplasm, unspecified: Secondary | ICD-10-CM

## 2022-05-04 NOTE — Patient Outreach (Signed)
  Care Coordination   Initial Visit Note   05/04/2022 Late entry for 05/02/22 Name: Lindsay Snyder MRN: [id number] DOB: 1945/05/18  Lindsay Snyder is a 77 y.o. year old female who sees Doreene Nest, NP for primary care. I  spoke with patients daughter/ designated party release Idelle Leech.   What matters to the patients health and wellness today?  Daughter states patient had assistance with [facility] but refused. She states patient is requiring more care with bathing and dressing. Daughter states patient has Medicaid. She states she would like additional resources for patient    Goals Addressed             This Visit's Progress    care coordination activities / resources.       Interventions Today    Flowsheet Row Most Recent Value  Chronic Disease   Chronic disease during today's visit Other  [alzheimers]  General Interventions   General Interventions Discussed/Reviewed General Interventions Discussed, [facility], Doctor Visits  [evaluation of current treatment plan related to Alzheimers and patients adherence to plan as recommended to provider.]  Doctor Visits Discussed/Reviewed Doctor Visits Discussed  [Reviewed scheduled/ upcoming provider]  Mental Health Interventions   Mental Health Discussed/Reviewed Refer to Social Work for resources  Refer to Social Work for resources regarding Other  [in home care providers.]  Pharmacy Interventions   Pharmacy Dicussed/Reviewed Pharmacy Topics Discussed  [medications reviewed and compliance discussed.]              SDOH assessments and interventions completed:  Yes  SDOH Interventions Today    Flowsheet Row Most Recent Value  SDOH Interventions   Food Insecurity Interventions Intervention Not Indicated  Housing Interventions Intervention Not Indicated  Transportation Interventions Intervention Not Indicated        Care Coordination Interventions:  Yes, provided   Follow up plan: Follow up call scheduled  for 06/04/22    Encounter Outcome:  Pt. Visit Completed   Avaleigh Shellhammer RN,BSN,CCM [facility] Care Coordination [phone] direct line

## 2022-05-07 ENCOUNTER — Ambulatory Visit: Payer: Self-pay | Admitting: *Deleted

## 2022-05-07 NOTE — Telephone Encounter (Signed)
Error

## 2022-05-08 NOTE — Patient Outreach (Addendum)
  Care Coordination   Initial Visit Note   05/08/2022 Late Entry Name: Lindsay Snyder MRN: [id number] DOB: Mar 13, 1945  Lindsay Snyder is a 77 y.o. year old female who sees Doreene Nest, NP for primary care. I spoke with  Lorna Dibble daughter by phone on 05/07/22.  What matters to the patients health and wellness today?  In home care resources. Patient's daughter states that she is the primary caregiver for her mother who has been diagnosed with Alzheimer's. Patient's son with her during the day, however daughter works full time and in need of additional support. Patient has Medicaid, however CSW will need to confirm the type of Medicaid for referral for personal care services covered by Medicaid. Contact information for the Med Alert program through [facility] [phone]   Goals Addressed             This Visit's Progress    In home care       Interventions Today    Flowsheet Row Most Recent Value  Chronic Disease   Chronic disease during today's visit Other  [Alzheimers]  General Interventions   General Interventions Discussed/Reviewed General Interventions Discussed, Community Resources, Level of Care  Level of Care Personal Care Services  [facility] Care Services form to be faxed to patient's provider for in home care assistance]  Education Interventions   Education Provided Provided Education  Provided Verbal Education On [facility]  [discussed personal care services that may be able to be obtained through [facility] if patient having full Medicaid can be confirmed]  Safety Interventions   Safety Discussed/Reviewed Safety Discussed  [confirmed that patient's son is at home with patient during the day. Car keys have been removed from patient's reach for safety]  Advanced Directive Interventions   Advanced Directives Discussed/Reviewed Advanced Directives Discussed, Provided resource for acquiring and filling out documents  [Living Will and Health Care Power of  Attorney to be mailed to daughter for review and completion]              SDOH assessments and interventions completed:  Yes  SDOH Interventions Today    Flowsheet Row Most Recent Value  SDOH Interventions   Food Insecurity Interventions Intervention Not Indicated  Housing Interventions Intervention Not Indicated  Transportation Interventions Intervention Not Indicated        Care Coordination Interventions:  Yes, provided   Follow up plan: Follow up call scheduled for 05/22/22    Encounter Outcome:  Pt. Visit Completed

## 2022-05-08 NOTE — Patient Outreach (Deleted)
  Care Coordination   Initial Visit Note   05/08/2022 Name: Lindsay Snyder MRN: [id number] DOB: May 29, 1945  Lindsay Snyder is a 77 y.o. year old female who sees Doreene Nest, NP for primary care. I spoke with  Lindsay Snyder daughter by phone today.  What matters to the patients health and wellness today?  In home care    Goals Addressed   None     SDOH assessments and interventions completed:  {yes/no:20286}{THN Tip this will not be part of the note when signed-REQUIRED REPORT FIELD DO NOT DELETE (Optional):27901}  SDOH Interventions Today    Flowsheet Row Most Recent Value  SDOH Interventions   Food Insecurity Interventions Intervention Not Indicated  Housing Interventions Intervention Not Indicated  Transportation Interventions Intervention Not Indicated        Care Coordination Interventions:  {INTERVENTIONS:27767} {THN Tip this will not be part of the note when signed-REQUIRED REPORT FIELD DO NOT DELETE (Optional):27901}  Follow up plan: {CCFOLLOWUP:27768}   Encounter Outcome:  {ENCOUTCOME:27770} {THN Tip this will not be part of the note when signed-REQUIRED REPORT FIELD DO NOT DELETE (Optional):27901}

## 2022-05-08 NOTE — Patient Instructions (Signed)
Visit Information  Thank you for taking time to visit with me today. Please don't hesitate to contact me if I can be of assistance to you.   Following are the goals we discussed today:   Goals Addressed             This Visit's Progress    In home care       Interventions Today    Flowsheet Row Most Recent Value  Chronic Disease   Chronic disease during today's visit Other  [Alzheimers]  General Interventions   General Interventions Discussed/Reviewed General Interventions Discussed, Community Resources, Level of Care  Level of Care Personal Care Services  [facility] Care Services form to be faxed to patient's provider for in home care assistance]  Education Interventions   Education Provided Provided Education  Provided Verbal Education On [facility]  [discussed personal care services that may be able to be obtained through [facility] if patient having full Medicaid can be confirmed]  Safety Interventions   Safety Discussed/Reviewed Safety Discussed  [confirmed that patient's son is at home with patient during the day. Car keys have been removed from patient's reach for safety]  Advanced Directive Interventions   Advanced Directives Discussed/Reviewed Advanced Directives Discussed, Provided resource for acquiring and filling out documents  [Living Will and Health Care Power of Attorney to be mailed to daughter for review and completion]              Our next appointment is by telephone on 05/22/22 at 3pm  Please call the care guide team at [phone] if you need to cancel or reschedule your appointment.   If you are experiencing a Mental Health or Behavioral Health Crisis or need someone to talk to, please call 911   Patient verbalizes understanding of instructions and care plan provided today and agrees to view in MyChart. Active MyChart status and patient understanding of how to access instructions and care plan via MyChart confirmed with patient.     Telephone  follow up appointment with care management team member scheduled for: 05/22/22  [name], LCSW Clinical Social Worker  [facility] Care Management [phone]

## 2022-05-15 ENCOUNTER — Encounter: Payer: Self-pay | Admitting: *Deleted

## 2022-05-15 NOTE — Patient Outreach (Signed)
  Care Coordination   Follow Up Visit Note   05/15/2022 Name: Lindsay Snyder MRN: [id number] DOB: 05-Jan-1946  Lindsay Snyder is a 77 y.o. year old female who sees Doreene Nest, NP for primary care. I  received the completed request form for Independent assessment for personal care services from the providers office-completed form faxed to [employer] for processing.  What matters to the patients health and wellness today? In home personal care services  Goals Addressed             This Visit's Progress    care coordination activities / resources.       Care Coordination Interventions: Patient's daughter to anticipate a call to schedule the initial assessment for personal care services          SDOH assessments and interventions completed:  No     Care Coordination Interventions:  Yes, provided  Interventions Today    Flowsheet Row Most Recent Value  General Interventions   General Interventions Discussed/Reviewed Communication with, Level of Care  Communication with --  [Communication by fax that completed request fom for ind. assessment for personal care services was received-form faxed to [employer] for processing]  Level of Care Personal Care Services  [facility] request form for personal care services faxed back to [employer]]       Follow up plan: Follow up call scheduled for 05/22/22 at 3pm    Encounter Outcome:  Pt. Visit Completed

## 2022-05-17 ENCOUNTER — Ambulatory Visit (INDEPENDENT_AMBULATORY_CARE_PROVIDER_SITE_OTHER): Payer: Medicare HMO

## 2022-05-17 VITALS — Ht 62.0 in | Wt 230.0 lb

## 2022-05-17 DIAGNOSIS — Z0000 Encounter for general adult medical examination without abnormal findings: Secondary | ICD-10-CM | POA: Diagnosis not present

## 2022-05-17 NOTE — Progress Notes (Signed)
I connected with  Lindsay Snyder and daughter [name] on 05/17/22 by a audio enabled telemedicine application and verified that I am speaking with the correct person using two identifiers.  Patient Location: Home  Provider Location: Office/Clinic  I discussed the limitations of evaluation and management by telemedicine. The patient expressed understanding and agreed to proceed.  Subjective:   Lindsay Snyder is a 77 y.o. female who presents for Medicare Annual (Subsequent) preventive examination.  Review of Systems      Cardiac Risk Factors include: advanced age (>70men, >22 women);sedentary lifestyle     Objective:    Today's Vitals   05/17/22 0936  Weight: 230 lb (104.3 kg)  Height: 5\' 2"  (1.575 m)   Body mass index is 42.07 kg/m.     05/17/2022    9:47 AM 02/04/2022    6:58 PM 02/02/2022    2:54 PM 01/14/2022    9:44 AM 06/05/2021    2:02 PM 01/02/2021   10:56 AM 10/20/2020    5:48 PM  Advanced Directives  Does Patient Have a Medical Advance Directive? Yes No No Yes No No No  Type of Estate agent of [place];Living will        Does patient want to make changes to medical advance directive?       Yes (MAU/Ambulatory/Procedural Areas - Information given)  Copy of Healthcare Power of Attorney in Chart? No - copy requested        Would patient like information on creating a medical advance directive?  No - Patient declined No - Patient declined  No - Patient declined      Current Medications (verified) Outpatient Encounter Medications as of 05/17/2022  Medication Sig   cetirizine (ZYRTEC) 10 MG tablet Take 10 mg by mouth daily.   donepezil (ARICEPT) 5 MG tablet Take 5 mg by mouth at bedtime.   DULoxetine (CYMBALTA) 20 MG capsule Take 2 capsules (40 mg total) by mouth daily. For anxiety   lithium carbonate 150 MG capsule Take 150 mg by mouth daily.   memantine (NAMENDA) 5 MG tablet Take 10 mg by mouth daily.   mirtazapine (REMERON) 15 MG tablet Take 15 mg  by mouth at bedtime.   oxybutynin (DITROPAN XL) 15 MG 24 hr tablet TAKE 1 TABLET(15 MG) BY MOUTH AT BEDTIME FOR OVERACTIVE BLADDER   traZODone (DESYREL) 100 MG tablet Take 100 mg by mouth at bedtime.   ondansetron (ZOFRAN-ODT) 4 MG disintegrating tablet Take 1 tablet (4 mg total) by mouth every 8 (eight) hours as needed for nausea or vomiting.   No facility-administered encounter medications on file as of 05/17/2022.    Allergies (verified) Crestor [rosuvastatin calcium] and Tylenol [acetaminophen]   History: Past Medical History:  Diagnosis Date   Allergy 010150   Anxiety 08/30/2019   Appendicitis 8    C. difficile diarrhea 02/04/2022   Cancer    daughter states patient never had cancer   Dementia    GERD (gastroesophageal reflux disease) 01/30/2019   Otitis externa 10/14/2020   Polyp of colon    Benign   Past Surgical History:  Procedure Laterality Date   ABDOMINAL HYSTERECTOMY     APPENDECTOMY     BREAST EXCISIONAL BIOPSY Right    + 20 years neg   COLONOSCOPY WITH PROPOFOL N/A 12/16/2017   Procedure: COLONOSCOPY WITH PROPOFOL;  Surgeon: Wyline Mood, MD;  Location: [facility] ENDOSCOPY;  Service: Gastroenterology;  Laterality: N/A;   COLONOSCOPY WITH PROPOFOL N/A 01/02/2021   Procedure: COLONOSCOPY  WITH PROPOFOL;  Surgeon: Wyline Mood, MD;  Location: [facility] ENDOSCOPY;  Service: Gastroenterology;  Laterality: N/A;   Family History  Problem Relation Age of Onset   Diabetes Mother        Deceased   Heart attack Father 25       Deceased   Cancer Father        Colon   Breast cancer Paternal Aunt    Social History   Socioeconomic History   Marital status: Legally Separated    Spouse name: Not on file   Number of children: Not on file   Years of education: Not on file   Highest education level: Not on file  Occupational History   Not on file  Tobacco Use   Smoking status: Never   Smokeless tobacco: Never  Vaping Use   Vaping Use: Never used  Substance and Sexual  Activity   Alcohol use: No   Drug use: Never   Sexual activity: Not Currently  Other Topics Concern   Not on file  Social History Narrative   From [place], moved to [place] for her ill daughter, moved to [place], moved back to [place].   Retired.   Lives in [place].   Enjoys babysitting, writing, reading, cooking.      Social Determinants of Health   Financial Resource Strain: Low Risk  (05/17/2022)   Overall Financial Resource Strain (CARDIA)    Difficulty of Paying Living Expenses: Not hard at all  Food Insecurity: No Food Insecurity (05/17/2022)   Hunger Vital Sign    Worried About Running Out of Food in the Last Year: Never true    Ran Out of Food in the Last Year: Never true  Transportation Needs: No Transportation Needs (05/17/2022)   PRAPARE - Administrator, Civil Service (Medical): No    Lack of Transportation (Non-Medical): No  Physical Activity: Insufficiently Active (05/17/2022)   Exercise Vital Sign    Days of Exercise per Week: 7 days    Minutes of Exercise per Session: 20 min  Stress: No Stress Concern Present (05/17/2022)   [employer] of Occupational Health - Occupational Stress Questionnaire    Feeling of Stress : Not at all  Social Connections: Moderately Isolated (05/17/2022)   Social Connection and Isolation Panel [NHANES]    Frequency of Communication with Friends and Family: More than three times a week    Frequency of Social Gatherings with Friends and Family: More than three times a week    Attends Religious Services: More than 4 times per year    Active Member of [employer] or Organizations: No    Attends Banker Meetings: Never    Marital Status: Separated    Tobacco Counseling Counseling given: Not Answered   Clinical Intake:  Pre-visit preparation completed: Yes  Pain : No/denies pain     Nutritional Risks: None Diabetes: No  How often do you need to have someone help you when you read instructions, pamphlets, or  other written materials from your doctor or pharmacy?: 1 - Never  Diabetic? no  Interpreter Needed?: No  Information entered by :: C.Honore Wipperfurth LPN   Activities of Daily Living    05/17/2022    9:50 AM 05/16/2022    9:23 AM  In your present state of health, do you have any difficulty performing the following activities:  Hearing? 0 0  Vision? 0 0  Difficulty concentrating or making decisions? 1 1  Comment Occasionally   Walking or climbing  stairs? 0 1  Dressing or bathing? 1 1  Comment daughter assists   Doing errands, shopping? 1 0  Comment daughter assists   Preparing Food and eating ? N Y  Using the Toilet? N Y  In the past six months, have you accidently leaked urine? Malvin Johns  Comment Wears depends   Do you have problems with loss of bowel control? N N  Managing your Medications? Y Y  Comment Daughter assists   Managing your Finances? Malvin Johns  Comment Daughter assists   Housekeeping or managing your Housekeeping? Malvin Johns  Comment Daughter assists     Patient Care Team: Doreene Nest, NP as PCP - General (Nurse Practitioner)  Indicate any recent Medical Services you may have received from other than [facility] providers in the past year (date may be approximate).     Assessment:   This is a routine wellness examination for Lus.  Hearing/Vision screen Hearing Screening - Comments:: No aid Vision Screening - Comments:: Readers - [facility]  Dietary issues and exercise activities discussed:     Goals Addressed             This Visit's Progress    Patient Stated       No falls.       Depression Screen    05/17/2022    9:49 AM 03/01/2022    9:17 AM 10/20/2020    6:09 PM 12/25/2019   10:23 AM 07/21/2018    9:31 AM 07/17/2018   10:24 AM 07/08/2017   12:16 PM  PHQ 2/9 Scores  PHQ - 2 Score 0 4 0 6 2 0 0  PHQ- 9 Score  0 0    Fall Risk    05/17/2022    9:45 AM 05/16/2022    9:23 AM 03/01/2022    9:17 AM 02/09/2022   12:06 PM 10/20/2020    6:13 PM  Fall Risk    Falls in the past year? 0  Comment Went through PT      Number falls in past yr: 0  Injury with Fall? 0 0  Risk for fall due to : History of fall(s);Impaired balance/gait;Orthopedic patient  History of fall(s) Impaired balance/gait;History of fall(s) History of fall(s)  Follow up Education provided;Falls prevention discussed;Falls evaluation completed  Falls evaluation completed Falls evaluation completed     FALL RISK PREVENTION PERTAINING TO THE HOME:  Any stairs in or around the home? Yes  If so, are there any without handrails? Yes  Home free of loose throw rugs in walkways, pet beds, electrical cords, etc? Yes  Adequate lighting in your home to reduce risk of falls? Yes   ASSISTIVE DEVICES UTILIZED TO PREVENT FALLS:  Life alert? No  Use of a cane, walker or w/c? No  Grab bars in the bathroom? Yes  Shower chair or bench in shower? Yes  Elevated toilet seat or a handicapped toilet? No    Cognitive Function:    07/05/2020    8:36 AM 07/17/2018   10:25 AM 07/08/2017   12:39 PM 06/29/2016   11:56 AM  MMSE - Mini Mental State Exam  Orientation to time Orientation to Place Registration Attention/ Calculation 0 0 0 0  Recall Language- name 2 objects 2 0 0 0  Language- repeat 0 1 1  1  Language- follow 3 step command 3 0 3 3  Language- read & follow direction 1 0 0 0  Write a sentence 1 0 0 0  Copy design 1 0 0 0  Total score 22 17 19 20         05/17/2022    9:52 AM 10/20/2020    6:14 PM  6CIT Screen  What Year? 4 points 0 points  What month? 3 points 0 points  What time? 3 points 0 points  Count back from 20 4 points 4 points  Months in reverse 0 points 4 points  Repeat phrase 10 points 10 points  Total Score 24 points 18 points    Immunizations Immunization History  Administered Date(s) Administered   Fluad Quad(high Dose 65+) 12/25/2019, 10/14/2020, 01/05/2022   PFIZER(Purple Top)SARS-COV-2 Vaccination  08/18/2019, 09/08/2019, 03/24/2020   Pneumococcal Conjugate-13 05/18/2015   Pneumococcal Polysaccharide-23 08/04/2012   Tdap 05/17/2014    TDAP status: Up to date  Flu Vaccine status: Up to date  Pneumococcal vaccine status: Up to date  Covid-19 vaccine status: Information provided on how to obtain vaccines.   Qualifies for Shingles Vaccine? Yes   Zostavax completed No   Shingrix Completed?: No.    Education has been provided regarding the importance of this vaccine. Patient has been advised to call insurance company to determine out of pocket expense if they have not yet received this vaccine. Advised may also receive vaccine at local pharmacy or Health Dept. Verbalized acceptance and understanding.  Screening Tests Health Maintenance  Topic Date Due   Zoster Vaccines- Shingrix (1 of 2) Never done   INFLUENZA VACCINE  08/30/2022   Medicare Annual Wellness (AWV)  05/17/2023   COLONOSCOPY (Pts 45-9yrs Insurance coverage will need to be confirmed)  01/03/2024   DTaP/Tdap/Td (2 - Td or Tdap) 05/16/2024   Pneumonia Vaccine 83+ Years old  Completed   DEXA SCAN  Completed   Hepatitis C Screening  Completed   HPV VACCINES  Aged Out   COVID-19 Vaccine  Discontinued    Health Maintenance  Health Maintenance Due  Topic Date Due   Zoster Vaccines- Shingrix (1 of 2) Never done    Colorectal cancer screening: Type of screening: Colonoscopy. Completed 01/02/21. Repeat every 3 years will discuss with PCP.   Mammogram status: Completed 10/12/2021. Repeat every year  Bone Density status: Completed 07/22/2017. Results reflect: Bone density results: OSTEOPENIA. Repeat every 2 years. Will do at same time as mammogram.  Lung Cancer Screening: (Low Dose CT Chest recommended if Age 44-80 years, 30 pack-year currently smoking OR have quit w/in 15years.) does not qualify.   Lung Cancer Screening Referral: no  Additional Screening:  Hepatitis C Screening: does qualify; Completed  06/29/16  Vision Screening: Recommended annual ophthalmology exams for early detection of glaucoma and other disorders of the eye. Is the patient up to date with their annual eye exam?  No  Who is the provider or what is the name of the office in which the patient attends annual eye exams? [facility], pt will call for appointment If pt is not established with a provider, would they like to be referred to a provider to establish care? No .   Dental Screening: Recommended annual dental exams for proper oral hygiene  Community Resource Referral / Chronic Care Management: CRR required this visit?  No   CCM required this visit?  No      Plan:     I have personally reviewed and noted the  following in the patient's chart:   Medical and social history Use of alcohol, tobacco or illicit drugs  Current medications and supplements including opioid prescriptions. Patient is not currently taking opioid prescriptions. Functional ability and status Nutritional status Physical activity Advanced directives List of other physicians Hospitalizations, surgeries, and ER visits in previous 12 months Vitals Screenings to include cognitive, depression, and falls Referrals and appointments  In addition, I have reviewed and discussed with patient certain preventive protocols, quality metrics, and best practice recommendations. A written personalized care plan for preventive services as well as general preventive health recommendations were provided to patient.     Maryan Puls, LPN   1/61/0960   Nurse Notes: 6 CIT score 24

## 2022-05-17 NOTE — Patient Instructions (Signed)
Lindsay Snyder , Thank you for taking time to come for your Medicare Wellness Visit. I appreciate your ongoing commitment to your health goals. Please review the following plan we discussed and let me know if I can assist you in the future.   These are the goals we discussed:  Goals      care coordination activities / resources.     Care Coordination Interventions: Patient's daughter to anticipate a call to schedule the initial assessment for personal care services       In home care     Interventions Today    Flowsheet Row Most Recent Value  Chronic Disease   Chronic disease during today's visit Other  [Alzheimers]  General Interventions   General Interventions Discussed/Reviewed General Interventions Discussed, Community Resources, Level of Care  Level of Care Personal Care Services  [facility] Care Services form to be faxed to patient's provider for in home care assistance]  Education Interventions   Education Provided Provided Education  Provided Verbal Education On [employer]  [discussed personal care services that may be able to be obtained through [facility] if patient having full Medicaid can be confirmed]  Safety Interventions   Safety Discussed/Reviewed Safety Discussed  [confirmed that patient's son is at home with patient during the day. Car keys have been removed from patient's reach for safety]  Advanced Directive Interventions   Advanced Directives Discussed/Reviewed Advanced Directives Discussed, Provided resource for acquiring and filling out documents  [Living Will and Health Care Power of Attorney to be mailed to daughter for review and completion]           Patient Stated     Starting 07/17/2018, I will continue to take medications as prescribed.      Patient Stated     No falls.        This is a list of the screening recommended for you and due dates:  Health Maintenance  Topic Date Due   Zoster (Shingles) Vaccine (1 of 2) Never done   Flu Shot   08/30/2022   Medicare Annual Wellness Visit  05/17/2023   Colon Cancer Screening  01/03/2024   DTaP/Tdap/Td vaccine (2 - Td or Tdap) 05/16/2024   Pneumonia Vaccine  Completed   DEXA scan (bone density measurement)  Completed   Hepatitis C Screening: USPSTF Recommendation to screen - Ages 23-79 yo.  Completed   HPV Vaccine  Aged Out   COVID-19 Vaccine  Discontinued    Advanced directives: none  Conditions/risks identified: Aim for 30 minutes of exercise or brisk walking, 6-8 glasses of water, and 5 servings of fruits and vegetables each day.   Next appointment: Follow up in one year for your annual wellness visit 05/21/23 @ 9:15 televisit   Preventive Care 65 Years and Older, Female Preventive care refers to lifestyle choices and visits with your health care provider that can promote health and wellness. What does preventive care include? A yearly physical exam. This is also called an annual well check. Dental exams once or twice a year. Routine eye exams. Ask your health care provider how often you should have your eyes checked. Personal lifestyle choices, including: Daily care of your teeth and gums. Regular physical activity. Eating a healthy diet. Avoiding tobacco and drug use. Limiting alcohol use. Practicing safe sex. Taking low-dose aspirin every day. Taking vitamin and mineral supplements as recommended by your health care provider. What happens during an annual well check? The services and screenings done by your health care provider during  your annual well check will depend on your age, overall health, lifestyle risk factors, and family history of disease. Counseling  Your health care provider may ask you questions about your: Alcohol use. Tobacco use. Drug use. Emotional well-being. Home and relationship well-being. Sexual activity. Eating habits. History of falls. Memory and ability to understand (cognition). Work and work Astronomer. Reproductive  health. Screening  You may have the following tests or measurements: Height, weight, and BMI. Blood pressure. Lipid and cholesterol levels. These may be checked every 5 years, or more frequently if you are over 61 years old. Skin check. Lung cancer screening. You may have this screening every year starting at age 24 if you have a 30-pack-year history of smoking and currently smoke or have quit within the past 15 years. Fecal occult blood test (FOBT) of the stool. You may have this test every year starting at age 28. Flexible sigmoidoscopy or colonoscopy. You may have a sigmoidoscopy every 5 years or a colonoscopy every 10 years starting at age 10. Hepatitis C blood test. Hepatitis B blood test. Sexually transmitted disease (STD) testing. Diabetes screening. This is done by checking your blood sugar (glucose) after you have not eaten for a while (fasting). You may have this done every 1-3 years. Bone density scan. This is done to screen for osteoporosis. You may have this done starting at age 74. Mammogram. This may be done every 1-2 years. Talk to your health care provider about how often you should have regular mammograms. Talk with your health care provider about your test results, treatment options, and if necessary, the need for more tests. Vaccines  Your health care provider may recommend certain vaccines, such as: Influenza vaccine. This is recommended every year. Tetanus, diphtheria, and acellular pertussis (Tdap, Td) vaccine. You may need a Td booster every 10 years. Zoster vaccine. You may need this after age 88. Pneumococcal 13-valent conjugate (PCV13) vaccine. One dose is recommended after age 7. Pneumococcal polysaccharide (PPSV23) vaccine. One dose is recommended after age 67. Talk to your health care provider about which screenings and vaccines you need and how often you need them. This information is not intended to replace advice given to you by your health care provider.  Make sure you discuss any questions you have with your health care provider. Document Released: 02/11/2015 Document Revised: 10/05/2015 Document Reviewed: 11/16/2014 Elsevier Interactive Patient Education  2017 [employer].  Fall Prevention in the Home Falls can cause injuries. They can happen to people of all ages. There are many things you can do to make your home safe and to help prevent falls. What can I do on the outside of my home? Regularly fix the edges of walkways and driveways and fix any cracks. Remove anything that might make you trip as you walk through a door, such as a raised step or threshold. Trim any bushes or trees on the path to your home. Use bright outdoor lighting. Clear any walking paths of anything that might make someone trip, such as rocks or tools. Regularly check to see if handrails are loose or broken. Make sure that both sides of any steps have handrails. Any raised decks and porches should have guardrails on the edges. Have any leaves, snow, or ice cleared regularly. Use sand or salt on walking paths during winter. Clean up any spills in your garage right away. This includes oil or grease spills. What can I do in the bathroom? Use night lights. Install grab bars by the toilet and  in the tub and shower. Do not use towel bars as grab bars. Use non-skid mats or decals in the tub or shower. If you need to sit down in the shower, use a plastic, non-slip stool. Keep the floor dry. Clean up any water that spills on the floor as soon as it happens. Remove soap buildup in the tub or shower regularly. Attach bath mats securely with double-sided non-slip rug tape. Do not have throw rugs and other things on the floor that can make you trip. What can I do in the bedroom? Use night lights. Make sure that you have a light by your bed that is easy to reach. Do not use any sheets or blankets that are too big for your bed. They should not hang down onto the floor. Have a  firm chair that has side arms. You can use this for support while you get dressed. Do not have throw rugs and other things on the floor that can make you trip. What can I do in the kitchen? Clean up any spills right away. Avoid walking on wet floors. Keep items that you use a lot in easy-to-reach places. If you need to reach something above you, use a strong step stool that has a grab bar. Keep electrical cords out of the way. Do not use floor polish or wax that makes floors slippery. If you must use wax, use non-skid floor wax. Do not have throw rugs and other things on the floor that can make you trip. What can I do with my stairs? Do not leave any items on the stairs. Make sure that there are handrails on both sides of the stairs and use them. Fix handrails that are broken or loose. Make sure that handrails are as long as the stairways. Check any carpeting to make sure that it is firmly attached to the stairs. Fix any carpet that is loose or worn. Avoid having throw rugs at the top or bottom of the stairs. If you do have throw rugs, attach them to the floor with carpet tape. Make sure that you have a light switch at the top of the stairs and the bottom of the stairs. If you do not have them, ask someone to add them for you. What else can I do to help prevent falls? Wear shoes that: Do not have high heels. Have rubber bottoms. Are comfortable and fit you well. Are closed at the toe. Do not wear sandals. If you use a stepladder: Make sure that it is fully opened. Do not climb a closed stepladder. Make sure that both sides of the stepladder are locked into place. Ask someone to hold it for you, if possible. Clearly mark and make sure that you can see: Any grab bars or handrails. First and last steps. Where the edge of each step is. Use tools that help you move around (mobility aids) if they are needed. These include: Canes. Walkers. Scooters. Crutches. Turn on the lights when you  go into a dark area. Replace any light bulbs as soon as they burn out. Set up your furniture so you have a clear path. Avoid moving your furniture around. If any of your floors are uneven, fix them. If there are any pets around you, be aware of where they are. Review your medicines with your doctor. Some medicines can make you feel dizzy. This can increase your chance of falling. Ask your doctor what other things that you can do to help prevent falls. This  information is not intended to replace advice given to you by your health care provider. Make sure you discuss any questions you have with your health care provider. Document Released: 11/11/2008 Document Revised: 06/23/2015 Document Reviewed: 02/19/2014 Elsevier Interactive Patient Education  2017 [employer].

## 2022-05-22 ENCOUNTER — Ambulatory Visit: Payer: Self-pay | Admitting: *Deleted

## 2022-05-22 ENCOUNTER — Encounter: Payer: Medicare HMO | Admitting: *Deleted

## 2022-05-22 NOTE — Patient Instructions (Signed)
Visit Information  Thank you for taking time to visit with me today. Please don't hesitate to contact me if I can be of assistance to you.   Following are the goals we discussed today:   Goals Addressed             This Visit's Progress    care coordination activities / resources.       Activities and task to complete in order to accomplish goals.   Contact [facility] to check status of Request for Independent Assessment for Personal Care Services [phone]              Our next appointment is by telephone on 05/29/22 at 3pm  Please call the care guide team at [phone] if you need to cancel or reschedule your appointment.   If you are experiencing a Mental Health or Behavioral Health Crisis or need someone to talk to, please call 911   Patient verbalizes understanding of instructions and care plan provided today and agrees to view in MyChart. Active MyChart status and patient understanding of how to access instructions and care plan via MyChart confirmed with patient.     Telephone follow up appointment with care management team member scheduled for: 05/29/22  [name], LCSW Clinical Social Worker  [facility] Care Management [phone]

## 2022-05-22 NOTE — Patient Outreach (Signed)
  Care Coordination   Follow Up Visit Note   05/22/2022 Name: Lindsay Snyder MRN: [id number] DOB: [birth year]  Lindsay Snyder is a 77 y.o. year old female who sees Doreene Nest, NP for primary care. I spoke with  Lindsay Snyder daughter Lindsay Snyder by phone today.  What matters to the patients health and wellness today?  In home care resources. Request for independent assessment for personal care services completed by patient's provider. Patient's daughter called to check status and was told that she will need to be listed as alternate contact, diagnosis section and ADL section will need to be competed as well. Provider to be notified.   Goals Addressed             This Visit's Progress    care coordination activities / resources.       Activities and task to complete in order to accomplish goals.   Contact [facility] to check status of Request for Independent Assessment for Personal Care Services [phone]              SDOH assessments and interventions completed:  No     Care Coordination Interventions:  Yes, provided  Interventions Today    Flowsheet Row Most Recent Value  Chronic Disease   Chronic disease during today's visit Other  [Alzheimers]  General Interventions   General Interventions Discussed/Reviewed General Interventions Reviewed, Community Resources, Level of Care  Level of Care Personal Care Services  [Confirmed that patient continues to have diffculty completing her ADL's independently-PCS form faxed to [facility]-patient's daughter provided with contact number to schedule initial assessment [phone]]       Follow up plan: Follow up call scheduled for 05/29/22    Encounter Outcome:  Pt. Visit Completed

## 2022-05-29 ENCOUNTER — Encounter: Payer: Self-pay | Admitting: *Deleted

## 2022-05-29 NOTE — Patient Outreach (Addendum)
  Care Coordination   Care Coordination  Visit Note   05/29/2022 Name: Lindsay Snyder MRN: [id number] DOB: 11-15-1945  RAFEEF Snyder is a 77 y.o. year old female who sees Doreene Nest, NP for primary care. I  spoke with [facility] to confirm that corrected request form was received, however [employer] ADL's was not completed. Form to be refaxed once completed  What matters to the patients health and wellness today?   In home care services.     Goals Addressed             This Visit's Progress    care coordination activities / resources.       Activities and task to complete in order to accomplish goals.   Contact [facility] to check status of Request for Independent Assessment for Personal Care Services once corrected form received [phone]               SDOH assessments and interventions completed:  No     Care Coordination Interventions:  Yes, provided  Interventions Today    Flowsheet Row Most Recent Value  General Interventions   General Interventions Discussed/Reviewed Communication with  Level of Care Personal Care Services  [facility]-comfirmed that the request form for independent assessment for PCS was received but missing Impacts on ADL's-form to be re-faxed once completed]       Follow up plan: Follow up call scheduled for 06/04/22    Encounter Outcome:  Pt. Visit Completed

## 2022-06-04 ENCOUNTER — Ambulatory Visit: Payer: Self-pay | Admitting: *Deleted

## 2022-06-04 ENCOUNTER — Ambulatory Visit: Payer: Self-pay

## 2022-06-04 NOTE — Patient Outreach (Signed)
  Care Coordination   Follow Up Visit Note   06/04/2022 Name: Lindsay Snyder MRN: [id number] DOB: Aug 20, 1945  Lindsay Snyder is a 77 y.o. year old female who sees Doreene Nest, NP for primary care. I spoke with  Lorna Dibble daughter by phone today.  What matters to the patients health and wellness today?  In home assistance    Goals Addressed             This Visit's Progress    In home care       Interventions Today    Flowsheet Row Most Recent Value  General Interventions   Communication with --  [[facility]]  Level of Care Personal Care Services  [Phone call to [facility]-confirmed that the request form for personal care services has been received-in home assessment ready to be scheduled-daughter agreeable to call to schedule]              SDOH assessments and interventions completed:  No     Care Coordination Interventions:  Yes, provided   Follow up plan: Follow up call scheduled for 06/18/22    Encounter Outcome:  Pt. Visit Completed

## 2022-06-04 NOTE — Patient Outreach (Signed)
  Care Coordination   Follow Up Visit Note   06/04/2022 Name: Lindsay Snyder MRN: [id number] DOB: 06-07-1945  Lindsay Snyder is a 77 y.o. year old female who sees Doreene Nest, NP for primary care. I  spoke with patients daughter, Lindsay Snyder  What matters to the patients health and wellness today?  Daughter states she continues working with Child psychotherapist for personal care services for patient. Daughter denies patient having any nursing needs at this time.  No further follow up needed by [facility].    Goals Addressed             This Visit's Progress    care coordination activities / resources.       Interventions Today    Flowsheet Row Most Recent Value  Chronic Disease   Chronic disease during today's visit Other  [alzheimers]  General Interventions   General Interventions Discussed/Reviewed General Interventions Reviewed  Pearletha Furl for nursing needs. Advised to call RNCM / primary care provider if care coordination services needed in the future.]                   SDOH assessments and interventions completed:  No     Care Coordination Interventions:  Yes, provided   Follow up plan: Follow up call scheduled for      Encounter Outcome:  Pt. Visit Completed   Shunna Busam RN,BSN,CCM [facility] Care Coordination [phone] direct line

## 2022-06-04 NOTE — Patient Instructions (Signed)
Visit Information  Thank you for taking time to visit with me today. Please don't hesitate to contact me if I can be of assistance to you.   Following are the goals we discussed today:   Goals Addressed             This Visit's Progress    In home care       Interventions Today    Flowsheet Row Most Recent Value  General Interventions   Communication with --  [[facility]]  Level of Care Personal Care Services  [Phone call to [facility]-confirmed that the request form for personal care services has been received-in home assessment ready to be scheduled-daughter agreeable to call to schedule]              Our next appointment is by telephone on 06/18/22 at 3pm  Please call the care guide team at [phone] if you need to cancel or reschedule your appointment.   If you are experiencing a Mental Health or Behavioral Health Crisis or need someone to talk to, please call 911   Patient verbalizes understanding of instructions and care plan provided today and agrees to view in MyChart. Active MyChart status and patient understanding of how to access instructions and care plan via MyChart confirmed with patient.     Telephone follow up appointment with care management team member scheduled for: 06/18/22   [name], LCSW Clinical Social Worker  [facility] Care Management [phone]

## 2022-06-18 ENCOUNTER — Ambulatory Visit: Payer: Self-pay | Admitting: *Deleted

## 2022-06-18 ENCOUNTER — Telehealth: Payer: Self-pay | Admitting: *Deleted

## 2022-06-18 NOTE — Patient Instructions (Signed)
Visit Information  Thank you for taking time to visit with me today. Please don't hesitate to contact me if I can be of assistance to you.   Following are the goals we discussed today:   Goals Addressed             This Visit's Progress    care coordination activities / resources.       Interventions Today    Flowsheet Row Most Recent Value  Chronic Disease   Chronic disease during today's visit Other  [alzheimers]  General Interventions   General Interventions Discussed/Reviewed General Interventions Reviewed, Level of Care  Level of Care Personal Care Services  [assessment for personal care services scheduled for 07/02/22]  Safety Interventions   Safety Discussed/Reviewed Home Safety  Home Safety Refer for community resources  [referred for personal care services]                   Our next appointment is by telephone on 07/03/22 at 3pm  Please call the care guide team at [phone] if you need to cancel or reschedule your appointment.   If you are experiencing a Mental Health or Behavioral Health Crisis or need someone to talk to, please call 911   Patient verbalizes understanding of instructions and care plan provided today and agrees to view in MyChart. Active MyChart status and patient understanding of how to access instructions and care plan via MyChart confirmed with patient.     Telephone follow up appointment with care management team member scheduled for: 07/03/22  [name], LCSW Clinical Social Worker  [facility] Care Management [phone]

## 2022-06-18 NOTE — Patient Outreach (Signed)
  Care Coordination   06/18/2022 Name: Lindsay Snyder MRN: [id number] DOB: [birth year]   Care Coordination Outreach Attempts:  An unsuccessful telephone outreach was attempted today to offer the patient information about available care coordination services.  Follow Up Plan:  Additional outreach attempts will be made to offer the patient care coordination information and services.   Encounter Outcome:  No Answer   Care Coordination Interventions:  No, not indicated    Garland Smouse, LCSW Clinical Social Worker  [facility] Care Management [phone]

## 2022-06-18 NOTE — Patient Outreach (Signed)
  Care Coordination   Follow Up Visit Note   06/18/2022 Name: Lindsay Snyder MRN: [id number] DOB: 10/14/1945  Lindsay Snyder is a 77 y.o. year old female who sees Doreene Nest, NP for primary care. I spoke with  Lindsay Snyder by phone today.  What matters to the patients health and wellness today?  In home care for patient.    Goals Addressed             This Visit's Progress    care coordination activities / resources.       Interventions Today    Flowsheet Row Most Recent Value  Chronic Disease   Chronic disease during today's visit Other  [alzheimers]  General Interventions   General Interventions Discussed/Reviewed General Interventions Reviewed, Level of Care  Level of Care Personal Care Services  [assessment for personal care services scheduled for 07/02/22]  Safety Interventions   Safety Discussed/Reviewed Home Safety  Home Safety Refer for community resources  [referred for personal care services]                   SDOH assessments and interventions completed:  Yes     Care Coordination Interventions:  Yes, provided   Follow up plan: Follow up call scheduled for 07/03/22    Encounter Outcome:  Pt. Visit Completed

## 2022-07-03 ENCOUNTER — Encounter: Payer: Self-pay | Admitting: *Deleted

## 2022-07-04 ENCOUNTER — Ambulatory Visit: Payer: Self-pay | Admitting: *Deleted

## 2022-07-04 NOTE — Patient Outreach (Signed)
  Care Coordination   Follow Up Visit Note   07/04/2022 Name: DELPHINE CRYDER MRN: [id number] DOB: 02/10/1945  BONA RAVI is a 77 y.o. year old female who sees Doreene Nest, NP for primary care. I spoke with  Lorna Dibble daughter by phone today.  What matters to the patients health and wellness today?  Patient's daughter requesting assistance with in home care.  Nursing assessment completed through [facility] for in home care on Monday. Daughter has chosen 3 agencies and is now waiting on written confirmation that she has been approved as well as hours provided.  Goals Addressed             This Visit's Progress    In home care       Interventions Today    Flowsheet Row Most Recent Value  Chronic Disease   Chronic disease during today's visit Other  [alzheimers]  General Interventions   General Interventions Discussed/Reviewed General Interventions Reviewed, Community Resources  Level of Care Personal Care Services  [patient's daughter confirmed that patient has completed the nursing assessment on Monday, has chosen 3 agencies for care and is now waiting on confirmation that she has officially been approved and how may hours will be provided]              SDOH assessments and interventions completed:  No     Care Coordination Interventions:  Yes, provided   Follow up plan: Follow up call scheduled for 07/18/22    Encounter Outcome:  Pt. Visit Completed

## 2022-07-04 NOTE — Patient Instructions (Signed)
Visit Information  Thank you for taking time to visit with me today. Please don't hesitate to contact me if I can be of assistance to you.   Following are the goals we discussed today:  Please continue to follow up with [facility] regarding approval and hours given for personal care services.   Our next appointment is by telephone on 07/18/22 at 3pm  Please call the care guide team at [phone] if you need to cancel or reschedule your appointment.   If you are experiencing a Mental Health or Behavioral Health Crisis or need someone to talk to, please call 911   Patient verbalizes understanding of instructions and care plan provided today and agrees to view in MyChart. Active MyChart status and patient understanding of how to access instructions and care plan via MyChart confirmed with patient.     Telephone follow up appointment with care management team member scheduled for: 07/18/22  [name], LCSW Clinical Social Worker  [facility] Care Management [phone]

## 2022-07-09 ENCOUNTER — Telehealth: Payer: Self-pay | Admitting: *Deleted

## 2022-07-09 ENCOUNTER — Ambulatory Visit: Payer: Medicare HMO | Admitting: Podiatry

## 2022-07-10 ENCOUNTER — Other Ambulatory Visit: Payer: Self-pay | Admitting: Primary Care

## 2022-07-10 DIAGNOSIS — R32 Unspecified urinary incontinence: Secondary | ICD-10-CM

## 2022-07-10 MED ORDER — OXYBUTYNIN CHLORIDE ER 15 MG PO TB24: ORAL_TABLET | ORAL | 1 refills | Status: DC

## 2022-07-10 NOTE — Telephone Encounter (Signed)
From: Oletta Cohn To: Office of Doreene Nest, NP Sent: 07/09/2022 8:14 PM EDT Subject: Medication Renewal Request  Refills have been requested for the following medications:   oxybutynin (DITROPAN XL) 15 MG 24 hr tablet [Jatia Musa K Cally Nygard]  Preferred pharmacy: [id number] Nicholes Rough, [place] - [address] Delivery method: Pickup  This message is being sent by Idelle Leech on behalf of MAGEN SURIANO

## 2022-07-10 NOTE — Patient Instructions (Signed)
Visit Information  Thank you for taking time to visit with me today. Please don't hesitate to contact me if I can be of assistance to you.   Following are the goals we discussed today:  Patient's daughter to continue to consider all care options for patient and will call this social worker back with decision made related to personal care services vs. long term care   Please call the care guide team at [phone] if you need to cancel or reschedule your appointment.   If you are experiencing a Mental Health or Behavioral Health Crisis or need someone to talk to, please call 911   Patient verbalizes understanding of instructions and care plan provided today and agrees to view in MyChart. Active MyChart status and patient understanding of how to access instructions and care plan via MyChart confirmed with patient.     No further follow up required: Patient's daughter to contact this Child psychotherapist with any needs related to facility care.  [name], LCSW Clinical Social Worker  [facility] Care Management [phone]

## 2022-07-10 NOTE — Patient Outreach (Signed)
  Care Coordination   Follow Up Visit Note   07/10/2022 Late Entry Name: NARCISSUS DETWILER MRN: [id number] DOB: [birth year]  VEORA FONTE is a 77 y.o. year old female who sees Doreene Nest, NP for primary care. I spoke with  Lorna Dibble daughter by phone on 07/09/22  What matters to the patients health and wellness today?  Patient approved for personal care services, however due to patient's medical condition progressing, patient's daughter considering placement in long term care.     Goals Addressed             This Visit's Progress    personal care service needs       Interventions Today    Flowsheet Row Most Recent Value  Chronic Disease   Chronic disease during today's visit Other  [Alzheimers]  General Interventions   General Interventions Discussed/Reviewed General Interventions Reviewed  Level of Care Personal Care Services, Skilled Nursing Facility  [confirmed that pt approved for in home care, however daughter now considering a higher level of care for pt. Placement process discussed, including need for ltc medicaid, anticipated timeline, and limis related to bed availability]                   SDOH assessments and interventions completed:  No     Care Coordination Interventions:  Yes, provided   Follow up plan:  Patient's daughter will contact this social worker once decision is made regarding patient's long term care needs    Encounter Outcome:  Pt. Visit Completed

## 2022-07-11 ENCOUNTER — Ambulatory Visit: Payer: Medicare HMO | Admitting: Podiatry

## 2022-07-16 ENCOUNTER — Encounter: Payer: Self-pay | Admitting: Podiatry

## 2022-07-16 ENCOUNTER — Ambulatory Visit (INDEPENDENT_AMBULATORY_CARE_PROVIDER_SITE_OTHER): Payer: Medicare HMO | Admitting: Podiatry

## 2022-07-16 VITALS — BP 157/62

## 2022-07-16 DIAGNOSIS — M79675 Pain in left toe(s): Secondary | ICD-10-CM

## 2022-07-16 DIAGNOSIS — M79674 Pain in right toe(s): Secondary | ICD-10-CM | POA: Diagnosis not present

## 2022-07-16 DIAGNOSIS — B351 Tinea unguium: Secondary | ICD-10-CM | POA: Diagnosis not present

## 2022-07-18 ENCOUNTER — Telehealth: Payer: Self-pay | Admitting: *Deleted

## 2022-07-18 ENCOUNTER — Encounter: Payer: Self-pay | Admitting: *Deleted

## 2022-07-18 NOTE — Patient Outreach (Signed)
  Care Coordination   07/18/2022 Name: DALIS BROKER MRN: [id number] DOB: 1945-12-08   Care Coordination Outreach Attempts:  An unsuccessful telephone outreach was attempted today to offer the patient information about available care coordination services.  Follow Up Plan:  Additional outreach attempts will be made to offer the patient care coordination information and services.   Encounter Outcome:  No Answer   Care Coordination Interventions:  No, not indicated     Yohan Samons, LCSW Clinical Social Worker  [facility] Care Management [phone]

## 2022-07-19 DIAGNOSIS — F419 Anxiety disorder, unspecified: Secondary | ICD-10-CM | POA: Diagnosis not present

## 2022-07-19 DIAGNOSIS — G3184 Mild cognitive impairment, so stated: Secondary | ICD-10-CM | POA: Diagnosis not present

## 2022-07-19 DIAGNOSIS — F5102 Adjustment insomnia: Secondary | ICD-10-CM | POA: Diagnosis not present

## 2022-07-22 ENCOUNTER — Encounter: Payer: Self-pay | Admitting: Podiatry

## 2022-07-22 DIAGNOSIS — N898 Other specified noninflammatory disorders of vagina: Secondary | ICD-10-CM

## 2022-07-22 DIAGNOSIS — N390 Urinary tract infection, site not specified: Secondary | ICD-10-CM

## 2022-07-22 NOTE — Progress Notes (Signed)
  Subjective:  Patient ID: Lindsay Snyder, female    DOB: 1945/07/21,  MRN: [id number]  Lindsay Snyder presents to clinic today for: painful elongated mycotic toenails 1-5 bilaterally which are tender when wearing enclosed shoe gear. Pain is relieved with periodic professional debridement.  She is accompanied by her daughter on today's visit. Chief Complaint  Patient presents with   Nail Problem    RFC,Referring Provider Doreene Nest, NP,lov:2 mos. ago      PCP is Chestine Spore, Keane Scrape, NP.  Allergies  Allergen Reactions   Crestor [Rosuvastatin Calcium] Rash and Other (See Comments)    Myalgia   Tylenol [Acetaminophen] Rash    Review of Systems: Negative except as noted in the HPI.  Objective: No changes noted in today's physical examination. Vitals:   07/16/22 1522  BP: (!) 157/62   Lindsay Snyder is a pleasant 77 y.o. female in NAD. AAO x 3.  Vascular Examination: Capillary refill time <3 seconds b/l LE. Palpable pedal pulses b/l LE. Digital hair present b/l. No pedal edema b/l. Skin temperature gradient WNL b/l. No varicosities b/l.  Dermatological Examination: Pedal skin with normal turgor, texture and tone b/l. No open wounds. No interdigital macerations b/l. Toenails 1-5 b/l thickened, discolored, dystrophic with subungual debris. There is pain on palpation to dorsal aspect of nailplates. .  Neurological Examination: Protective sensation intact with 10 gram monofilament b/l LE.  Musculoskeletal Examination: Normal muscle strength 5/5 to all lower extremity muscle groups bilaterally. No pain, crepitus or joint limitation noted with ROM b/l LE. No gross bony pedal deformities b/l. Patient ambulates independently without assistive aids.  Assessment/Plan: 1. Pain due to onychomycosis of toenails of both feet   -Patient's family member present. All questions/concerns addressed on today's visit. -Patient to continue soft, supportive shoe gear daily. -Toenails 1-5  bilaterally were debrided in length and girth with sterile nail nippers. Pinpoint bleeding of R 2nd toe addressed with Lumicain Hemostatic Solution, cleansed with alcohol. Triple antibiotic ointment applied. Patient/careigver instructed to apply triple antibiotic ointment once daily for 7 days. -Patient/POA to call should there be question/concern in the interim.   Return in about 3 months (around 10/16/2022).  Freddie Breech, DPM

## 2022-07-22 NOTE — Telephone Encounter (Signed)
Kelli, can you get a urine kit together for this patient's daughter to pick up?  I will place orders now.

## 2022-07-23 ENCOUNTER — Other Ambulatory Visit (INDEPENDENT_AMBULATORY_CARE_PROVIDER_SITE_OTHER): Payer: Medicare HMO

## 2022-07-23 DIAGNOSIS — N390 Urinary tract infection, site not specified: Secondary | ICD-10-CM

## 2022-07-23 LAB — POC URINALSYSI DIPSTICK (AUTOMATED)
Bilirubin, UA: NEGATIVE
Blood, UA: NEGATIVE
Glucose, UA: NEGATIVE
Ketones, UA: NEGATIVE
Leukocytes, UA: NEGATIVE
Nitrite, UA: NEGATIVE
Protein, UA: NEGATIVE
Spec Grav, UA: 1.01 (ref 1.010–1.025)
Urobilinogen, UA: 0.2 E.U./dL
pH, UA: 6 (ref 5.0–8.0)

## 2022-07-23 NOTE — Telephone Encounter (Signed)
Urine kit placed up front for pickup.

## 2022-07-27 ENCOUNTER — Other Ambulatory Visit: Payer: Self-pay | Admitting: Primary Care

## 2022-07-27 DIAGNOSIS — N3000 Acute cystitis without hematuria: Secondary | ICD-10-CM

## 2022-07-27 LAB — URINE CULTURE
MICRO NUMBER:: 15119001
SPECIMEN QUALITY:: ADEQUATE

## 2022-07-27 MED ORDER — SULFAMETHOXAZOLE-TRIMETHOPRIM 800-160 MG PO TABS
1.0000 | ORAL_TABLET | Freq: Two times a day (BID) | ORAL | 0 refills | Status: DC
Start: 2022-07-27 — End: 2022-09-06

## 2022-08-03 MED ORDER — FLUCONAZOLE 150 MG PO TABS
150.00 mg | ORAL_TABLET | Freq: Once | ORAL | 0 refills | Status: AC
Start: 2022-08-03 — End: 2022-08-03

## 2022-08-03 NOTE — Telephone Encounter (Signed)
Urine kit placed in front office for pt to pick up.

## 2022-08-03 NOTE — Telephone Encounter (Signed)
Can we get a urine kit together for this patient? Her daughter will collect the sample at home.

## 2022-08-06 ENCOUNTER — Other Ambulatory Visit: Payer: Medicare HMO

## 2022-08-06 ENCOUNTER — Other Ambulatory Visit: Payer: Self-pay

## 2022-08-06 DIAGNOSIS — N390 Urinary tract infection, site not specified: Secondary | ICD-10-CM | POA: Diagnosis not present

## 2022-08-06 LAB — POC URINALSYSI DIPSTICK (AUTOMATED)
Bilirubin, UA: NEGATIVE
Blood, UA: NEGATIVE
Glucose, UA: NEGATIVE
Ketones, UA: NEGATIVE
Leukocytes, UA: NEGATIVE
Nitrite, UA: NEGATIVE
Protein, UA: NEGATIVE
Spec Grav, UA: 1.01 (ref 1.010–1.025)
Urobilinogen, UA: 0.2 E.U./dL
pH, UA: 6 (ref 5.0–8.0)

## 2022-08-07 ENCOUNTER — Telehealth: Payer: Self-pay | Admitting: *Deleted

## 2022-08-07 LAB — URINE CULTURE: MICRO NUMBER:: 15170537

## 2022-08-07 NOTE — Progress Notes (Unsigned)
  Care Coordination Note  08/07/2022 Name: Lindsay Snyder MRN: [id number] DOB: 11/18/1945  Lindsay Snyder is a 77 y.o. year old female who is a primary care patient of Doreene Nest, NP and is actively engaged with the care management team. I reached out to Lindsay Snyder by phone today to assist with re-scheduling a follow up visit with the Licensed Clinical Social Worker  Follow up plan: Unsuccessful telephone outreach attempt made. A HIPAA compliant phone message was left for the patient providing contact information and requesting a return call.   Burman Nieves, CCMA Care Coordination Care Guide Direct Dial: [phone]

## 2022-08-08 NOTE — Progress Notes (Signed)
  Care Coordination Note  08/08/2022 Name: [name] MRN: [id number] DOB: 06-Oct-1945  Lindsay Snyder is a 77 y.o. year old female who is a primary care patient of Doreene Nest, NP and is actively engaged with the care management team. I reached out to Oletta Cohn by phone today to assist with re-scheduling a follow up visit with the Licensed Clinical Social Worker  Follow up plan: Telephone appointment with care management team member scheduled for: 08/14/2022  Burman Nieves, [facility] Care Coordination Care Guide Direct Dial: [phone]

## 2022-08-09 ENCOUNTER — Other Ambulatory Visit: Payer: Self-pay | Admitting: Primary Care

## 2022-08-09 DIAGNOSIS — N3000 Acute cystitis without hematuria: Secondary | ICD-10-CM

## 2022-08-09 LAB — URINE CULTURE: SPECIMEN QUALITY:: ADEQUATE

## 2022-08-09 MED ORDER — NITROFURANTOIN MONOHYD MACRO 100 MG PO CAPS
100.0000 mg | ORAL_CAPSULE | Freq: Two times a day (BID) | ORAL | 0 refills | Status: AC
Start: 2022-08-09 — End: 2022-08-16

## 2022-08-14 ENCOUNTER — Ambulatory Visit: Payer: Self-pay | Admitting: *Deleted

## 2022-08-14 NOTE — Patient Outreach (Signed)
  Care Coordination   Follow Up Visit Note   08/14/2022 Name: LATRESSA HARRIES MRN: [id number] DOB: May 14, 1945  EVERLYN FARABAUGH is a 77 y.o. year old female who sees Doreene Nest, NP for primary care. I spoke with  Lorna Dibble daughter by phone today.  What matters to the patients health and wellness today?  Memory care placement    Goals Addressed             This Visit's Progress    Memory care placement       Interventions Today    Flowsheet Row Most Recent Value  Chronic Disease   Chronic disease during today's visit Other  General Interventions   General Interventions Discussed/Reviewed General Interventions Reviewed, Community Resources, Level of Care  Level of Care Skilled Nursing Facility, Applications  [Patient's daughter confirms that her brother is the primary caregiver for patient during the day, however her condition is progressing to the point that she now needs 24 hour care-family requesting placement in a memory care facility]  Applications FL-2, Personal Care Services  [Personal care services no longer needed at this time, Fl-2 to be requested by provider for long term care]  Education Interventions   Applications FL-2, Personal Care Services  [facility] care services no longer needed at this time, Fl-2 to be requested by provider for long term care]              SDOH assessments and interventions completed:  No     Care Coordination Interventions:  Yes, provided   Follow up plan: Follow up call scheduled for 08/28/22    Encounter Outcome:  Pt. Visit Completed

## 2022-08-14 NOTE — Patient Instructions (Signed)
Visit Information  Thank you for taking time to visit with me today. Please don't hesitate to contact me if I can be of assistance to you.   Following are the goals we discussed today:  CSW to begin bed search for memory care   Our next appointment is by telephone on 08/28/22 at 3:30pm  Please call the care guide team at [phone] if you need to cancel or reschedule your appointment.   If you are experiencing a Mental Health or Behavioral Health Crisis or need someone to talk to, please call 911   Patient verbalizes understanding of instructions and care plan provided today and agrees to view in MyChart. Active MyChart status and patient understanding of how to access instructions and care plan via MyChart confirmed with patient.     Telephone follow up appointment with care management team member scheduled for: 08/28/22  [name], LCSW Clinical Social Worker  [facility] Care Management [phone]

## 2022-08-22 ENCOUNTER — Encounter: Payer: Self-pay | Admitting: *Deleted

## 2022-08-22 NOTE — [place] FL2 (Signed)
[place] MEDICAID FL2 LEVEL OF CARE FORM     IDENTIFICATION  Patient Name: Lindsay Snyder Birthdate: Aug 14, 1945 Sex: female Admission Date (Current Location): (Not on file)  [place] and [place] Number:      Facility and Address:  [facility], [address], [place], [place] [address]      Provider Number:    Attending Physician Name and Address:  No att. providers found  Relative Name and Phone Number:       Current Level of Care:   Recommended Level of Care: Other (Comment) (Patient has been diagnosed with Dementia and is in need of memory care) Prior Approval Number:    Date Approved/Denied:   PASRR Number: pending  Discharge Plan: Domiciliary (Rest home)    Current Diagnoses: Patient Active Problem List   Diagnosis Date Noted   Cancer (HCC) 05/02/2022   Vaginal itching 03/01/2022   AKI (acute kidney injury) (HCC) 02/05/2022   Dementia (HCC) 02/05/2022   Mood disorder (HCC) 02/05/2022   Overactive bladder 02/05/2022   Persistent cough for 3 weeks or longer 06/29/2021   Recurrent falls 06/29/2021   Osteoarthritis of hands, 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   Lower abdominal pain 02/03/2021   Urinary incontinence 02/03/2021   Memory changes 07/05/2020   Gastroesophageal reflux disease 07/05/2020   Generalized abdominal pain 03/01/2020   Environmental and seasonal allergies 01/14/2020   GAD (generalized anxiety disorder) 01/14/2020   Pain of left upper extremity 05/26/2019   Oral lesion 02/26/2019   Hemorrhoids 07/11/2017   Preventative health care 07/11/2017   Hyperlipidemia 07/10/2016   Insomnia due to stress 09/30/2014   Medicare annual wellness visit, subsequent 05/17/2014   Murmur, cardiac 05/17/2014   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   Rash and nonspecific skin eruption 04/15/2014   Osteopenia 04/15/2014   Obesity 04/15/2014    Orientation RESPIRATION BLADDER Height & Weight     Self, Place  Normal  Incontinent Weight:   Height:     BEHAVIORAL SYMPTOMS/MOOD NEUROLOGICAL BOWEL NUTRITION STATUS      Continent Diet  AMBULATORY STATUS COMMUNICATION OF NEEDS Skin   Independent Verbally Normal                       Personal Care Assistance Level of Assistance  Bathing, Feeding, Dressing Bathing Assistance: Maximum assistance Feeding assistance: Limited assistance Dressing Assistance: Independent     Functional Limitations Info  Sight, Hearing, Speech Sight Info:  (wears glasses) Hearing Info: Adequate Speech Info: Impaired (cannot find the words)    SPECIAL CARE FACTORS FREQUENCY                       Contractures Contractures Info: Not present    Additional Factors Info  Code Status, Allergies Code Status Info: full code Allergies Info: Tylenol-rash Crestor-rash           Current Medications (08/22/2022):  This is the current hospital active medication list Current Outpatient Medications  Medication Sig Dispense Refill   cetirizine (ZYRTEC) 10 MG tablet Take 10 mg by mouth daily.     donepezil (ARICEPT) 5 MG tablet Take 5 mg by mouth at bedtime.     DULoxetine (CYMBALTA) 20 MG capsule Take 2 capsules (40 mg total) by mouth daily. For anxiety 180 capsule 1   lithium carbonate 150 MG capsule Take 150 mg by mouth daily.     memantine (NAMENDA) 5 MG tablet Take 10 mg by mouth daily.  mirtazapine (REMERON) 15 MG tablet Take 15 mg by mouth at bedtime.     ondansetron (ZOFRAN-ODT) 4 MG disintegrating tablet Take 1 tablet (4 mg total) by mouth every 8 (eight) hours as needed for nausea or vomiting. 20 tablet 0   oxybutynin (DITROPAN XL) 15 MG 24 hr tablet TAKE 1 TABLET(15 MG) BY MOUTH AT BEDTIME FOR OVERACTIVE BLADDER 90 tablet 1   sulfamethoxazole-trimethoprim (BACTRIM DS) 800-160 MG tablet Take 1 tablet by mouth 2 (two) times daily. For urinary tract infection. 10 tablet 0   traZODone (DESYREL) 100 MG tablet Take 100 mg by mouth at bedtime.     No current  facility-administered medications for this visit.     Discharge Medications: Please see discharge summary for a list of discharge medications.  Relevant Imaging Results:  Relevant Lab Results:   Additional Information SS# [id number]  [name] [place], [place]

## 2022-08-25 ENCOUNTER — Other Ambulatory Visit: Payer: Self-pay | Admitting: Primary Care

## 2022-08-25 DIAGNOSIS — F411 Generalized anxiety disorder: Secondary | ICD-10-CM

## 2022-08-28 ENCOUNTER — Ambulatory Visit: Payer: Self-pay | Admitting: *Deleted

## 2022-08-28 NOTE — Patient Instructions (Signed)
Visit Information  Thank you for taking time to visit with me today. Please don't hesitate to contact me if I can be of assistance to you.   Following are the goals we discussed today:  1.Fl2 to be faxed out to all local memory care facilities for potential bed availabilities   Our next appointment is by telephone on 09/18/22 at 3pm  Please call the care guide team at [phone] if you need to cancel or reschedule your appointment.   If you are experiencing a Mental Health or Behavioral Health Crisis or need someone to talk to, please call 911   The patient verbalized understanding of instructions, educational materials, and care plan provided today and DECLINED offer to receive copy of patient instructions, educational materials, and care plan.   Telephone follow up appointment with care management team member scheduled for: 09/18/22  [name], LCSW Clinical Social Worker  [facility] Care Management [phone]

## 2022-08-28 NOTE — Patient Outreach (Addendum)
  Care Coordination   Follow Up Visit Note   08/28/2022 Name: BENI SPURGEON MRN: [id number] DOB: [birth year]  MIRAI DRAIN is a 77 y.o. year old female who sees Doreene Nest, NP for primary care. I spoke with  Lorna Dibble daughter by phone today.  What matters to the patients health and wellness today?  Memory Care placement     Goals Addressed             This Visit's Progress    Memory care placement       Interventions Today    Flowsheet Row Most Recent Value  General Interventions   General Interventions Discussed/Reviewed General Interventions Reviewed, Community Resources, Level of Care  [per patient's daughter patient's cognitive limitations continue to progress making placement more evident]  Level of Care Assisted Living  [bed search started regarding placement, [facility] contacted bed available will send FL2 for review]  Education Interventions   Education Provided Provided Education  Provided Verbal Education On Other  [placement process, daughter reminded that she will need to apply for special assistance medicaid]              SDOH assessments and interventions completed:  No     Care Coordination Interventions:  Yes, provided   Follow up plan: Follow up call scheduled for 09/18/22    Encounter Outcome:  Pt. Visit Completed

## 2022-08-29 ENCOUNTER — Telehealth: Payer: Self-pay | Admitting: *Deleted

## 2022-08-29 NOTE — Patient Outreach (Signed)
  Care Coordination   Follow Up Visit Note   08/29/2022 Name: Lindsay Snyder MRN: [id number] DOB: 06/26/1945  Lindsay Snyder is a 77 y.o. year old female who sees Doreene Nest, NP for primary care. I spoke with  Lorna Dibble daughter  by phone today.  What matters to the patients health and wellness today?  Placement in memory care    Goals Addressed             This Visit's Progress    Memory care placement       Interventions Today    Flowsheet Row Most Recent Value  Chronic Disease   Chronic disease during today's visit Other  [dementia]  General Interventions   General Interventions Discussed/Reviewed General Interventions Reviewed, Level of Care, Communication with  Communication with --  [[facility]]  Level of Care Skilled Nursing Facility  [patient's daughter remains agreeable to placement, confirmed that she is not POA officially but is able to make medical decisions regarding her care-[facility] contacted, they do have bed availability-will have to review the clinical]  Applications FL-2  [FL2 faxed to [phone]  Education Interventions   Applications FL-2  [FL2 faxed to [phone]              SDOH assessments and interventions completed:  No     Care Coordination Interventions:  Yes, provided   Follow up plan: Follow up call scheduled for 09/18/22    Encounter Outcome:  Pt. Visit Completed

## 2022-08-29 NOTE — Patient Instructions (Signed)
Visit Information  Thank you for taking time to visit with me today. Please don't hesitate to contact me if I can be of assistance to you.   Following are the goals we discussed today:  CSW to continue to follow status and coordinate placement in a memory care facility   Our next appointment is by telephone on 09/18/22 at 3pm  Please call the care guide team at [phone] if you need to cancel or reschedule your appointment.   If you are experiencing a Mental Health or Behavioral Health Crisis or need someone to talk to, please call 911   The patient verbalized understanding of instructions, educational materials, and care plan provided today and DECLINED offer to receive copy of patient instructions, educational materials, and care plan.   Telephone follow up appointment with care management team member scheduled for: 09/18/22  .cl

## 2022-08-30 ENCOUNTER — Encounter: Payer: Self-pay | Admitting: *Deleted

## 2022-08-30 NOTE — Patient Outreach (Signed)
  Care Coordination   Follow Up Visit Note   08/30/2022 Name: Lindsay Snyder MRN: [id number] DOB: 1946/01/01  Lindsay Snyder is a 77 y.o. year old female who sees Doreene Nest, NP for primary care. I  spoke with the Admissions Director for [facility] today regarding possible placement.  What matters to the patients health and wellness today?  Placement in memory care unit.    Goals Addressed             This Visit's Progress    Memory care placement       Interventions Today    Flowsheet Row Most Recent Value  Chronic Disease   Chronic disease during today's visit Other  [dementia]  General Interventions   General Interventions Discussed/Reviewed General Interventions Reviewed, Level of Care  Level of Care Skilled Nursing Facility  Applications FL-2  [confirmed with admissions director that Fl2 was not received-will send secure email to [email or web address] for review]  Education Interventions   Applications FL-2  [confirmed with admissions director that Cl[facility]is Cao was not received-will send secure email to [email or web address] for review]              SDOH assessments and interventions completed:  No     Care Coordination Interventions:  Yes, provided   Follow up plan: Follow up call scheduled for 09/18/22    Encounter Outcome:  Pt. Visit Completed

## 2022-08-31 ENCOUNTER — Encounter: Payer: Self-pay | Admitting: *Deleted

## 2022-08-31 NOTE — Patient Outreach (Signed)
  Care Coordination   Care Coordination  Visit Note   08/31/2022 Name: Lindsay Snyder MRN: [id number] DOB: 25-Oct-1945  Lindsay Snyder is a 77 y.o. year old female who sees Doreene Nest, NP for primary care.   What matters to the patients health and wellness today?  Memory Care Placement    Goals Addressed             This Visit's Progress    Memory care placement       Interventions Today    Flowsheet Row Most Recent Value  General Interventions   General Interventions Discussed/Reviewed Communication with  Communication with --  [Fl2 securely emailed to [facility] , [address] and [employer] faxed to [facility] [place] for placement review]              SDOH assessments and interventions completed:  No     Care Coordination Interventions:  Yes, provided   Follow up plan: Follow up call scheduled for 09/18/22    Encounter Outcome:  Pt. Visit Completed

## 2022-09-03 DIAGNOSIS — N390 Urinary tract infection, site not specified: Secondary | ICD-10-CM

## 2022-09-05 NOTE — Telephone Encounter (Signed)
Kit placed at reception for pick up.

## 2022-09-05 NOTE — Telephone Encounter (Signed)
Will someone get a urine kit together for this patient's daughter to pick up? She will come tomorrow with a sample ready for her appointment

## 2022-09-06 ENCOUNTER — Ambulatory Visit: Payer: Medicare HMO | Admitting: Primary Care

## 2022-09-06 ENCOUNTER — Encounter: Payer: Self-pay | Admitting: Primary Care

## 2022-09-06 VITALS — BP 128/68 | HR 70 | Temp 97.9°F | Ht 62.0 in | Wt 237.0 lb

## 2022-09-06 DIAGNOSIS — R82998 Other abnormal findings in urine: Secondary | ICD-10-CM | POA: Insufficient documentation

## 2022-09-06 DIAGNOSIS — R5383 Other fatigue: Secondary | ICD-10-CM | POA: Diagnosis not present

## 2022-09-06 LAB — CBC WITH DIFFERENTIAL/PLATELET
Basophils Absolute: 0 10*3/uL (ref 0.0–0.1)
Basophils Relative: 0.6 % (ref 0.0–3.0)
Eosinophils Absolute: 0.3 10*3/uL (ref 0.0–0.7)
Eosinophils Relative: 3.7 % (ref 0.0–5.0)
HCT: 45 % (ref 36.0–46.0)
Hemoglobin: 14.6 g/dL (ref 12.0–15.0)
Lymphocytes Relative: 20.7 % (ref 12.0–46.0)
Lymphs Abs: 1.7 10*3/uL (ref 0.7–4.0)
MCHC: 32.5 g/dL (ref 30.0–36.0)
MCV: 90.8 fl (ref 78.0–100.0)
Monocytes Absolute: 0.7 10*3/uL (ref 0.1–1.0)
Monocytes Relative: 8.5 % (ref 3.0–12.0)
Neutro Abs: 5.4 10*3/uL (ref 1.4–7.7)
Neutrophils Relative %: 66.5 % (ref 43.0–77.0)
Platelets: 222 10*3/uL (ref 150.0–400.0)
RBC: 4.96 Mil/uL (ref 3.87–5.11)
RDW: 14 % (ref 11.5–15.5)
WBC: 8.2 10*3/uL (ref 4.0–10.5)

## 2022-09-06 LAB — BASIC METABOLIC PANEL
BUN: 24 mg/dL — ABNORMAL HIGH (ref 6–23)
CO2: 29 mEq/L (ref 19–32)
Calcium: 9.2 mg/dL (ref 8.4–10.5)
Chloride: 101 mEq/L (ref 96–112)
Creatinine, Ser: 1.13 mg/dL (ref 0.40–1.20)
GFR: 46.74 mL/min — ABNORMAL LOW (ref >60.00)
Glucose, Bld: 93 mg/dL (ref 70–99)
Potassium: 4.5 mEq/L (ref 3.5–5.1)
Sodium: 135 mEq/L (ref 135–145)

## 2022-09-06 LAB — POC URINALSYSI DIPSTICK (AUTOMATED)
Bilirubin, UA: NEGATIVE
Blood, UA: NEGATIVE
Glucose, UA: NEGATIVE
Ketones, UA: NEGATIVE
Leukocytes, UA: NEGATIVE
Nitrite, UA: NEGATIVE
Protein, UA: NEGATIVE
Spec Grav, UA: <=1.005 — AB (ref 1.010–1.025)
Urobilinogen, UA: 0.2 E.U./dL
pH, UA: 6 (ref 5.0–8.0)

## 2022-09-06 NOTE — Patient Instructions (Signed)
Stop by the lab prior to leaving today. I will notify you of your results once received.   It was a pleasure to see you today!  

## 2022-09-06 NOTE — Assessment & Plan Note (Signed)
Increase sleeping could be advancing dementia. Will rule out metabolic cause.  CBC and BMP pending.  Urinalysis today negative. Urine culture ordered and pending.  She appears stable during exam.

## 2022-09-06 NOTE — Progress Notes (Signed)
Subjective:    Patient ID: Lindsay Snyder, female    DOB: 1945/03/13, 77 y.o.   MRN: [id number]  HPI  Lindsay Snyder is a very pleasant 77 y.o. female with a history of UTI, overactive bladder, dementia, GAD, abdominal pain, recurrent falls who presents today to discuss fatigue.  Her daughter joins [place] today who provides information for HPI.  Her daughter reached out to [place] via MyChart on 09/03/2022 reporting a decline in energy and paleness in her mother.  Given the symptoms it was requested that she come in for an office visit.  History of recurrent cystitis.  Last occurrence was on 08/06/2022, culture positive with Enterococcus faecalis, treated with nitrofurantoin course.  Also with cystitis on 07/23/2022, culture positive with Proteus mirabilis with pan resistance, treated with Bactrim DS tablets.   She completed her course of nitrofurantion from 08/06/22. Her daughter mentions that her mother is sleeping all day and all night. Her daughter believes she is more pale than usual. Her dementia has progressed, she's hardly liquids, cannot bathe herself, is not wiping well with bowel movements. Her appetite is good overall. Her daughter is working towards getting her set up in long term memory care.   The patient denies abdominal pain, GERD, dysuria, cough, fevers.   Review of Systems  Constitutional:  Positive for fatigue. Negative for fever.  Respiratory:  Negative for cough and shortness of breath.   Gastrointestinal:  Negative for abdominal pain.  Genitourinary:  Negative for dysuria, frequency and hematuria.  Psychiatric/Behavioral:  Negative for sleep disturbance.          Past Medical History:  Diagnosis Date   Allergy 010150   Anxiety 08/30/2019   Appendicitis 8    C. difficile diarrhea 02/04/2022   Cancer [facility])    daughter states patient never had cancer   Dementia (HCC)    GERD (gastroesophageal reflux disease) 01/30/2019   Otitis externa 10/14/2020   Polyp of colon     Benign    Social History   Socioeconomic History   Marital status: Married    Spouse name: Not on file   Number of children: Not on file   Years of education: Not on file   Highest education level: 12th grade  Occupational History   Not on file  Tobacco Use   Smoking status: Never   Smokeless tobacco: Never  Vaping Use   Vaping status: Never Used  Substance and Sexual Activity   Alcohol use: No   Drug use: Never   Sexual activity: Not Currently  Other Topics Concern   Not on file  Social History Narrative   From [place], moved to [place] for her ill daughter, moved to [place], moved back to [place].   Retired.   Lives in [place].   Enjoys babysitting, writing, reading, cooking.      Social Determinants of Health   Financial Resource Strain: Low Risk  (09/04/2022)   Overall Financial Resource Strain (CARDIA)    Difficulty of Paying Living Expenses: Not very hard  Food Insecurity: No Food Insecurity (09/04/2022)   Hunger Vital Sign    Worried About Running Out of Food in the Last Year: Never true    Ran Out of Food in the Last Year: Never true  Transportation Needs: No Transportation Needs (09/04/2022)   PRAPARE - Administrator, Civil Service (Medical): No    Lack of Transportation (Non-Medical): No  Physical Activity: Inactive (09/04/2022)   Exercise Vital Sign  Days of Exercise per Week: 0 days    Minutes of Exercise per Session: 20 min  Stress: No Stress Concern Present (09/04/2022)   [employer] of Occupational Health - Occupational Stress Questionnaire    Feeling of Stress : Only a little  Social Connections: Moderately Integrated (09/04/2022)   Social Connection and Isolation Panel [NHANES]    Frequency of Communication with Friends and Family: More than three times a week    Frequency of Social Gatherings with Friends and Family: More than three times a week    Attends Religious Services: More than 4 times per year    Active Member of [employer] or  Organizations: Yes    Attends Banker Meetings: More than 4 times per year    Marital Status: Separated  Intimate Partner Violence: Not At Risk (05/17/2022)   Humiliation, Afraid, Rape, and Kick questionnaire    Fear of Current or Ex-Partner: No    Emotionally Abused: No    Physically Abused: No    Sexually Abused: No    Past Surgical History:  Procedure Laterality Date   ABDOMINAL HYSTERECTOMY     APPENDECTOMY     BREAST EXCISIONAL BIOPSY Right    + 20 years neg   COLONOSCOPY WITH PROPOFOL N/A 12/16/2017   Procedure: COLONOSCOPY WITH PROPOFOL;  Surgeon: Wyline Mood, MD;  Location: [facility] ENDOSCOPY;  Service: Gastroenterology;  Laterality: N/A;   COLONOSCOPY WITH PROPOFOL N/A 01/02/2021   Procedure: COLONOSCOPY WITH PROPOFOL;  Surgeon: Wyline Mood, MD;  Location: [facility] ENDOSCOPY;  Service: Gastroenterology;  Laterality: N/A;    Family History  Problem Relation Age of Onset   Diabetes Mother        Deceased   Heart attack Father 36       Deceased   Cancer Father        Colon   Breast cancer Paternal Aunt     Allergies  Allergen Reactions   Crestor [Rosuvastatin Calcium] Rash and Other (See Comments)    Myalgia   Tylenol [Acetaminophen] Rash    Current Outpatient Medications on File Prior to Visit  Medication Sig Dispense Refill   cetirizine (ZYRTEC) 10 MG tablet Take 10 mg by mouth daily.     donepezil (ARICEPT) 5 MG tablet Take 5 mg by mouth at bedtime.     DULoxetine (CYMBALTA) 20 MG capsule TAKE 2 CAPSULES(40 MG) BY MOUTH DAILY FOR ANXIETY 180 capsule 1   lithium carbonate 150 MG capsule Take 150 mg by mouth daily.     memantine (NAMENDA) 5 MG tablet Take 10 mg by mouth daily.     mirtazapine (REMERON) 15 MG tablet Take 15 mg by mouth at bedtime.     oxybutynin (DITROPAN XL) 15 MG 24 hr tablet TAKE 1 TABLET(15 MG) BY MOUTH AT BEDTIME FOR OVERACTIVE BLADDER 90 tablet 1   traZODone (DESYREL) 100 MG tablet Take 100 mg by mouth at bedtime.     ondansetron  (ZOFRAN-ODT) 4 MG disintegrating tablet Take 1 tablet (4 mg total) by mouth every 8 (eight) hours as needed for nausea or vomiting. 20 tablet 0   No current facility-administered medications on file prior to visit.    BP 128/68   Pulse 70   Temp 97.9 F (36.6 C) (Temporal)   Ht 5\' 2"  (1.575 m)   Wt 237 lb (107.5 kg)   SpO2 94%   BMI 43.35 kg/m  Objective:   Physical Exam Cardiovascular:     Rate and Rhythm: Normal rate and regular  rhythm.  Pulmonary:     Effort: Pulmonary effort is normal.     Breath sounds: Normal breath sounds.  Abdominal:     General: Bowel sounds are normal.     Palpations: Abdomen is soft.     Tenderness: There is no abdominal tenderness.  Musculoskeletal:     Cervical back: Neck supple.  Skin:    General: Skin is warm and dry.  Neurological:     Mental Status: She is alert.     Comments: Does not participate in HPI questions            Assessment & Plan:  Lethargy Assessment & Plan: Increase sleeping could be advancing dementia. Will rule out metabolic cause.  CBC and BMP pending.  Urinalysis today negative. Urine culture ordered and pending.  She appears stable during exam.  Orders: -     POCT Urinalysis Dipstick (Automated) -     Urine Culture -     CBC with Differential/Platelet -     Basic metabolic panel        Doreene Nest, NP

## 2022-09-09 ENCOUNTER — Other Ambulatory Visit: Payer: Self-pay | Admitting: Primary Care

## 2022-09-09 DIAGNOSIS — N3000 Acute cystitis without hematuria: Secondary | ICD-10-CM

## 2022-09-09 MED ORDER — CIPROFLOXACIN HCL 250 MG PO TABS
250.0000 mg | ORAL_TABLET | Freq: Two times a day (BID) | ORAL | 0 refills | Status: AC
Start: 2022-09-09 — End: 2022-09-14

## 2022-09-11 ENCOUNTER — Telehealth: Payer: Self-pay | Admitting: *Deleted

## 2022-09-11 NOTE — Patient Outreach (Signed)
  Care Coordination   Follow Up Visit Note   09/11/2022 Name: Lindsay Snyder MRN: [id number] DOB: Jan 22, 1946  Lindsay Snyder is a 77 y.o. year old female who sees Doreene Nest, NP for primary care. I spoke with  Lindsay Snyder daughter by phone today.  What matters to the patients health and wellness today?  Placement in memory care facility and follow up on personal care services to manage ADL's..    Goals Addressed             This Visit's Progress    Memory care placement       Interventions Today    Flowsheet Row Most Recent Value  Chronic Disease   Chronic disease during today's visit Other  [dementia, re-current UTI]  General Interventions   General Interventions Discussed/Reviewed General Interventions Reviewed, Level of Care  Level of Care Skilled Nursing Facility, Personal Care Services  [Placement in memory care continues to coordinated, daughter would like follow up with PCS as well-[facility] contacted to follow up on status of in home care aid. They are agreeable to contacting patient's daughter with status]  Applications Personal Care Services  [patient approved for PCS, CSW has contacted [facility] to discuss status of assigned aid]  Education Interventions   Applications Personal Care Services  [patient approved for PCS, CSW has contacted [facility] to discuss status of assigned aid]              SDOH assessments and interventions completed:  No     Care Coordination Interventions:  Yes, provided   Follow up plan: Follow up call scheduled for 09/18/22    Encounter Outcome:  Pt. Visit Completed

## 2022-09-11 NOTE — Patient Instructions (Signed)
Visit Information  Thank you for taking time to visit with me today. Please don't hesitate to contact me if I can be of assistance to you.   Following are the goals we discussed today:  [facility] to contact patient's daughter regarding status of assigned in home care aid CSW to continue to work on placement in a memory care facilty   Our next appointment is by telephone on 09/18/22 at 3pm  Please call the care guide team at [phone] if you need to cancel or reschedule your appointment.   If you are experiencing a Mental Health or Behavioral Health Crisis or need someone to talk to, please call 911   Patient verbalizes understanding of instructions and care plan provided today and agrees to view in MyChart. Active MyChart status and patient understanding of how to access instructions and care plan via MyChart confirmed with patient.     Telephone follow up appointment with care management team member scheduled for: 09/18/22  [name], LCSW Clinical Social Worker  [facility] Care Management [phone]

## 2022-09-13 ENCOUNTER — Telehealth: Payer: Self-pay | Admitting: *Deleted

## 2022-09-13 NOTE — Patient Outreach (Signed)
  Care Coordination   Collaboration  Visit Note   09/13/2022 Name: Lindsay Snyder MRN: [id number] DOB: May 13, 1945  Lindsay Snyder is a 77 y.o. year old female who sees Doreene Nest, NP for primary care. I  spoke with [facility] today regarding the coordination of in home care services while memory care placement is being identified  What matters to the patients health and wellness today?  In home care while memory care placement is being identified.    Goals Addressed             This Visit's Progress    Memory care placement       Interventions Today    Flowsheet Row Most Recent Value  Chronic Disease   Chronic disease during today's visit Other  [dementia, re-current UTI]  General Interventions   General Interventions Discussed/Reviewed Level of Care  Level of Care Personal Care Services, Skilled Nursing Facility  [[facility] contacted and confirmed that aid will start on 09/17/22 3 hours per day. Fl2 provided to [facility] for review for memory care-med list previous provided. Pt to receive personal care services while mem care placement is being identified]              SDOH assessments and interventions completed:  No     Care Coordination Interventions:  Yes, provided   Follow up plan: Follow up call scheduled for 09/18/22    Encounter Outcome:  Pt. Visit Completed

## 2022-09-17 ENCOUNTER — Telehealth: Payer: Self-pay | Admitting: *Deleted

## 2022-09-17 NOTE — Patient Instructions (Signed)
Visit Information  Thank you for taking time to visit with me today. Please don't hesitate to contact me if I can be of assistance to you.   Following are the goals we discussed today:  [facility] to continue provide personal care services from 2-5pm starting 09/18/22 CSW to continue to follow up on Memory Care Facility placement options    Our next appointment is by telephone on 09/25/22 at 3pm  Please call the care guide team at [phone] if you need to cancel or reschedule your appointment.   If you are experiencing a Mental Health or Behavioral Health Crisis or need someone to talk to, please call 911   Patient verbalizes understanding of instructions and care plan provided today and agrees to view in MyChart. Active MyChart status and patient understanding of how to access instructions and care plan via MyChart confirmed with patient.     Telephone follow up appointment with care management team member scheduled for: 09/25/22   [name], LCSW Clinical Social Worker  [facility] Care Management [phone]

## 2022-09-17 NOTE — Patient Outreach (Signed)
  Care Coordination   Follow Up Visit Note   09/17/2022 Name: CAYLANI WEATHERWAX MRN: [id number] DOB: 02-12-1945  GIANNE ABSHIER is a 77 y.o. year old female who sees Doreene Nest, NP for primary care. I spoke with  Lorna Dibble daughter by phone today.  What matters to the patients health and wellness today?  Memory Care placement and personal care services     Goals Addressed             This Visit's Progress    Memory care placement       Interventions Today    Flowsheet Row Most Recent Value  Chronic Disease   Chronic disease during today's visit Other  [dementia]  General Interventions   General Interventions Discussed/Reviewed General Interventions Discussed, General Interventions Reviewed, Level of Care, Community Resources  Level of Care Personal Care Services  [[facility]-Fl2 and meds in review per admissions director-PCS in place and will start today while placement is being identified]  Applications FL-2  [[facility] contacted-Fl2 securely emailed to admissions for potential placement]  Education Interventions   Applications FL-2  [[facility] contacted-Fl2 securely emailed to admissions for potential placement]              SDOH assessments and interventions completed:  No     Care Coordination Interventions:  Yes, provided   Follow up plan: Follow up call scheduled for 09/25/22    Encounter Outcome:  Pt. Visit Completed

## 2022-09-18 ENCOUNTER — Encounter: Payer: Self-pay | Admitting: *Deleted

## 2022-09-23 NOTE — Telephone Encounter (Signed)
Can we get a urine kit set up front for patient's daughter to pick up?

## 2022-09-23 NOTE — Addendum Note (Signed)
Addended by: Doreene Nest on: 09/23/2022 07:31 PM   Modules accepted: Orders

## 2022-09-25 ENCOUNTER — Other Ambulatory Visit (INDEPENDENT_AMBULATORY_CARE_PROVIDER_SITE_OTHER): Payer: Medicare HMO

## 2022-09-25 ENCOUNTER — Ambulatory Visit: Payer: Self-pay | Admitting: *Deleted

## 2022-09-25 DIAGNOSIS — N3000 Acute cystitis without hematuria: Secondary | ICD-10-CM | POA: Diagnosis not present

## 2022-09-25 DIAGNOSIS — N390 Urinary tract infection, site not specified: Secondary | ICD-10-CM | POA: Diagnosis not present

## 2022-09-25 LAB — URINALYSIS, ROUTINE W REFLEX MICROSCOPIC
Bilirubin Urine: NEGATIVE
Hgb urine dipstick: NEGATIVE
Ketones, ur: NEGATIVE
Leukocytes,Ua: NEGATIVE
Nitrite: NEGATIVE
RBC / HPF: NONE SEEN (ref 0–?)
Specific Gravity, Urine: 1.01 (ref 1.000–1.030)
Total Protein, Urine: NEGATIVE
Urine Glucose: NEGATIVE
Urobilinogen, UA: 0.2 (ref 0.0–1.0)
pH: 6.5 (ref 5.0–8.0)

## 2022-09-26 LAB — URINE CULTURE
MICRO NUMBER:: 15387698
Result:: NO GROWTH
SPECIMEN QUALITY:: ADEQUATE

## 2022-09-26 NOTE — Patient Instructions (Signed)
Visit Information  Thank you for taking time to visit with me today. Please don't hesitate to contact me if I can be of assistance to you.   Following are the goals we discussed today:  Please continue to utilize in home personal care services until Memory Care Placement is identified  Our next appointment is by telephone on 10/24/22 at 3pm  Please call the care guide team at [phone] if you need to cancel or reschedule your appointment.   If you are experiencing a Mental Health or Behavioral Health Crisis or need someone to talk to, please call 911   Patient verbalizes understanding of instructions and care plan provided today and agrees to view in MyChart. Active MyChart status and patient understanding of how to access instructions and care plan via MyChart confirmed with patient.     Telephone follow up appointment with care management team member scheduled for: 10/24/22  [name], LCSW Clinical Social Worker  [phone]

## 2022-09-26 NOTE — Patient Outreach (Signed)
  Care Coordination   Follow Up Visit Note   09/26/2022 late entry Name: Lindsay Snyder MRN: [id number] DOB: Sep 20, 1945  Lindsay Snyder is a 77 y.o. year old female who sees Doreene Nest, NP for primary care. I spoke with  Lindsay Snyder daughter on 09/25/22..  What matters to the patients health and wellness today?  Placement in memory care due to patient's cognitive decline.    Goals Addressed             This Visit's Progress    Memory care placement       Interventions Today    Flowsheet Row Most Recent Value  Chronic Disease   Chronic disease during today's visit Other  [dementia]  General Interventions   General Interventions Discussed/Reviewed General Interventions Discussed, General Interventions Reviewed, Level of Care  Level of Care Skilled Nursing Facility  [Plan remains for patient to transition to long term care once identified-CSW to continue to follow up on status of memory care referral]  Applications Personal Care Services, FL-2  [patient to continue to received personal care services 2x per week until long term care placement is identified. Per patient's daughter, in home care has proven beneficial and will continue-Fl2 to be updated]  Education Interventions   Applications Personal Care Services, FL-2  [patient to continue to received personal care services 2x per week until long term care placement is identified. Per patient's daughter, in home care has proven beneficial and will continue-Fl2 to be updated]              SDOH assessments and interventions completed:  No     Care Coordination Interventions:  Yes, provided   Follow up plan: Follow up call scheduled for 10/24/22    Encounter Outcome:  Pt. Visit Completed

## 2022-10-18 ENCOUNTER — Ambulatory Visit (INDEPENDENT_AMBULATORY_CARE_PROVIDER_SITE_OTHER): Payer: Medicare HMO | Admitting: Podiatry

## 2022-10-18 DIAGNOSIS — M79674 Pain in right toe(s): Secondary | ICD-10-CM

## 2022-10-18 DIAGNOSIS — M79675 Pain in left toe(s): Secondary | ICD-10-CM

## 2022-10-18 DIAGNOSIS — B351 Tinea unguium: Secondary | ICD-10-CM

## 2022-10-19 ENCOUNTER — Ambulatory Visit: Payer: Medicare HMO | Admitting: Podiatry

## 2022-10-22 ENCOUNTER — Encounter: Payer: Self-pay | Admitting: Podiatry

## 2022-10-22 NOTE — Progress Notes (Signed)
Subjective:  Patient ID: Lindsay Snyder, female    DOB: 06-12-1945,  MRN: [id number]  77 y.o. female presents painful thick toenails that are difficult to trim. Pain interferes with ambulation. Aggravating factors include wearing enclosed shoe gear. Pain is relieved with periodic professional debridement.  Chief Complaint  Patient presents with   Nail Problem    RFC,Referring Provider Doreene Nest, NP,lov:08/24       New problem(s): None   PCP is Doreene Nest, NP.  Allergies  Allergen Reactions   Crestor [Rosuvastatin Calcium] Rash and Other (See Comments)    Myalgia   Tylenol [Acetaminophen] Rash    Review of Systems: Negative except as noted in the HPI.   Objective:  Lindsay Snyder is a pleasant 77 y.o. female in NAD.Marland Kitchen AAO x 3.  Vascular Examination: Vascular status intact b/l with palpable pedal pulses. CFT immediate b/l. Pedal hair present. No edema. No pain with calf compression b/l. Skin temperature gradient WNL b/l. No varicosities noted. No cyanosis or clubbing noted.  Neurological Examination: Sensation grossly intact b/l with 10 gram monofilament. Vibratory sensation intact b/l.  Dermatological Examination: Pedal skin with normal turgor, texture and tone b/l. No open wounds nor interdigital macerations noted. Toenails 1-5 b/l thick, discolored, elongated with subungual debris and pain on dorsal palpation. No hyperkeratotic lesions noted b/l.   Musculoskeletal Examination: Muscle strength 5/5 to b/l LE.  No pain, crepitus noted b/l. No gross pedal deformities. Patient ambulates independently without assistive aids.   Radiographs: None  Assessment:   1. Pain due to onychomycosis of toenails of both feet    Plan:  -Consent given for treatment as described below: -Examined patient. -Continue supportive shoe gear daily. -Mycotic toenails 1-5 bilaterally were debrided in length and girth with sterile nail nippers and dremel without  incident. -Patient/POA to call should there be question/concern in the interim.  Return in about 3 months (around 01/17/2023).  Freddie Breech, DPM

## 2022-10-24 ENCOUNTER — Ambulatory Visit: Payer: Self-pay | Admitting: *Deleted

## 2022-10-24 ENCOUNTER — Telehealth: Payer: Self-pay | Admitting: *Deleted

## 2022-10-24 NOTE — Patient Outreach (Signed)
Care Coordination   Follow Up Visit Note   10/24/2022 Name: Lindsay Snyder MRN: [id number] DOB: Jan 06, 1946  Lindsay Snyder is a 77 y.o. year old female who sees Doreene Nest, NP for primary care. I spoke with  Lindsay Snyder 's daughter by phone today.  What matters to the patients health and wellness today?  Personal care services with long term plan to transition to long term care placement    Goals Addressed             This Visit's Progress    Memory care placement       Interventions Today    Flowsheet Row Most Recent Value  Chronic Disease   Chronic disease during today's visit Other  [dementia]  General Interventions   General Interventions Discussed/Reviewed General Interventions Reviewed, Level of Care, Communication with  [Pt's daughter confirms that patient's condition continues to progress -having a hard time getting patient to take in fluids patient's daughter provided with contact for the [employer] for assistance with behavioral concerns 24/7 [phone]]  Communication with --  [Return call from [facility], no beds available at this time, patient is on wait list]  Level of Care Skilled Nursing Facility, Personal Care Services  [Patient's daughter remains agreeable to long term care-no bed offers at this time. personal care services provided 2x per week-finds service highly beneficial-]              SDOH assessments and interventions completed:  No     Care Coordination Interventions:  Yes, provided   Follow up plan: Follow up call scheduled for 11/14/22    Encounter Outcome:  Patient Visit Completed

## 2022-10-24 NOTE — Patient Instructions (Signed)
Visit Information  Thank you for taking time to visit with me today. Please don't hesitate to contact me if I can be of assistance to you.   Following are the goals we discussed today:  Patient's daughter to contact the [employer]  [employer] for caregiver support and information [phone] Please contact patient's doctor with any questions regarding patient's medical care and follow up CSW to continue research available beds on a Memory Care unit   Our next appointment is by telephone on 11/14/22 at 3pm  Please call the care guide team at [phone] if you need to cancel or reschedule your appointment.   If you are experiencing a Mental Health or Behavioral Health Crisis or need someone to talk to, please call 911   Patient verbalizes understanding of instructions and care plan provided today and agrees to view in MyChart. Active MyChart status and patient understanding of how to access instructions and care plan via MyChart confirmed with patient.     Telephone follow up appointment with care management team member scheduled for: 11/14/22  [name], LCSW [place]  [facility], [facility] Licensed Clinical Social Worker Care Coordinator  Direct Dial: [phone]

## 2022-10-24 NOTE — Patient Outreach (Signed)
Care Coordination   10/24/2022 Name: Lindsay Snyder MRN: [id number] DOB: August 18, 1945   Care Coordination Outreach Attempts:  An unsuccessful telephone outreach was attempted today to offer the patient information about available care coordination services.  Follow Up Plan:  Additional outreach attempts will be made to offer the patient care coordination information and services.   Encounter Outcome:  No Answer   Care Coordination Interventions:  No, not indicated    Dedric Ethington, LCSW [place]  [facility], [facility] Licensed Clinical Social Worker Care Coordinator  Direct Dial: [phone]

## 2022-11-07 DIAGNOSIS — D2261 Melanocytic nevi of right upper limb, including shoulder: Secondary | ICD-10-CM | POA: Diagnosis not present

## 2022-11-07 DIAGNOSIS — D225 Melanocytic nevi of trunk: Secondary | ICD-10-CM | POA: Diagnosis not present

## 2022-11-07 DIAGNOSIS — D2272 Melanocytic nevi of left lower limb, including hip: Secondary | ICD-10-CM | POA: Diagnosis not present

## 2022-11-07 DIAGNOSIS — C44212 Basal cell carcinoma of skin of right ear and external auricular canal: Secondary | ICD-10-CM | POA: Diagnosis not present

## 2022-11-07 DIAGNOSIS — L821 Other seborrheic keratosis: Secondary | ICD-10-CM | POA: Diagnosis not present

## 2022-11-07 DIAGNOSIS — D2262 Melanocytic nevi of left upper limb, including shoulder: Secondary | ICD-10-CM | POA: Diagnosis not present

## 2022-11-07 DIAGNOSIS — D485 Neoplasm of uncertain behavior of skin: Secondary | ICD-10-CM | POA: Diagnosis not present

## 2022-11-07 DIAGNOSIS — D2271 Melanocytic nevi of right lower limb, including hip: Secondary | ICD-10-CM | POA: Diagnosis not present

## 2022-11-14 ENCOUNTER — Ambulatory Visit: Payer: Self-pay | Admitting: *Deleted

## 2022-11-14 NOTE — Patient Instructions (Signed)
Visit Information  Thank you for taking time to visit with me today. Please don't hesitate to contact me if I can be of assistance to you.   Following are the goals we discussed today:  Please contact [facility] [phone] and [facility] [phone] regarding possible placement availabilities  Please contact your providers office with any questions/concerns regarding your medical care   Our next appointment is by telephone on 11/28/22 at 3pm  Please call the care guide team at [phone] if you need to cancel or reschedule your appointment.   If you are experiencing a Mental Health or Behavioral Health Crisis or need someone to talk to, please call 911   Patient verbalizes understanding of instructions and care plan provided today and agrees to view in MyChart. Active MyChart status and patient understanding of how to access instructions and care plan via MyChart confirmed with patient.     Telephone follow up appointment with care management team member scheduled for: 11/28/22  [name], LCSW [place]  [facility], [facility] Licensed Clinical Social Worker Care Coordinator  Direct Dial: [phone]

## 2022-11-14 NOTE — Patient Outreach (Signed)
Care Coordination   Follow Up Visit Note   11/14/2022 Name: ARRIYA RAVI MRN: [id number] DOB: [birth year]  RUMAN TANI is a 77 y.o. year old female who sees Doreene Nest, NP for primary care. I spoke with  Oletta Cohn by phone today.  What matters to the patients health and wellness today?  Placement in memory care     Goals Addressed             This Visit's Progress    Memory care placement       Interventions Today    Flowsheet Row Most Recent Value  Chronic Disease   Chronic disease during today's visit Other  [dementia]  General Interventions   General Interventions Discussed/Reviewed General Interventions Reviewed, Community Resources, Level of Care  [patient's memory care issues continue to elevate, continued difficulty with getting patient to take in fluids-no bed offers at this time]  Level of Care Skilled Nursing Facility, Personal Care Services  [confirmed continued need for out of home placement-pt continues to receives in home care, however per pt's daughter patient's memory care needs continue to elevate [facility], [facility] contacted-LVM-pt's daughter agreeable to call aswell]  Education Interventions   Education Provided Provided Education  Provided Verbal Education On [employer]  [continue to provide follow up [id number] of process of bed search-no bed offers at this time]              SDOH assessments and interventions completed:  No     Care Coordination Interventions:  Yes, provided   Follow up plan: Follow up call scheduled for 11/28/22    Encounter Outcome:  Patient Visit Completed

## 2022-11-20 ENCOUNTER — Telehealth: Payer: Self-pay | Admitting: *Deleted

## 2022-11-20 NOTE — Patient Outreach (Signed)
Care Coordination   Follow Up Visit Note   11/20/2022 Name: VENONA BARREIRO MRN: [id number] DOB: Aug 06, 1945  KAYANA FOLDEN is a 77 y.o. year old female who sees Doreene Nest, NP for primary care. I spoke with  Oletta Cohn by phone today.  What matters to the patients health and wellness today?  Placement is memory care-assisted living     Goals Addressed             This Visit's Progress    Memory care placement       Interventions Today    Flowsheet Row Most Recent Value  Chronic Disease   Chronic disease during today's visit Other  [dementia]  General Interventions   General Interventions Discussed/Reviewed General Interventions Reviewed, Level of Care  Level of Care Assisted Living  [confirmed that daughter visited [address] and [address] has a bed-considering-however would like options in the [employer] Area-option provided for [facility], [facility] [employer] and [employer] will schedule tours]  Education Interventions   Education Provided Provided Education  Provided Engineer, petroleum On Other  [regarding placement process and options-confirmed plan to schedule tours with [facility], [facility], and [facility]]              SDOH assessments and interventions completed:  No     Care Coordination Interventions:  Yes, provided   Follow up plan: Follow up call scheduled for 11/28/22    Encounter Outcome:  Patient Visit Completed

## 2022-11-20 NOTE — Patient Instructions (Signed)
Visit Information  Thank you for taking time to visit with me today. Please don't hesitate to contact me if I can be of assistance to you.   Following are the goals we discussed today:  Please schedule tours with additional memory care facilities ie [facility] [phone], [facility] [phone], [facility] [phone]   Our next appointment is by telephone on 11/28/22 at 3pm  Please call the care guide team at [phone] if you need to cancel or reschedule your appointment.   If you are experiencing a Mental Health or Behavioral Health Crisis or need someone to talk to, please call 911   Patient verbalizes understanding of instructions and care plan provided today and agrees to view in MyChart. Active MyChart status and patient understanding of how to access instructions and care plan via MyChart confirmed with patient.     Telephone follow up appointment with care management team member scheduled for: 11/28/22  [name], LCSW [place]  [facility], [facility] Licensed Clinical Social Worker Care Coordinator  Direct Dial: [phone]

## 2022-11-21 ENCOUNTER — Telehealth: Payer: Self-pay | Admitting: *Deleted

## 2022-11-21 NOTE — Patient Instructions (Signed)
Visit Information  Thank you for taking time to visit with me today. Please don't hesitate to contact me if I can be of assistance to you.   Following are the goals we discussed today:  Please complete patient assessment and tour at [facility] on  11/23/22 Please present to the Department of Social Services to initiated process for Special Assistance Medicaid [address] [place], [place] [address]   Our next appointment is by telephone on 11/28/22 at 3pm  Please call the care guide team at [phone] if you need to cancel or reschedule your appointment.   If you are experiencing a Mental Health or Behavioral Health Crisis or need someone to talk to, please call 911   Patient verbalizes understanding of instructions and care plan provided today and agrees to view in MyChart. Active MyChart status and patient understanding of how to access instructions and care plan via MyChart confirmed with patient.     Telephone follow up appointment with care management team member scheduled for: 11/28/22  [name], LCSW [place]  [facility], [facility] Health Licensed Clinical Social Worker Care Coordinator  Direct Dial: [phone]

## 2022-11-21 NOTE — Patient Outreach (Signed)
Care Coordination   Follow Up Visit Note   11/21/2022 Name: Lindsay Snyder MRN: [id number] DOB: April 26, 1945  Lindsay Snyder is a 77 y.o. year old female who sees Doreene Nest, NP for primary care. I spoke with  Lindsay Snyder by phone today.  What matters to the patients health and wellness today?  Placement in Memory Care    Goals Addressed             This Visit's Progress    Memory care placement       Interventions Today    Flowsheet Row Most Recent Value  Chronic Disease   Chronic disease during today's visit Other  [dementia]  General Interventions   General Interventions Discussed/Reviewed General Interventions Reviewed, Level of Care  [continued plan to transition patient to a assisted living facility]  Level of Care Assisted Living, Applications  [confirmed that patient's daughter has a patient  assessment and tour scheduled with [facility] on 11/23/22]  Applications Medicaid, [place]-2  [discussed need to apply for special assistance medicaid on patient's behalf to cover cost of assisted living placement-updated Fl2 to be requested-daughter agreeable to presenting to the Dept of socisl services on 11/26/22]  Education Interventions   Education Provided Provided Education, Provided Printed Education  Provided Verbal Education On Other  [special assistance medicaid process-need to apply for assistnace in person through the Department of Social Services-provided patient's daughter with printed material regarding application process]  Applications Medicaid, [place]-2  [discussed need to apply for special assistance medicaid on patient's behalf to cover cost of assisted living placement-updated Fl2 to be requested-daughter agreeable to presenting to the Dept of socisl services on 11/26/22]              SDOH assessments and interventions completed:  No     Care Coordination Interventions:  Yes, provided   Follow up plan: Follow up call scheduled for  11/28/22    Encounter Outcome:  Patient Visit Completed

## 2022-11-27 ENCOUNTER — Telehealth: Payer: Self-pay | Admitting: *Deleted

## 2022-11-27 NOTE — Patient Instructions (Signed)
Visit Information  Thank you for taking time to visit with me today. Please don't hesitate to contact me if I can be of assistance to you.   Following are the goals we discussed today:  Please continue to discuss placement level of care options with family and primary care provider Please contact your provider with any questions regarding your medical care Please contact this social worker with additional care coordination needs related to Assisted Living Placement    Our next appointment is by telephone on 12/04/22 at 3pm  Please call the care guide team at [phone] if you need to cancel or reschedule your appointment.   If you are experiencing a Mental Health or Behavioral Health Crisis or need someone to talk to, please call 911   Patient verbalizes understanding of instructions and care plan provided today and agrees to view in MyChart. Active MyChart status and patient understanding of how to access instructions and care plan via MyChart confirmed with patient.     Telephone follow up appointment with care management team member scheduled for: 12/04/22 [name], LCSW [place]  [facility], [facility] Health Licensed Clinical Social Worker Care Coordinator  Direct Dial: [phone]

## 2022-11-27 NOTE — Patient Outreach (Signed)
Care Coordination   Follow Up Visit Note   11/27/2022 Name: Lindsay Snyder MRN: [id number] DOB: November 02, 1945  Lindsay Snyder is a 77 y.o. year old female who sees Doreene Nest, NP for primary care. I spoke with  Lindsay Snyder by phone today.  What matters to the patients health and wellness today?  Placement in memory care    Goals Addressed             This Visit's Progress    Memory care placement       Interventions Today    Flowsheet Row Most Recent Value  Chronic Disease   Chronic disease during today's visit Other  [dementia]  General Interventions   General Interventions Discussed/Reviewed General Interventions Reviewed, Level of Care  Level of Care Assisted Living  [confirmed that pt's daughter has visited [address] -feels that it may be a good fit-will continue to consider placement options. agreeable to discussing options with patient's provider re: level of cafe recommendation memory care vs. regular unit]  Applications Medicaid  [confirmed that she has applied for special assistance medicaid for placement in assisted living]  Education Interventions   Applications Medicaid  [confirmed that she has applied for special assistance medicaid for placement in assisted living]  Mental Health Interventions   Mental Health Discussed/Reviewed Other  [emotional support continues to be provided related to placement decision]              SDOH assessments and interventions completed:  No     Care Coordination Interventions:  Yes, provided   Follow up plan: Follow up call scheduled for 12/04/22    Encounter Outcome:  Patient Visit Completed

## 2022-11-28 ENCOUNTER — Encounter: Payer: Self-pay | Admitting: *Deleted

## 2022-11-29 ENCOUNTER — Other Ambulatory Visit: Payer: Self-pay | Admitting: Primary Care

## 2022-11-29 DIAGNOSIS — F03B4 Unspecified dementia, moderate, with anxiety: Secondary | ICD-10-CM

## 2022-11-29 DIAGNOSIS — Z111 Encounter for screening for respiratory tuberculosis: Secondary | ICD-10-CM

## 2022-11-29 MED ORDER — MEMANTINE HCL 5 MG PO TABS
10.0000 mg | ORAL_TABLET | Freq: Every day | ORAL | 1 refills | Status: AC
Start: 2022-11-29 — End: ?

## 2022-11-30 ENCOUNTER — Other Ambulatory Visit (INDEPENDENT_AMBULATORY_CARE_PROVIDER_SITE_OTHER): Payer: Medicare HMO

## 2022-11-30 DIAGNOSIS — Z111 Encounter for screening for respiratory tuberculosis: Secondary | ICD-10-CM | POA: Diagnosis not present

## 2022-12-04 ENCOUNTER — Telehealth: Payer: Self-pay | Admitting: *Deleted

## 2022-12-04 ENCOUNTER — Encounter: Payer: Self-pay | Admitting: *Deleted

## 2022-12-04 LAB — QUANTIFERON-TB GOLD PLUS
Mitogen-NIL: >10 [IU]/mL
NIL: 0.04 [IU]/mL
QuantiFERON-TB Gold Plus: NEGATIVE
TB1-NIL: 0 [IU]/mL
TB2-NIL: 0.01 [IU]/mL

## 2022-12-04 NOTE — Patient Outreach (Signed)
  Care Coordination   12/04/2022 Name: Lindsay Snyder MRN: [id number] DOB: Mar 19, 1945   Care Coordination Outreach Attempts:  An unsuccessful telephone outreach was attempted today to offer the patient information about available care coordination services.  Follow Up Plan:  Additional outreach attempts will be made to offer the patient care coordination information and services.   Encounter Outcome:  No Answer   Care Coordination Interventions:  No, not indicated    Lochlan Grygiel, LCSW   [facility], [facility] Health Licensed Clinical Social Worker Care Coordinator  Direct Dial: [phone]

## 2022-12-04 NOTE — Patient Instructions (Signed)
Visit Information  Thank you for taking time to visit with me today. Please don't hesitate to contact me if I can be of assistance to you.   Following are the goals we discussed today:  Please continue to follow up with the Department of Social Services regarding status of Special Assistance medicaid application Please continue to follow up with the [facility] regarding placement plans-assessment scheduled for 12/05/22 Please follow up with your provider regarding any questions regarding patient's medical care   Our next appointment is by telephone on 12/11/22 at 3pm  Please call the care guide team at [phone] if you need to cancel or reschedule your appointment.   If you are experiencing a Mental Health or Behavioral Health Crisis or need someone to talk to, please call 911   Patient verbalizes understanding of instructions and care plan provided today and agrees to view in MyChart. Active MyChart status and patient understanding of how to access instructions and care plan via MyChart confirmed with patient.     Telephone follow up appointment with care management team member scheduled for: 12/11/22  [name], LCSW [place]  [facility], [facility] Health Licensed Clinical Social Worker Care Coordinator  Direct Dial: [phone]

## 2022-12-04 NOTE — Patient Outreach (Signed)
  Care Coordination   Follow Up Visit Note   12/04/2022 Name: Lindsay Snyder MRN: [id number] DOB: [birth year]  Lindsay Snyder is a 77 y.o. year old female who sees Doreene Nest, NP for primary care. I spoke with  Lorna Dibble daughter by phone today.  What matters to the patients health and wellness today?  Placement in memory care. Patient has placement assessment scheduled for 12/05/22 at the [facility].   Goals Addressed             This Visit's Progress    Memory care placement       Interventions Today    Flowsheet Row Most Recent Value  Chronic Disease   Chronic disease during today's visit Other  [dementia]  General Interventions   General Interventions Discussed/Reviewed General Interventions Reviewed, Level of Care  Level of Care Skilled Nursing Facility, Applications  [confirmed that patient has a scheduled assessment for 12/05/22 at the [facility]-TB test completed]  Applications Medicaid  [special assistance medicaid application iniciated-pt's daughter to notify them of status of placement by 12/10/22]  Education Interventions   Applications Medicaid  [special assistance medicaid application iniciated-pt's daughter to notify them of status of placement by 12/10/22]  Mental Health Interventions   Mental Health Discussed/Reviewed Mental Health Reviewed, Coping Strategies  [caregiver stress acknowledged, placement decison continues to be processed including strategies to assist with transition]  Safety Interventions   Safety Discussed/Reviewed Safety Reviewed              SDOH assessments and interventions completed:  No     Care Coordination Interventions:  Yes, provided   Follow up plan: Follow up call scheduled for 12/11/22    Encounter Outcome:  Patient Visit Completed

## 2022-12-10 IMAGING — DX DG CHEST 2V
2 series · 2 of 2 positions shown · non-contrast
Comparison: None Available.

CLINICAL DATA: persistent cough

EXAM:
CHEST - 2 VIEW

[chest pa]
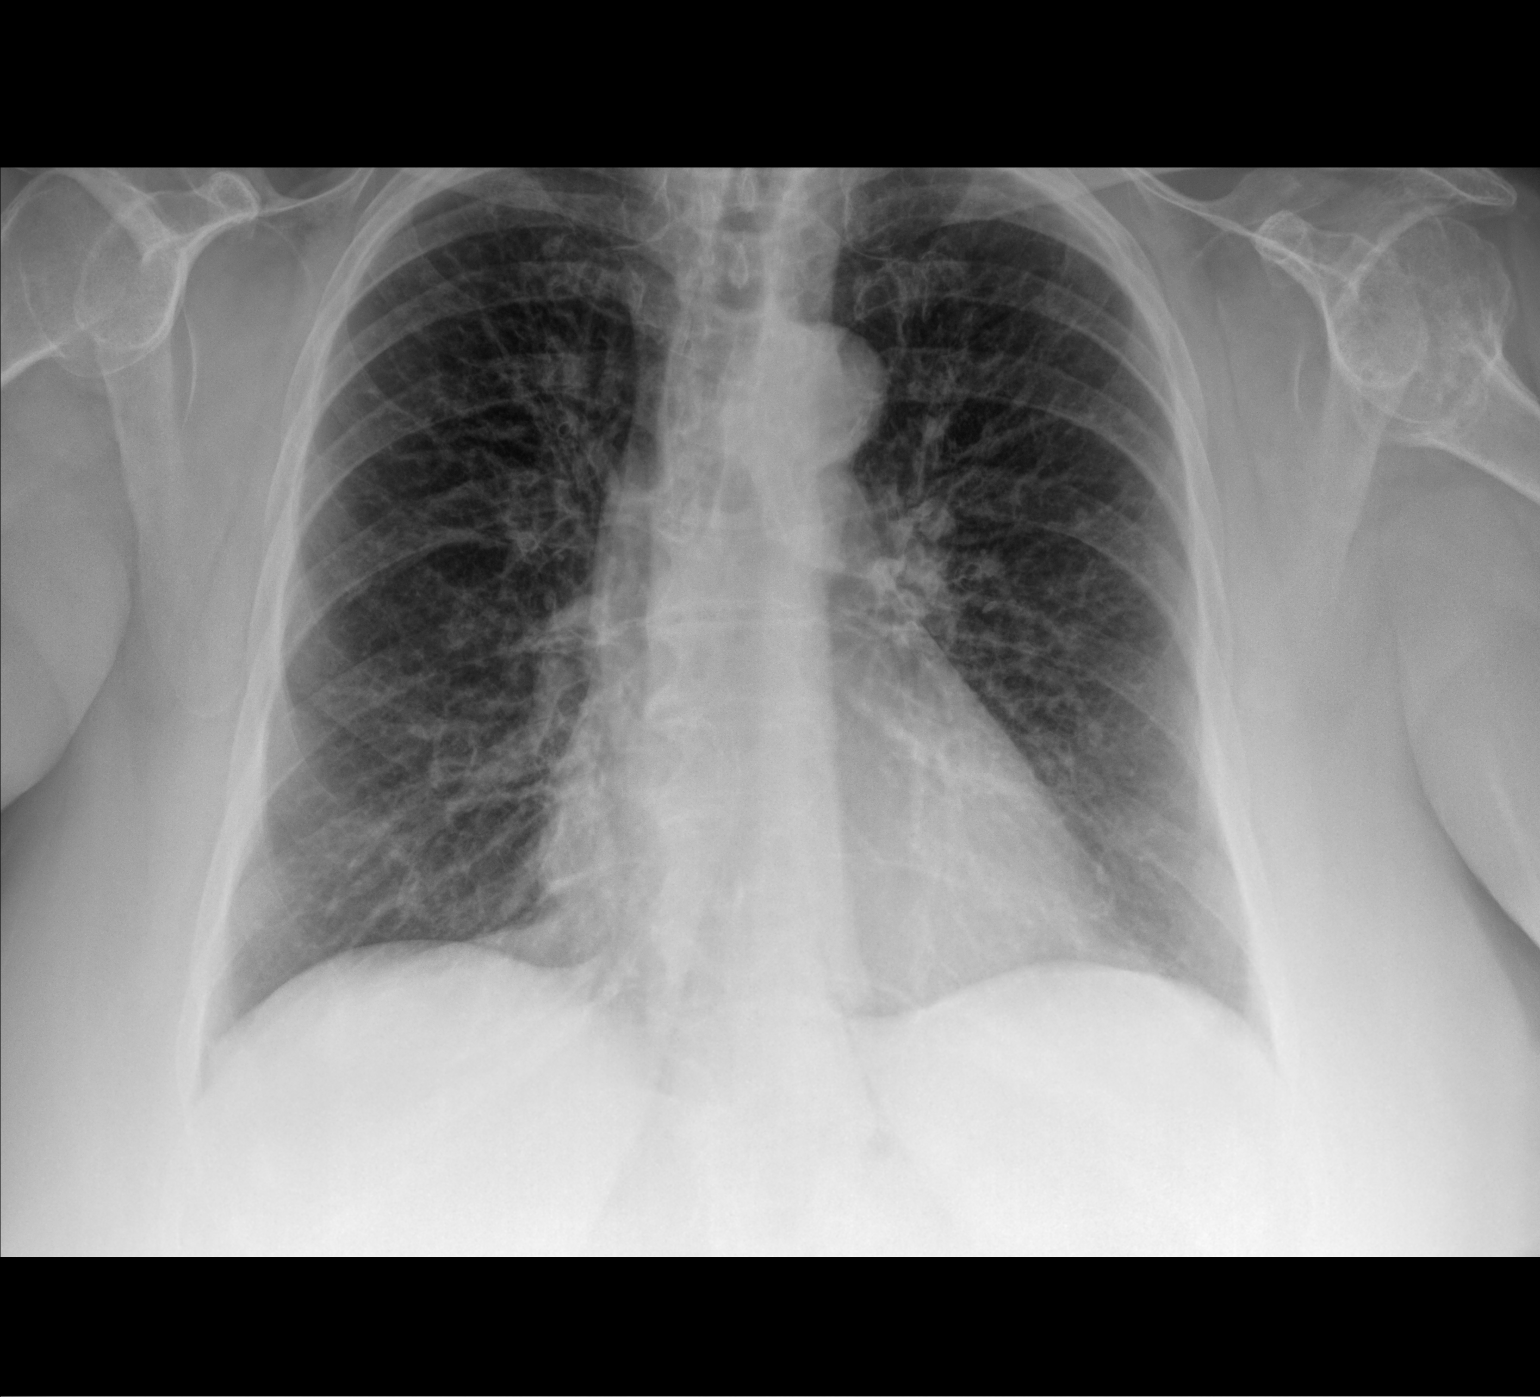

[chest lat]
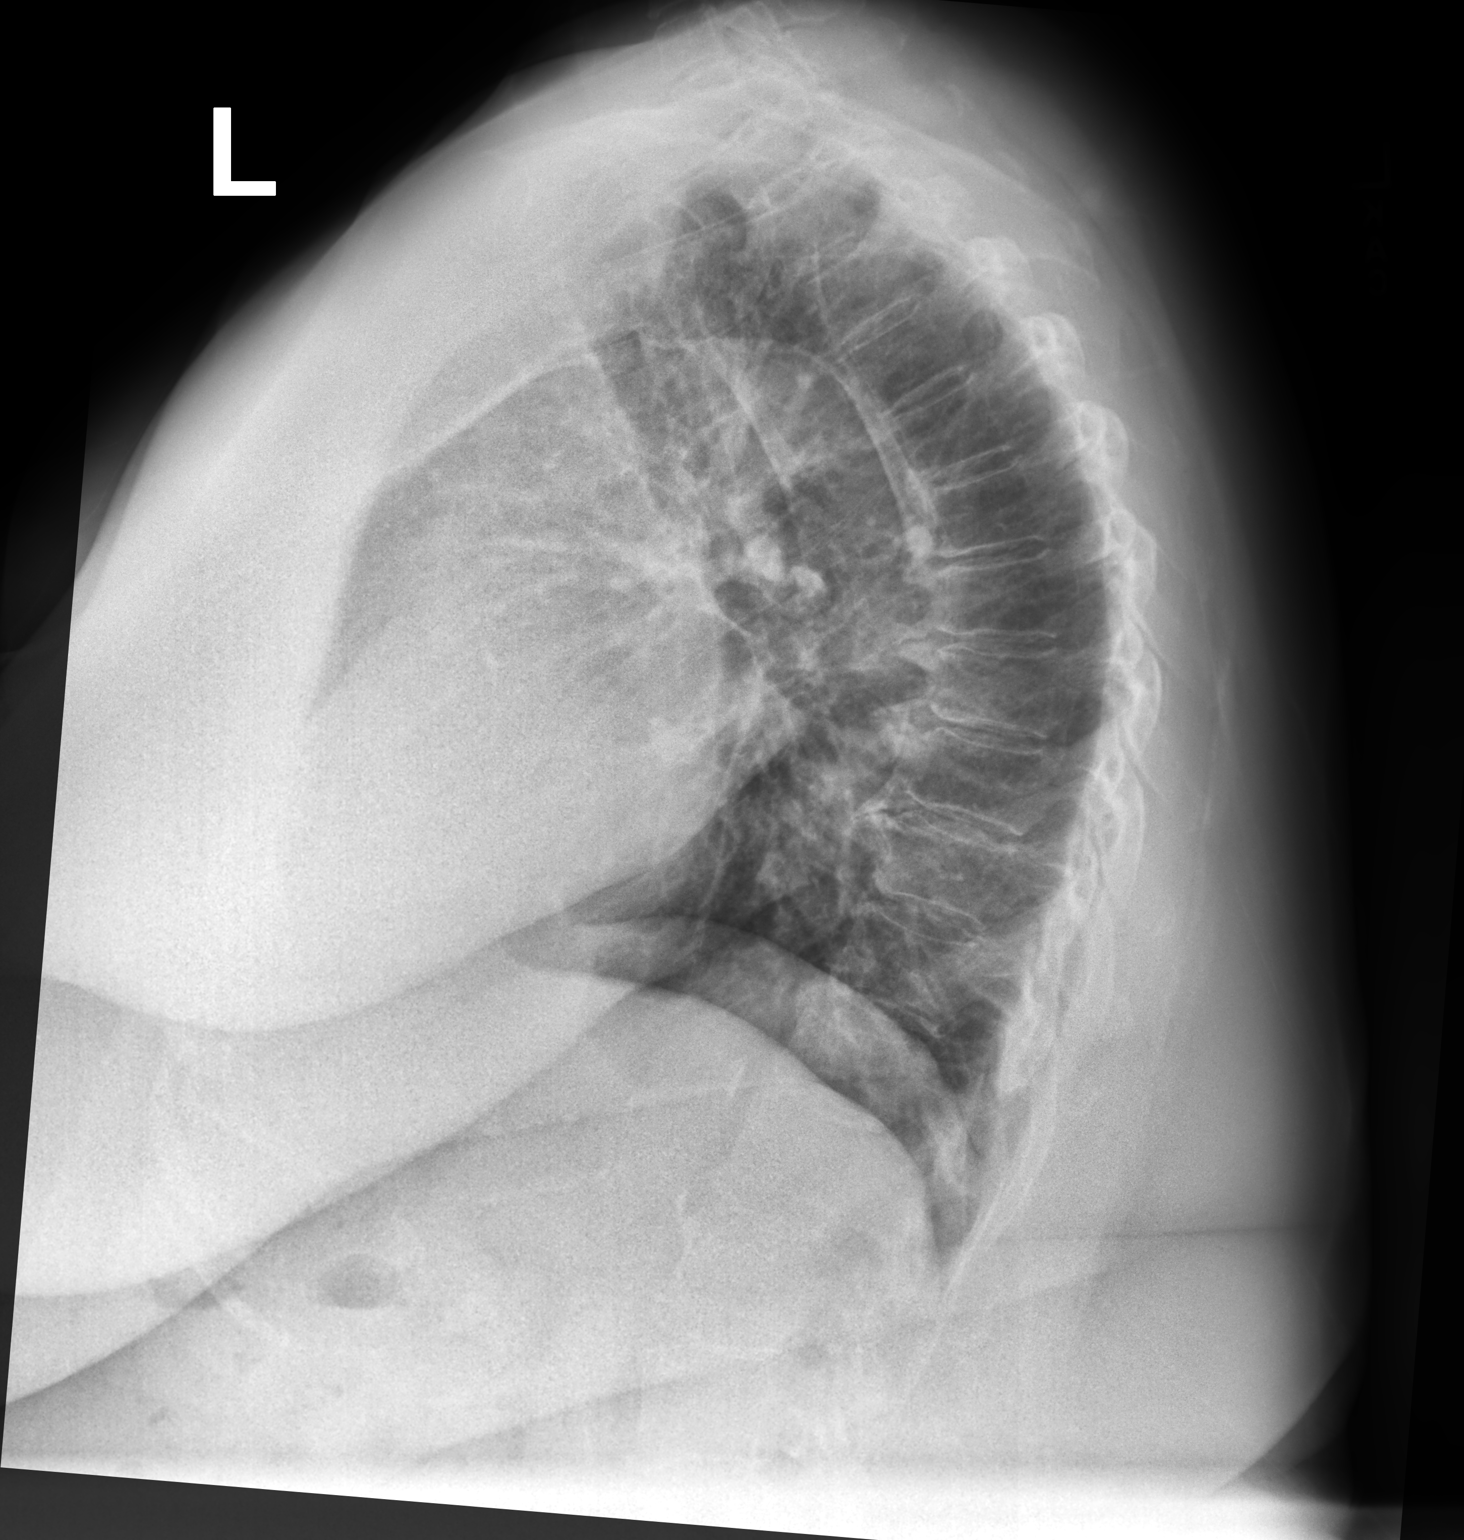

[2 of 2 positions shown; findings below may reference images not displayed]

FINDINGS: The heart size and mediastinal contours are within normal limits.
Atheromatous calcifications of the arch of the aorta. No focal
consolidation or pleural effusion. There are some reticulonodular
interstitial changes and prominent bronchovascular markings seen.
Moderate thoracic spondylosis. Apparent deformity of the left
humeral head.
IMPRESSION: Reticulonodular interstitial changes and prominent bronchovascular
markings. No focal consolidation or pleural effusion.

## 2022-12-11 ENCOUNTER — Ambulatory Visit: Payer: Self-pay | Admitting: *Deleted

## 2022-12-11 NOTE — Patient Outreach (Signed)
  Care Coordination   Follow Up Visit Note   12/11/2022 Name: Lindsay Snyder MRN: [id number] DOB: 1945-05-17  Lindsay Snyder is a 77 y.o. year old female who sees Doreene Nest, NP for primary care. I spoke with  Lindsay Snyder daughter by phone today.  What matters to the patients health and wellness today?  Memory care placement     Goals Addressed             This Visit's Progress    Memory care placement       Interventions Today    Flowsheet Row Most Recent Value  Chronic Disease   Chronic disease during today's visit Other  [dementia]  General Interventions   General Interventions Discussed/Reviewed General Interventions Reviewed, Level of Care  [Needs assessment completed-Confirmed that patient will transiton to [facility]F on 12/18/22]  Level of Care Assisted Living  [confirmed plan transition to [facility] on 12/18/22-pt's nephew will be traveling from [place] to assist with transtion and to provide emotional support]  Applications Medicaid  [Special Assistance Medicaid process initiated-per daugher she will need to inform DSS when patient is admitted to complete the process]  Education Interventions   Applications Medicaid  [Special Assistance Medicaid process initiated-per daugher she will need to inform DSS when patient is admitted to complete the process]  Mental Health Interventions   Mental Health Discussed/Reviewed Mental Health Reviewed, Coping Strategies  [caregiver stress acknowledged-discussed transition process strategies]              SDOH assessments and interventions completed:  No     Care Coordination Interventions:  Yes, provided   Follow up plan: Follow up call scheduled for 12/28/22    Encounter Outcome:  Patient Visit Completed

## 2022-12-11 NOTE — Patient Instructions (Signed)
Visit Information  Thank you for taking time to visit with me today. Please don't hesitate to contact me if I can be of assistance to you.   Following are the goals we discussed today:  Patient to transition to [facility] assisted living on 12/18/22 Patient's daughter to contact this social worker with any assistance needs with transition to ALF Patient's daughter to follow up with patient's doctor regarding in questions regarding her medical care   Our next appointment is by telephone on 12/26/22 at 11am  Please call the care guide team at [phone] if you need to cancel or reschedule your appointment.   If you are experiencing a Mental Health or Behavioral Health Crisis or need someone to talk to, please call 911   Patient verbalizes understanding of instructions and care plan provided today and agrees to view in MyChart. Active MyChart status and patient understanding of how to access instructions and care plan via MyChart confirmed with patient.     Telephone follow up appointment with care management team member scheduled for: 12/26/22  [name], LCSW [place]  [facility], [facility] Health Licensed Clinical Social Worker Care Coordinator  Direct Dial: [phone]

## 2022-12-24 DIAGNOSIS — G4701 Insomnia due to medical condition: Secondary | ICD-10-CM | POA: Diagnosis not present

## 2022-12-24 DIAGNOSIS — Z79899 Other long term (current) drug therapy: Secondary | ICD-10-CM | POA: Diagnosis not present

## 2022-12-24 DIAGNOSIS — G300 Alzheimer's disease with early onset: Secondary | ICD-10-CM | POA: Diagnosis not present

## 2022-12-24 DIAGNOSIS — M79604 Pain in right leg: Secondary | ICD-10-CM | POA: Diagnosis not present

## 2022-12-24 DIAGNOSIS — Z131 Encounter for screening for diabetes mellitus: Secondary | ICD-10-CM | POA: Diagnosis not present

## 2022-12-24 DIAGNOSIS — M79605 Pain in left leg: Secondary | ICD-10-CM | POA: Diagnosis not present

## 2022-12-24 DIAGNOSIS — R2681 Unsteadiness on feet: Secondary | ICD-10-CM | POA: Diagnosis not present

## 2022-12-24 DIAGNOSIS — F02B18 Dementia in other diseases classified elsewhere, moderate, with other behavioral disturbance: Secondary | ICD-10-CM | POA: Diagnosis not present

## 2022-12-26 DIAGNOSIS — D519 Vitamin B12 deficiency anemia, unspecified: Secondary | ICD-10-CM | POA: Diagnosis not present

## 2022-12-26 DIAGNOSIS — G300 Alzheimer's disease with early onset: Secondary | ICD-10-CM | POA: Diagnosis not present

## 2022-12-26 DIAGNOSIS — Z79899 Other long term (current) drug therapy: Secondary | ICD-10-CM | POA: Diagnosis not present

## 2022-12-28 ENCOUNTER — Ambulatory Visit: Payer: Self-pay | Admitting: *Deleted

## 2022-12-28 ENCOUNTER — Telehealth: Payer: Self-pay | Admitting: *Deleted

## 2022-12-28 NOTE — Patient Outreach (Signed)
  Care Coordination   Follow Up Visit Note   12/28/2022 Name: Lindsay Snyder MRN: [id number] DOB: 25-Jan-1946  Lindsay Snyder is a 77 y.o. year old female who sees Doreene Nest, NP for primary care. I spoke with  Oletta Cohn 's daughter by phone today.  What matters to the patients health and wellness today?  Patient ha now transitioned to [facility] assisted living. Per patient's daughter, patient transitioning well and is beginning to settle in.  Per patient's daughter, facility suggested that she not visit for 2 weeks to assist with the transition. Patient's daughter to visit on 01/01/23. Encouraged patient's daughter to  contact the nurses station for phone number in the event she cannot reach patient by cell phone. No further needs identified at this time.   Goals Addressed             This Visit's Progress    COMPLETED: Memory care placement       Interventions Today    Flowsheet Row Most Recent Value  Chronic Disease   Chronic disease during today's visit Other  [dementia]  General Interventions   General Interventions Discussed/Reviewed General Interventions Reviewed, Level of Care  [Needs assessment completed-Confirmed that patient will transiton to [facility] on 12/18/22]  Level of Care Assisted Living  [confirmed plan transition to [facility] on 12/18/22-pt's nephew will be traveling from [place] to assist with transtion and to provide emotional support]  Applications Medicaid  [Special Assistance Medicaid process initiated-per daugher she will need to inform DSS when patient is admitted to complete the process]  Education Interventions   Applications Medicaid  [Special Assistance Medicaid process initiated-per daugher she will need to inform DSS when patient is admitted to complete the process]  Mental Health Interventions   Mental Health Discussed/Reviewed Mental Health Reviewed, Coping Strategies  [caregiver stress acknowledged-discussed transition  process strategies]              SDOH assessments and interventions completed:  No     Care Coordination Interventions:  Yes, provided   Follow up plan: No further intervention required.   Encounter Outcome:  Patient Visit Completed

## 2022-12-28 NOTE — Patient Outreach (Signed)
  Care Coordination   12/28/2022 Name: Lindsay Snyder MRN: [id number] DOB: 1945/09/14   Care Coordination Outreach Attempts:  An unsuccessful telephone outreach was attempted today to offer the patient information about available care coordination services.  Follow Up Plan:  Additional outreach attempts will be made to offer the patient care coordination information and services.   Encounter Outcome:  No Answer   Care Coordination Interventions:  No, not indicated    Roderic Lammert, LCSW [place]  [facility], [facility] Health Licensed Clinical Social Worker Care Coordinator  Direct Dial: [phone]

## 2022-12-28 NOTE — Patient Instructions (Signed)
Visit Information  Thank you for taking time to visit with me today. Please don't hesitate to contact me if I can be of assistance to you.   Following are the goals we discussed today:  Please call this social worker with any questions or concerns regarding patient's placement in memory care.    If you are experiencing a Mental Health or Behavioral Health Crisis or need someone to talk to, please call 911   Patient verbalizes understanding of instructions and care plan provided today and agrees to view in MyChart. Active MyChart status and patient understanding of how to access instructions and care plan via MyChart confirmed with patient.     No further follow up required: patient now in Memory Care at the [facility], [place]  [facility], [facility] Health Licensed Clinical Social Worker Care Coordinator  Direct Dial: [phone]

## 2023-01-01 ENCOUNTER — Emergency Department: Payer: Medicare HMO

## 2023-01-01 ENCOUNTER — Other Ambulatory Visit: Payer: Self-pay

## 2023-01-01 ENCOUNTER — Inpatient Hospital Stay
Admission: EM | Admit: 2023-01-01 | Discharge: 2023-01-05 | DRG: 689 | Disposition: A | Discharge status: Skilled Nursing Facility | Payer: Medicare HMO | Source: Skilled Nursing Facility | Attending: Internal Medicine | Admitting: Internal Medicine

## 2023-01-01 DIAGNOSIS — I2510 Atherosclerotic heart disease of native coronary artery without angina pectoris: Secondary | ICD-10-CM | POA: Diagnosis present

## 2023-01-01 DIAGNOSIS — R4701 Aphasia: Secondary | ICD-10-CM | POA: Diagnosis present

## 2023-01-01 DIAGNOSIS — Z79899 Other long term (current) drug therapy: Secondary | ICD-10-CM

## 2023-01-01 DIAGNOSIS — G309 Alzheimer's disease, unspecified: Secondary | ICD-10-CM | POA: Diagnosis present

## 2023-01-01 DIAGNOSIS — G9341 Metabolic encephalopathy: Secondary | ICD-10-CM | POA: Diagnosis present

## 2023-01-01 DIAGNOSIS — I5A Non-ischemic myocardial injury (non-traumatic): Secondary | ICD-10-CM | POA: Diagnosis present

## 2023-01-01 DIAGNOSIS — G9340 Encephalopathy, unspecified: Secondary | ICD-10-CM | POA: Diagnosis present

## 2023-01-01 DIAGNOSIS — R531 Weakness: Secondary | ICD-10-CM | POA: Diagnosis not present

## 2023-01-01 DIAGNOSIS — F32A Depression, unspecified: Secondary | ICD-10-CM | POA: Diagnosis present

## 2023-01-01 DIAGNOSIS — I2489 Other forms of acute ischemic heart disease: Secondary | ICD-10-CM | POA: Diagnosis present

## 2023-01-01 DIAGNOSIS — Z6841 Body Mass Index (BMI) 40.0 and over, adult: Secondary | ICD-10-CM

## 2023-01-01 DIAGNOSIS — N390 Urinary tract infection, site not specified: Principal | ICD-10-CM | POA: Diagnosis present

## 2023-01-01 DIAGNOSIS — S199XXA Unspecified injury of neck, initial encounter: Secondary | ICD-10-CM | POA: Diagnosis not present

## 2023-01-01 DIAGNOSIS — K219 Gastro-esophageal reflux disease without esophagitis: Secondary | ICD-10-CM | POA: Diagnosis present

## 2023-01-01 DIAGNOSIS — M6282 Rhabdomyolysis: Secondary | ICD-10-CM | POA: Diagnosis present

## 2023-01-01 DIAGNOSIS — Z8249 Family history of ischemic heart disease and other diseases of the circulatory system: Secondary | ICD-10-CM

## 2023-01-01 DIAGNOSIS — Z833 Family history of diabetes mellitus: Secondary | ICD-10-CM | POA: Diagnosis not present

## 2023-01-01 DIAGNOSIS — R918 Other nonspecific abnormal finding of lung field: Secondary | ICD-10-CM | POA: Diagnosis not present

## 2023-01-01 DIAGNOSIS — S0990XA Unspecified injury of head, initial encounter: Secondary | ICD-10-CM | POA: Diagnosis not present

## 2023-01-01 DIAGNOSIS — I6782 Cerebral ischemia: Secondary | ICD-10-CM | POA: Diagnosis not present

## 2023-01-01 DIAGNOSIS — W19XXXA Unspecified fall, initial encounter: Secondary | ICD-10-CM | POA: Diagnosis present

## 2023-01-01 DIAGNOSIS — F0280 Dementia in other diseases classified elsewhere without behavioral disturbance: Secondary | ICD-10-CM | POA: Diagnosis present

## 2023-01-01 DIAGNOSIS — I340 Nonrheumatic mitral (valve) insufficiency: Secondary | ICD-10-CM | POA: Diagnosis not present

## 2023-01-01 DIAGNOSIS — Y929 Unspecified place or not applicable: Secondary | ICD-10-CM | POA: Diagnosis not present

## 2023-01-01 DIAGNOSIS — G459 Transient cerebral ischemic attack, unspecified: Secondary | ICD-10-CM | POA: Diagnosis not present

## 2023-01-01 DIAGNOSIS — R4182 Altered mental status, unspecified: Secondary | ICD-10-CM | POA: Diagnosis not present

## 2023-01-01 DIAGNOSIS — Z1152 Encounter for screening for COVID-19: Secondary | ICD-10-CM | POA: Diagnosis not present

## 2023-01-01 DIAGNOSIS — A0472 Enterocolitis due to Clostridium difficile, not specified as recurrent: Secondary | ICD-10-CM | POA: Diagnosis present

## 2023-01-01 DIAGNOSIS — E876 Hypokalemia: Secondary | ICD-10-CM | POA: Diagnosis present

## 2023-01-01 DIAGNOSIS — R9089 Other abnormal findings on diagnostic imaging of central nervous system: Secondary | ICD-10-CM | POA: Diagnosis not present

## 2023-01-01 DIAGNOSIS — R0989 Other specified symptoms and signs involving the circulatory and respiratory systems: Secondary | ICD-10-CM | POA: Diagnosis not present

## 2023-01-01 DIAGNOSIS — F419 Anxiety disorder, unspecified: Secondary | ICD-10-CM | POA: Diagnosis present

## 2023-01-01 DIAGNOSIS — R059 Cough, unspecified: Secondary | ICD-10-CM | POA: Diagnosis not present

## 2023-01-01 DIAGNOSIS — R7989 Other specified abnormal findings of blood chemistry: Secondary | ICD-10-CM | POA: Diagnosis not present

## 2023-01-01 DIAGNOSIS — R413 Other amnesia: Secondary | ICD-10-CM

## 2023-01-01 DIAGNOSIS — Z888 Allergy status to other drugs, medicaments and biological substances status: Secondary | ICD-10-CM

## 2023-01-01 DIAGNOSIS — R9431 Abnormal electrocardiogram [ECG] [EKG]: Secondary | ICD-10-CM | POA: Diagnosis not present

## 2023-01-01 LAB — COMPREHENSIVE METABOLIC PANEL
ALT: 42 U/L (ref 0–44)
AST: 74 U/L — ABNORMAL HIGH (ref 15–41)
Albumin: 3.5 g/dL (ref 3.5–5.0)
Alkaline Phosphatase: 52 U/L (ref 38–126)
Anion gap: 10 (ref 5–15)
BUN: 15 mg/dL (ref 8–23)
CO2: 23 mmol/L (ref 22–32)
Calcium: 8.5 mg/dL — ABNORMAL LOW (ref 8.9–10.3)
Chloride: 103 mmol/L (ref 98–111)
Creatinine, Ser: 1.02 mg/dL — ABNORMAL HIGH (ref 0.44–1.00)
GFR, Estimated: 56 mL/min — ABNORMAL LOW (ref >60)
Glucose, Bld: 127 mg/dL — ABNORMAL HIGH (ref 70–99)
Potassium: 3 mmol/L — ABNORMAL LOW (ref 3.5–5.1)
Sodium: 136 mmol/L (ref 135–145)
Total Bilirubin: 1.3 mg/dL — ABNORMAL HIGH (ref <1.2)
Total Protein: 6.5 g/dL (ref 6.5–8.1)

## 2023-01-01 LAB — URINE DRUG SCREEN, QUALITATIVE (ARMC ONLY)
Amphetamines, Ur Screen: NOT DETECTED
Barbiturates, Ur Screen: NOT DETECTED
Benzodiazepine, Ur Scrn: NOT DETECTED
Cannabinoid 50 Ng, Ur ~~LOC~~: NOT DETECTED
Cocaine Metabolite,Ur ~~LOC~~: NOT DETECTED
MDMA (Ecstasy)Ur Screen: NOT DETECTED
Methadone Scn, Ur: NOT DETECTED
Opiate, Ur Screen: NOT DETECTED
Phencyclidine (PCP) Ur S: NOT DETECTED
Tricyclic, Ur Screen: NOT DETECTED

## 2023-01-01 LAB — CBC WITH DIFFERENTIAL/PLATELET
Abs Immature Granulocytes: 0.08 10*3/uL — ABNORMAL HIGH (ref 0.00–0.07)
Basophils Absolute: 0 10*3/uL (ref 0.0–0.1)
Basophils Relative: 0 %
Eosinophils Absolute: 0.2 10*3/uL (ref 0.0–0.5)
Eosinophils Relative: 1 %
HCT: 41.8 % (ref 36.0–46.0)
Hemoglobin: 13.8 g/dL (ref 12.0–15.0)
Immature Granulocytes: 1 %
Lymphocytes Relative: 10 %
Lymphs Abs: 1.2 10*3/uL (ref 0.7–4.0)
MCH: 29.4 pg (ref 26.0–34.0)
MCHC: 33 g/dL (ref 30.0–36.0)
MCV: 88.9 fL (ref 80.0–100.0)
Monocytes Absolute: 1.1 10*3/uL — ABNORMAL HIGH (ref 0.1–1.0)
Monocytes Relative: 9 %
Neutro Abs: 10.1 10*3/uL — ABNORMAL HIGH (ref 1.7–7.7)
Neutrophils Relative %: 79 %
Platelets: 248 10*3/uL (ref 150–400)
RBC: 4.7 MIL/uL (ref 3.87–5.11)
RDW: 12.8 % (ref 11.5–15.5)
WBC: 12.6 10*3/uL — ABNORMAL HIGH (ref 4.0–10.5)
nRBC: 0 % (ref 0.0–0.2)

## 2023-01-01 LAB — URINALYSIS, ROUTINE W REFLEX MICROSCOPIC
Bilirubin Urine: NEGATIVE
Glucose, UA: NEGATIVE mg/dL
Ketones, ur: 5 mg/dL — AB
Nitrite: POSITIVE — AB
Protein, ur: NEGATIVE mg/dL
Specific Gravity, Urine: 1.009 (ref 1.005–1.030)
WBC, UA: >50 WBC/hpf (ref 0–5)
pH: 6 (ref 5.0–8.0)

## 2023-01-01 LAB — RESP PANEL BY RT-PCR (RSV, FLU A&B, COVID)  RVPGX2
Influenza A by PCR: NEGATIVE
Influenza B by PCR: NEGATIVE
Resp Syncytial Virus by PCR: NEGATIVE
SARS Coronavirus 2 by RT PCR: NEGATIVE

## 2023-01-01 LAB — TROPONIN I (HIGH SENSITIVITY)
Troponin I (High Sensitivity): 43 ng/L — ABNORMAL HIGH (ref <18)
Troponin I (High Sensitivity): 107 ng/L (ref <18)
Troponin I (High Sensitivity): 70 ng/L — ABNORMAL HIGH (ref <18)

## 2023-01-01 LAB — VITAMIN B12: Vitamin B-12: 959 pg/mL — ABNORMAL HIGH (ref 180–914)

## 2023-01-01 LAB — GLUCOSE, CAPILLARY: Glucose-Capillary: 83 mg/dL (ref 70–99)

## 2023-01-01 LAB — AMMONIA: Ammonia: 12 umol/L (ref 9–35)

## 2023-01-01 LAB — PROCALCITONIN: Procalcitonin: <0.1 ng/mL

## 2023-01-01 LAB — LITHIUM LEVEL: Lithium Lvl: <0.06 mmol/L — ABNORMAL LOW (ref 0.60–1.20)

## 2023-01-01 LAB — TSH: TSH: 2.085 u[IU]/mL (ref 0.350–4.500)

## 2023-01-01 LAB — MAGNESIUM: Magnesium: 2.1 mg/dL (ref 1.7–2.4)

## 2023-01-01 LAB — LACTIC ACID, PLASMA: Lactic Acid, Venous: 1.3 mmol/L (ref 0.5–1.9)

## 2023-01-01 LAB — CK: Total CK: 1988 U/L — ABNORMAL HIGH (ref 38–234)

## 2023-01-01 MED ORDER — DICLOFENAC SODIUM 1 % EX GEL
4.0000 g | Freq: Three times a day (TID) | CUTANEOUS | Status: DC | PRN
Start: 2023-01-01 — End: 2023-01-05

## 2023-01-01 MED ORDER — POTASSIUM CHLORIDE 10 MEQ/100ML IV SOLN
10.0000 meq | INTRAVENOUS | Status: AC
  Administered 2023-01-01 (×2): 10 meq via INTRAVENOUS
  Filled 2023-01-01 (×2): qty 100

## 2023-01-01 MED ORDER — TRAZODONE HCL 50 MG PO TABS
100.0000 mg | ORAL_TABLET | Freq: Every day | ORAL | Status: DC
  Administered 2023-01-02 – 2023-01-04 (×3): 100 mg via ORAL
  Filled 2023-01-01 (×4): qty 2

## 2023-01-01 MED ORDER — IBUPROFEN 400 MG PO TABS
400.0000 mg | ORAL_TABLET | Freq: Four times a day (QID) | ORAL | Status: DC | PRN
  Administered 2023-01-01: 400 mg via ORAL
  Filled 2023-01-01: qty 1

## 2023-01-01 MED ORDER — OXYBUTYNIN CHLORIDE ER 5 MG PO TB24
15.0000 mg | ORAL_TABLET | Freq: Every day | ORAL | Status: DC
  Administered 2023-01-02: 15 mg via ORAL
  Filled 2023-01-01 (×2): qty 1

## 2023-01-01 MED ORDER — SODIUM CHLORIDE 0.9 % IV SOLN
2.0000 g | INTRAVENOUS | Status: DC
  Administered 2023-01-01: 2 g via INTRAVENOUS
  Filled 2023-01-01: qty 20

## 2023-01-01 MED ORDER — LORATADINE 10 MG PO TABS
10.0000 mg | ORAL_TABLET | Freq: Every day | ORAL | Status: DC
  Administered 2023-01-02 – 2023-01-05 (×4): 10 mg via ORAL
  Filled 2023-01-01 (×5): qty 1

## 2023-01-01 MED ORDER — MEMANTINE HCL 5 MG PO TABS
10.0000 mg | ORAL_TABLET | Freq: Every day | ORAL | Status: DC
  Administered 2023-01-02 – 2023-01-05 (×4): 10 mg via ORAL
  Filled 2023-01-01 (×4): qty 2

## 2023-01-01 MED ORDER — RISAQUAD PO CAPS
2.0000 | ORAL_CAPSULE | Freq: Three times a day (TID) | ORAL | Status: DC
  Administered 2023-01-01 – 2023-01-05 (×11): 2 via ORAL
  Filled 2023-01-01 (×12): qty 2

## 2023-01-01 MED ORDER — ENOXAPARIN SODIUM 60 MG/0.6ML IJ SOSY
50.0000 mg | PREFILLED_SYRINGE | INTRAMUSCULAR | Status: DC
  Administered 2023-01-01 – 2023-01-04 (×4): 50 mg via SUBCUTANEOUS
  Filled 2023-01-01 (×4): qty 0.6

## 2023-01-01 MED ORDER — SODIUM CHLORIDE 0.9 % IV SOLN: INTRAVENOUS | Status: DC

## 2023-01-01 MED ORDER — MEMANTINE HCL 5 MG PO TABS
10.0000 mg | ORAL_TABLET | Freq: Every day | ORAL | Status: DC
  Filled 2023-01-01: qty 2

## 2023-01-01 MED ORDER — DULOXETINE HCL 20 MG PO CPEP
20.0000 mg | ORAL_CAPSULE | Freq: Every day | ORAL | Status: DC
  Administered 2023-01-02 – 2023-01-05 (×4): 20 mg via ORAL
  Filled 2023-01-01 (×5): qty 1

## 2023-01-01 MED ORDER — MIRTAZAPINE 15 MG PO TABS
15.0000 mg | ORAL_TABLET | Freq: Every day | ORAL | Status: DC
Start: 2023-01-01 — End: 2023-01-05
  Administered 2023-01-02 – 2023-01-04 (×3): 15 mg via ORAL
  Filled 2023-01-01 (×5): qty 1

## 2023-01-01 MED ORDER — DONEPEZIL HCL 5 MG PO TABS: 5.0000 mg | ORAL_TABLET | Freq: Every day | ORAL | Status: DC

## 2023-01-01 MED ORDER — ONDANSETRON HCL 4 MG/2ML IJ SOLN: 4.0000 mg | Freq: Four times a day (QID) | INTRAMUSCULAR | Status: DC | PRN

## 2023-01-01 MED ORDER — SODIUM CHLORIDE 0.9 % IV SOLN
2.0000 g | Freq: Two times a day (BID) | INTRAVENOUS | Status: DC
  Administered 2023-01-01 – 2023-01-02 (×2): 2 g via INTRAVENOUS
  Filled 2023-01-01 (×5): qty 12.5

## 2023-01-01 MED ORDER — DONEPEZIL HCL 5 MG PO TABS
5.0000 mg | ORAL_TABLET | Freq: Every day | ORAL | Status: DC
  Administered 2023-01-02 – 2023-01-04 (×3): 5 mg via ORAL
  Filled 2023-01-01 (×3): qty 1

## 2023-01-01 MED ORDER — LITHIUM CARBONATE 150 MG PO CAPS
150.0000 mg | ORAL_CAPSULE | Freq: Every day | ORAL | Status: DC
  Administered 2023-01-02 – 2023-01-05 (×4): 150 mg via ORAL
  Filled 2023-01-01 (×5): qty 1

## 2023-01-01 MED ORDER — SODIUM CHLORIDE 0.9 % IV BOLUS (SEPSIS)
1000.0000 mL | Freq: Once | INTRAVENOUS | Status: AC
Start: 2023-01-01 — End: 2023-01-01
  Administered 2023-01-01: 1000 mL via INTRAVENOUS

## 2023-01-01 NOTE — ED Notes (Signed)
PT family came to visit and family updated by this RN.

## 2023-01-01 NOTE — H&P (Signed)
History and Physical    Lindsay Snyder HCW:[id number] DOB: 1945/10/24 DOA: 01/01/2023  PCP: Doreene Nest, NP (Confirm with patient/family/NH records and if not entered, this has to be entered at [facility] point of entry) Patient coming from: Assisted living  I have personally briefly reviewed patient's old medical records in [facility]  Chief Complaint: Patient is confused  HPI: Lindsay Snyder is a 77 y.o. female with medical history significant of Alzheimer's dementia, frequent UTIs, C. difficile colitis, anxiety/depression, sent from the facility for evaluation of fall and altered mentations.  Patient became more confused with slurred speech which raised concern about recurrent UTI as part of her UTI symptoms.  Recognized by facility staff, patient was started on p.o. antibiotics 2 days ago.  However according to the family patient's mentation remained pretty much the same and this morning patient was found on the floor confused.  At this point, patient is lethargic but easily arousable, but her speech is slurred and incomprehensible.  Patient denied any pain. ED Course: Afebrile, nontachycardic nonhypotensive nonhypoxic.  Trauma scan including CT head and neck negative for dislocation or fracture.  Blood work found CK19 98, lactic acid 1.3, WBC 12.6 hemoglobin 13.8 K3.0, creatinine 1.0.  Troponin 43> 107.  EKG showed normal sinus rhythm, no acute ST changes.  Review of Systems: Unable to perform, patient is confused.  Past Medical History:  Diagnosis Date   Allergy 010150   Anxiety 08/30/2019   Appendicitis 8    C. difficile diarrhea 02/04/2022   Cancer [facility])    daughter states patient never had cancer   Dementia (HCC)    GERD (gastroesophageal reflux disease) 01/30/2019   Otitis externa 10/14/2020   Polyp of colon    Benign    Past Surgical History:  Procedure Laterality Date   ABDOMINAL HYSTERECTOMY     APPENDECTOMY     BREAST EXCISIONAL BIOPSY Right    + 20 years neg    COLONOSCOPY WITH PROPOFOL N/A 12/16/2017   Procedure: COLONOSCOPY WITH PROPOFOL;  Surgeon: Wyline Mood, MD;  Location: [facility] ENDOSCOPY;  Service: Gastroenterology;  Laterality: N/A;   COLONOSCOPY WITH PROPOFOL N/A 01/02/2021   Procedure: COLONOSCOPY WITH PROPOFOL;  Surgeon: Wyline Mood, MD;  Location: [facility] ENDOSCOPY;  Service: Gastroenterology;  Laterality: N/A;     reports that she has never smoked. She has never used smokeless tobacco. She reports that she does not drink alcohol and does not use drugs.  Allergies  Allergen Reactions   Crestor [Rosuvastatin Calcium] Rash and Other (See Comments)    Myalgia   Tylenol [Acetaminophen] Rash    Family History  Problem Relation Age of Onset   Diabetes Mother        Deceased   Heart attack Father 8       Deceased   Cancer Father        Colon   Breast cancer Paternal Aunt      Prior to Admission medications   Medication Sig Start Date End Date Taking? Authorizing Provider  cetirizine (ZYRTEC) 10 MG tablet Take 10 mg by mouth daily.   Yes [provider]  diclofenac Sodium (VOLTAREN) 1 % GEL Apply 4 g topically 3 (three) times daily as needed (pain). (Apply to affected area of legs) 12/24/22  Yes [provider]  donepezil (ARICEPT) 5 MG tablet Take 5 mg by mouth at bedtime. 09/29/20  Yes [provider]  DULoxetine (CYMBALTA) 20 MG capsule TAKE 2 CAPSULES(40 MG) BY MOUTH DAILY FOR ANXIETY  08/26/22  Yes Doreene Nest, NP  lithium carbonate 150 MG capsule Take 150 mg by mouth daily. 12/01/21  Yes [provider]  memantine (NAMENDA) 5 MG tablet Take 2 tablets (10 mg total) by mouth daily. For memory 11/29/22  Yes Doreene Nest, NP  mirtazapine (REMERON) 15 MG tablet Take 15 mg by mouth at bedtime.   Yes [provider]  ondansetron (ZOFRAN-ODT) 4 MG disintegrating tablet Take 1 tablet (4 mg total) by mouth every 8 (eight) hours as needed for nausea or vomiting. 02/09/22  Yes Doreene Nest, NP  oxybutynin (DITROPAN XL) 15 MG 24 hr tablet TAKE 1 TABLET(15 MG) BY MOUTH AT BEDTIME FOR OVERACTIVE BLADDER 07/10/22  Yes Doreene Nest, NP  traZODone (DESYREL) 100 MG tablet Take 100 mg by mouth at bedtime. 12/01/21  Yes [provider]    Physical Exam: Vitals:   01/01/23 0530 01/01/23 0606 01/01/23 0700 01/01/23 1044  BP: 134/67  (!) 150/62   Pulse: 80  70   Resp: 18  12   Temp:  98.4 F (36.9 C)  98 F (36.7 C)  TempSrc:  Rectal  Oral  SpO2: 91%  95%   Weight:      Height:        Constitutional: NAD, calm, comfortable Vitals:   01/01/23 0530 01/01/23 0606 01/01/23 0700 01/01/23 1044  BP: 134/67  (!) 150/62   Pulse: 80  70   Resp: 18  12   Temp:  98.4 F (36.9 C)  98 F (36.7 C)  TempSrc:  Rectal  Oral  SpO2: 91%  95%   Weight:      Height:       Eyes: PERRL, lids and conjunctivae normal ENMT: Mucous membranes are moist. Posterior pharynx clear of any exudate or lesions.Normal dentition.  Neck: normal, supple, no masses, no thyromegaly Respiratory: clear to auscultation bilaterally, no wheezing, no crackles. Normal respiratory effort. No accessory muscle use.  Cardiovascular: Regular rate and rhythm, no murmurs / rubs / gallops. No extremity edema. 2+ pedal pulses. No carotid bruits.  Abdomen: no tenderness, no masses palpated. No hepatosplenomegaly. Bowel sounds positive.  Musculoskeletal: no clubbing / cyanosis. No joint deformity upper and lower extremities. Good ROM, no contractures. Normal muscle tone.  Skin: no rashes, lesions, ulcers. No induration Neurologic: No facial droops, moving all limbs, slurred speech Psychiatric: Asleep, easily arousable, confused    Labs on Admission: I have personally reviewed following labs and imaging studies  CBC: Recent Labs  Lab 01/01/23 0112  WBC 12.6*  NEUTROABS 10.1*  HGB 13.8  HCT 41.8  MCV 88.9  PLT 248   Basic Metabolic Panel: Recent Labs  Lab 01/01/23 0112  NA 136  K 3.0*   CL 103  CO2 23  GLUCOSE 127*  BUN 15  CREATININE 1.02*  CALCIUM 8.5*  MG 2.1   GFR: Estimated Creatinine Clearance: 52.2 mL/min (A) (by C-G formula based on SCr of 1.02 mg/dL (H)). Liver Function Tests: Recent Labs  Lab 01/01/23 0112  AST 74*  ALT 42  ALKPHOS 52  BILITOT 1.3*  PROT 6.5  ALBUMIN 3.5   No results for input(s): "LIPASE", "AMYLASE" in the last 168 hours. Recent Labs  Lab 01/01/23 0113  AMMONIA 12   Coagulation Profile: No results for input(s): "INR", "PROTIME" in the last 168 hours. Cardiac Enzymes: Recent Labs  Lab 01/01/23 0112  CKTOTAL 1,988*   BNP (last 3 results) Recent Labs    03/01/22 1006  PROBNP 36.0   HbA1C: No results for input(s): "HGBA1C" in the last 72 hours. CBG: No results for input(s): "GLUCAP" in the last 168 hours. Lipid Profile: No results for input(s): "CHOL", "HDL", "LDLCALC", "TRIG", "CHOLHDL", "LDLDIRECT" in the last 72 hours. Thyroid Function Tests: Recent Labs    01/01/23 0112  TSH 2.085   Anemia Panel: No results for input(s): "VITAMINB12", "FOLATE", "FERRITIN", "TIBC", "IRON", "RETICCTPCT" in the last 72 hours. Urine analysis:    Component Value Date/Time   COLORURINE YELLOW (A) 01/01/2023 0318   APPEARANCEUR HAZY (A) 01/01/2023 0318   LABSPEC 1.009 01/01/2023 0318   PHURINE 6.0 01/01/2023 0318   GLUCOSEU NEGATIVE 01/01/2023 0318   GLUCOSEU NEGATIVE 09/25/2022 1310   HGBUR MODERATE (A) 01/01/2023 0318   BILIRUBINUR NEGATIVE 01/01/2023 0318   BILIRUBINUR neg 09/06/2022 1209   KETONESUR 5 (A) 01/01/2023 0318   PROTEINUR NEGATIVE 01/01/2023 0318   UROBILINOGEN 0.2 09/25/2022 1310   NITRITE POSITIVE (A) 01/01/2023 0318   LEUKOCYTESUR LARGE (A) 01/01/2023 0318    Radiological Exams on Admission: DG Chest Portable 1 View  Result Date: 01/01/2023 CLINICAL DATA:  Altered mental status and cough. EXAM: PORTABLE CHEST 1 VIEW COMPARISON:  March 01, 2022 FINDINGS: The cardiac silhouette is borderline in size  which may be technical in origin. There is moderate severity calcification of the aortic arch. Low lung volumes are noted. Mild, diffuse, stable chronic appearing increased interstitial lung markings are seen. A chronic deformity is seen involving the head and neck of the proximal left humerus. Multilevel degenerative changes are seen throughout the thoracic spine. IMPRESSION: Low lung volumes with mild, diffuse, stable chronic appearing increased interstitial lung markings. Electronically Signed   By: Aram Candela M.D.   On: 01/01/2023 04:02   CT HEAD WO CONTRAST ( )  Result Date: 01/01/2023 CLINICAL DATA:  Altered mental status.  Head trauma. EXAM: CT HEAD WITHOUT CONTRAST CT CERVICAL SPINE WITHOUT CONTRAST TECHNIQUE: Multidetector CT imaging of the head and cervical spine was performed following the standard protocol without intravenous contrast. Multiplanar CT image reconstructions of the cervical spine were also generated. RADIATION DOSE REDUCTION: This exam was performed according to the departmental dose-optimization program which includes automated exposure control, adjustment of the mA and/or kV according to patient size and/or use of iterative reconstruction technique. COMPARISON:  01/14/2022 FINDINGS: CT HEAD FINDINGS Brain: There is no mass, hemorrhage or extra-axial collection. The size and configuration of the ventricles and extra-axial CSF spaces are normal. There is hypoattenuation of the periventricular white matter, most commonly indicating chronic ischemic microangiopathy. Vascular: No abnormal hyperdensity of the major intracranial arteries or dural venous sinuses. No intracranial atherosclerosis. Skull: The visualized skull base, calvarium and extracranial soft tissues are normal. Sinuses/Orbits: Right mastoid effusion.  The orbits are normal. CT CERVICAL SPINE FINDINGS Alignment: No static subluxation. Facets are aligned. Occipital condyles are normally positioned. Skull base and  vertebrae: No acute fracture. Soft tissues and spinal canal: No prevertebral fluid or swelling. No visible canal hematoma. Disc levels: No advanced spinal canal or neural foraminal stenosis. Upper chest: No pneumothorax, pulmonary nodule or pleural effusion. Other: The cervical spine portion of the examination is markedly degraded by motion. IMPRESSION: 1. No acute intracranial abnormality. 2. Chronic ischemic microangiopathy. 3. No acute fracture or static subluxation of the cervical spine, allowing for marked motion degradation. Electronically Signed   By: Deatra Robinson M.D.   On: 01/01/2023 03:50   CT Cervical Spine Wo Contrast  Result Date: 01/01/2023 CLINICAL DATA:  Altered mental status.  Head trauma. EXAM: CT HEAD WITHOUT CONTRAST CT CERVICAL SPINE WITHOUT CONTRAST TECHNIQUE: Multidetector CT imaging of the head and cervical spine was performed following the standard protocol without intravenous contrast. Multiplanar CT image reconstructions of the cervical spine were also generated. RADIATION DOSE REDUCTION: This exam was performed according to the departmental dose-optimization program which includes automated exposure control, adjustment of the mA and/or kV according to patient size and/or use of iterative reconstruction technique. COMPARISON:  01/14/2022 FINDINGS: CT HEAD FINDINGS Brain: There is no mass, hemorrhage or extra-axial collection. The size and configuration of the ventricles and extra-axial CSF spaces are normal. There is hypoattenuation of the periventricular white matter, most commonly indicating chronic ischemic microangiopathy. Vascular: No abnormal hyperdensity of the major intracranial arteries or dural venous sinuses. No intracranial atherosclerosis. Skull: The visualized skull base, calvarium and extracranial soft tissues are normal. Sinuses/Orbits: Right mastoid effusion.  The orbits are normal. CT CERVICAL SPINE FINDINGS Alignment: No static subluxation. Facets are aligned.  Occipital condyles are normally positioned. Skull base and vertebrae: No acute fracture. Soft tissues and spinal canal: No prevertebral fluid or swelling. No visible canal hematoma. Disc levels: No advanced spinal canal or neural foraminal stenosis. Upper chest: No pneumothorax, pulmonary nodule or pleural effusion. Other: The cervical spine portion of the examination is markedly degraded by motion. IMPRESSION: 1. No acute intracranial abnormality. 2. Chronic ischemic microangiopathy. 3. No acute fracture or static subluxation of the cervical spine, allowing for marked motion degradation. Electronically Signed   By: Deatra Robinson M.D.   On: 01/01/2023 03:50    EKG: Independently reviewed.  Sinus rhythm, no acute ST changes.  Assessment/Plan Principal Problem:   Acute encephalopathy Active Problems:   UTI (urinary tract infection)   Encephalopathy  (please populate well all problems here in Problem List. (For example, if patient is on BP meds at home and you resume or decide to hold them, it is a problem that needs to be her. Same for CAD, COPD, HLD and so on)  Acute metabolic encephalopathy Acute aphasia -Secondary UTI -According to facility staff and patient's daughter over the phone, patient tend to have mentation changes and slurred speech in her past episode of recurrent UTI which she has had 3 episodes this summer along.  However patient did not tolerate suppressive antibiotic treatment due to recurrent C. difficile colitis. -Will reevaluate her neurologic exam and speech in 24 hours, if no significant improvement, will consider further brain imaging such as MRI.  Explained to patient's daughter and son over the phone, both agreed with the plan.  UTI, recurrent -Reviewed the past 3 urine culture result in summer 2024 showed different organisms on each occasions, which includes Porteous, Enterobacter and Pseudomonas, all of them are pansensitive. -For now we will put patient on cefepime while  waiting for urine culture result -Start probiotics  Rhabdomyolysis -Nontraumatic, secondary to fall.  IV fluid, recheck CK level tomorrow  Elevated troponins -Denies any chest pain, repeat EKG showed no significant ST changes -Ordered echocardiogram -Differential likely secondary to rhabdomyolysis.  Doubt ACS.  But will trend troponin  Advanced dementia -Hold off memantine and Aricept today -Continue Remeron and lithium  DVT prophylaxis: Lovenox Code Status: Full code Family Communication: Son and daughter over the phone Disposition Plan: Patient is sick with UTI and acute encephalopathy and rhabdomyolysis requiring IV antibiotics IV fluid, expect more than 2 midnight hospital stay Consults called: None Admission status: Telemetry admission   Emeline General MD [facility] Pager [phone]  01/01/2023, 11:28  AM

## 2023-01-01 NOTE — Progress Notes (Signed)
PHARMACIST - PHYSICIAN COMMUNICATION  CONCERNING:  Enoxaparin (Lovenox) for DVT Prophylaxis    RECOMMENDATION: Patient was prescribed enoxaprin 40mg  q24 hours for VTE prophylaxis.   Filed Weights   01/01/23 0058  Weight: 106.7 kg (235 lb 4.8 oz)    Body mass index is 43.04 kg/m.  Estimated Creatinine Clearance: 52.2 mL/min (A) (by C-G formula based on SCr of 1.02 mg/dL (H)).   Based on [facility] policy patient is candidate for enoxaparin 0.5mg /kg TBW SQ every 24 hours based on BMI being >30.  DESCRIPTION: Pharmacy has adjusted enoxaparin dose per [facility] policy.  Patient is now receiving enoxaparin 50 mg every 24 hours    Barrie Folk, PharmD Clinical Pharmacist  01/01/2023 9:01 AM

## 2023-01-01 NOTE — ED Notes (Signed)
Tried giving pt their PO meds and the pt was not able to wake up enough to swallow water or understand the mechanics of swallowing of swallowing through a straw. Pt is exhibiting word salad language when asked questions. The first word makes sense and then she's off in left field. From the family in the room not being able to swallow is not normal behavior. The word salad speech is normal for her when she gets a UTI.

## 2023-01-01 NOTE — ED Notes (Signed)
[facility] given update on patient

## 2023-01-01 NOTE — Evaluation (Signed)
Physical Therapy Evaluation Patient Details Name: Lindsay Snyder MRN: [id number] DOB: 1945/07/23 Today's Date: 01/01/2023  History of Present Illness  Pt is a 77 y.o. female presenting to hospital 01/01/23 with c/o AMS and fall.  Pt admitted with acute metabolic encephalopathy, acute aphasia, recurrent UTI, rhabdomyolysis.  PMH includes anxiety, appendicitis, c-diff, dementia.  Clinical Impression  Prior to recent medical concerns, per chart pt was ambulatory without assistance; lives at [facility].  Pt laying in bed upon PT arrival; pt responding to therapist but often tangential and pt's words not related to question/topic therapist attempting to address most of the time.  Currently pt is max assist with logrolling in bed; unable to attempt sitting on EOB d/t pt declining to participate any further (pt stating "I just want to go back to sleep").  Pt would currently benefit from skilled PT to address noted impairments and functional limitations (see below for any additional details).  Upon hospital discharge, pt would benefit from ongoing therapy.     If plan is discharge home, recommend the following: Two people to help with walking and/or transfers;Two people to help with bathing/dressing/bathroom;Assistance with cooking/housework;Direct supervision/assist for medications management;Direct supervision/assist for financial management;Assist for transportation;Help with stairs or ramp for entrance   Can travel by private vehicle   No    Equipment Recommendations  (TBD at next facility)  Recommendations for Other Services       Functional Status Assessment Patient has had a recent decline in their functional status and demonstrates the ability to make significant improvements in function in a reasonable and predictable amount of time.     Precautions / Restrictions Precautions Precautions: Fall Restrictions Weight Bearing Restrictions: No      Mobility  Bed Mobility Overal bed  mobility: Needs Assistance Bed Mobility: Rolling Rolling: Max assist, Used rails         General bed mobility comments: max assist logrolling L/R in bed; pt declined sitting on EOB and stating "I just want to go back to sleep"    Transfers                        Ambulation/Gait                  Stairs            Wheelchair Mobility     Tilt Bed    Modified Rankin (Stroke Patients Only)       Balance                                             Pertinent Vitals/Pain Pain Assessment Pain Assessment: Faces Faces Pain Scale: No hurt Pain Intervention(s): Limited activity within patient's tolerance, Monitored during session Vitals (HR and SpO2 on room air) stable and WFL throughout treatment session.    Home Living Family/patient expects to be discharged to:: Assisted living                   Additional Comments: Per CSW note today 01/01/23 "CSW spoke with nurse at [facility] Nurse and she reported she was walking when she came to the facility without assistance. Nurse reports she kept sitting on the floor.She stopped responding to them and that was why they brought. Very incoherent and babbling."    Prior Function Prior Level of Function : Patient poor historian/Family not  available             Mobility Comments: Per OT eval "TOC placed call to nurse at [facility] d/t phones broken-reports IND mobility with no AD upon admission, however pt began sitting on the floor and stopped talking-incoherent and babbling so staff sent her in" ADLs Comments: Per OT eval "had assist for showering per nurse at [facility] and redirection for other ADLs"     Extremity/Trunk Assessment   Upper Extremity Assessment Upper Extremity Assessment: Generalized weakness    Lower Extremity Assessment Lower Extremity Assessment: Generalized weakness;Difficult to assess due to impaired cognition (appearing at least 3/5 B hip flexion  and ankle DF/PF (laying in ED stretcher bed))       Communication   Communication Communication: Difficulty communicating thoughts/reduced clarity of speech;Difficulty following commands/understanding Following commands: Follows multi-step commands inconsistently Cueing Techniques: Verbal cues;Tactile cues;Gestural cues  Cognition Arousal: Alert   Overall Cognitive Status: No family/caregiver present to determine baseline cognitive functioning                                 General Comments: Oriented to self only; unable to answer other questions clearly (pt's answers not related to question and also pt tangential and speech not clear)        General Comments  Pt agreeable to limited PT session.    Exercises     Assessment/Plan    PT Assessment Patient needs continued PT services  PT Problem List Decreased strength;Decreased activity tolerance;Decreased balance;Decreased mobility;Decreased cognition;Decreased knowledge of use of DME;Decreased safety awareness;Decreased knowledge of precautions       PT Treatment Interventions DME instruction;Gait training;Functional mobility training;Therapeutic activities;Therapeutic exercise;Balance training;Patient/family education;Cognitive remediation    PT Goals (Current goals can be found in the Care Plan section)  Acute Rehab PT Goals Patient Stated Goal: to improve mobility PT Goal Formulation: With patient Time For Goal Achievement: 01/15/23 Potential to Achieve Goals: Fair    Frequency Min 1X/week     Co-evaluation               AM-PAC PT "6 Clicks" Mobility  Outcome Measure Help needed turning from your back to your side while in a flat bed without using bedrails?: A Lot Help needed moving from lying on your back to sitting on the side of a flat bed without using bedrails?: A Lot Help needed moving to and from a bed to a chair (including a wheelchair)?: Total Help needed standing up from a chair  using your arms (e.g., wheelchair or bedside chair)?: Total Help needed to walk in hospital room?: Total Help needed climbing 3-5 steps with a railing? : Total 6 Click Score: 8    End of Session   Activity Tolerance: Patient tolerated treatment well Patient left: in bed;with call bell/phone within reach;with bed alarm set   PT Visit Diagnosis: Other abnormalities of gait and mobility (R26.89);Muscle weakness (generalized) (M62.81);History of falling (Z91.81)    Time: 1478-2956 PT Time Calculation (min) (ACUTE ONLY): 11 min   Charges:   PT Evaluation $PT Eval Low Complexity: 1 Low   PT General Charges $$ ACUTE PT VISIT: 1 Visit        Hendricks Limes, PT 01/01/23, 5:11 PM

## 2023-01-01 NOTE — TOC Initial Note (Signed)
Transition of Care [facility]) - Initial/Assessment Note    Patient Details  Name: Lindsay Snyder MRN: [id number] Date of Birth: 01-Feb-1945  Transition of Care [facility]) CM/SW Contact:    Marquita Palms, LCSW Phone Number: 01/01/2023, 3:10 PM  Clinical Narrative:                  CSW received a secure message to contact the facility. Patient was unable to give history for nurses on her care. CSW spoke with nurse at [facility] and she reported she was walking when she came to the facility without assistance. Nurse reports she kept sitting on the floor.She stopped responding to them and that was why they brought. Very incoherent and babbling. CSW reported this information to nurse [place] and secure for OT nurse. CSW was told to call [phone] call nurse directly due to there phone not working properly. No other need at this time.       Patient Goals and CMS Choice            Expected Discharge Plan and Services                                              Prior Living Arrangements/Services                       Activities of Daily Living      Permission Sought/Granted                  Emotional Assessment              Admission diagnosis:  Acute encephalopathy [G93.40] Encephalopathy [G93.40] Patient Active Problem List   Diagnosis Date Noted   Acute encephalopathy 01/01/2023   UTI (urinary tract infection) 01/01/2023   Encephalopathy 01/01/2023   Lethargy 09/06/2022   Cancer (HCC) 05/02/2022   Vaginal itching 03/01/2022   AKI (acute kidney injury) (HCC) 02/05/2022   Dementia (HCC) 02/05/2022   Mood disorder (HCC) 02/05/2022   Overactive bladder 02/05/2022   Persistent cough for 3 weeks or longer 06/29/2021   Recurrent falls 06/29/2021   Osteoarthritis of hands, 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   Lower abdominal pain 02/03/2021   Urinary incontinence 02/03/2021   Memory changes 07/05/2020   Gastroesophageal reflux disease 07/05/2020    Generalized abdominal pain 03/01/2020   Environmental and seasonal allergies 01/14/2020   GAD (generalized anxiety disorder) 01/14/2020   Pain of left upper extremity 05/26/2019   Oral lesion 02/26/2019   Hemorrhoids 07/11/2017   Preventative health care 07/11/2017   Hyperlipidemia 07/10/2016   Insomnia due to stress 09/30/2014   Medicare annual wellness visit, subsequent 05/17/2014   Murmur, cardiac 05/17/2014   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 [place] [address] Phone: [phone] Fax: [phone]  [id number] [place], [place] - [address] AT [facility] OF [address] [place] [address] Phone: [phone] Fax: [phone]  [id number] - [place], [place] - [address] AT [facility] OF [address] [facility] [address] [place] [address] Phone: [phone] Fax: [phone]  [facility] - [place], [place] - [address] [place] [address] Phone: [phone] Fax: [phone]     Social Determinants of Health (SDOH) Social History: SDOH Screenings   Food Insecurity: No Food Insecurity (09/04/2022)  Housing: Low Risk  (09/04/2022)  Transportation Needs: No Transportation Needs (09/04/2022)  Utilities: Not At Risk (05/17/2022)  Alcohol Screen: Low Risk  (05/17/2022)  Depression (PHQ2-9): Low Risk  (05/17/2022)  Recent Concern: Depression (PHQ2-9) - High Risk (03/01/2022)  Financial Resource Strain: Low Risk  (09/04/2022)  Physical Activity: Inactive (09/04/2022)  Social  Connections: Moderately Integrated (09/04/2022)  Stress: No Stress Concern Present (09/04/2022)  Tobacco Use: Low Risk  (01/01/2023)   SDOH Interventions:     Readmission Risk Interventions     No data to display

## 2023-01-01 NOTE — ED Notes (Addendum)
Pt cleaned up. X1 bowel movement, pt has lot of skin breakdown (level 3, slight spot on R inner thigh that has broken epidermis. Redness and tender) around area of brief. Pt brief placed under her not strapped up to give skin a break. Pt covered back up and repositioned in bed.

## 2023-01-01 NOTE — ED Notes (Signed)
Pt's 2 sons are at bedside. Dr. Chipper Herb made aware so that he can speak with family as his morning rounds allow.

## 2023-01-01 NOTE — Progress Notes (Signed)
Pharmacy Antibiotic Note  Lindsay Snyder is a 77 y.o. female admitted on 01/01/2023 with UTI.  Patient has a past medical history of Alzheimer's dementia, frequent UTIs, C. difficile colitis, and anxiety/depression. She was sent from her facility with fall and altered mental status. Patient was started on PO antibiotics 2 days ago but she has continued to before more confused with slurred speech. Unsure of what antibiotic she was prescribed. In the ED patient was found afebrile, nontachycardic, lactic acid 1.3 and WBC 12.6. Pharmacy has been consulted for cefepime dosing.  Patient given ceftriaxone 2 g IV x 1 in ED  Plan: - Start patient on cefepime 2 g IV q12h - Continue to monitor patient's renal function and cultures  Height: 5\' 2"  (157.5 cm) Weight: 106.7 kg (235 lb 4.8 oz) IBW/kg (Calculated) : 50.1  Temp (24hrs), Avg:98.4 F (36.9 C), Min:98 F (36.7 C), Max:98.8 F (37.1 C)  Recent Labs  Lab 01/01/23 0110 01/01/23 0112  WBC  --  12.6*  CREATININE  --  1.02*  LATICACIDVEN 1.3  --     Estimated Creatinine Clearance: 52.2 mL/min (A) (by C-G formula based on SCr of 1.02 mg/dL (H)).    Allergies  Allergen Reactions   Crestor [Rosuvastatin Calcium] Rash and Other (See Comments)    Myalgia   Tylenol [Acetaminophen] Rash    Antimicrobials this admission: 12/3 Ceftriaxone x 1 12/3 Cefepime >>   Dose adjustments this admission: None  Microbiology results: 12/3 BCx: IP   Thank you for allowing pharmacy to be a part of this patient's care.  Merryl Hacker, PharmD Clinical Pharmacist 01/01/2023 11:39 AM

## 2023-01-01 NOTE — Evaluation (Signed)
Occupational Therapy Evaluation Patient Details Name: Lindsay Snyder MRN: [id number] DOB: 06/29/1945 Today's Date: 01/01/2023   History of Present Illness 77 y.o. female sent from the facility for evaluation of fall and altered mentations. CT head and C-spine are clear.  with PMHx: Alzheimer's dementia, frequent UTIs, C. difficile colitis, anxiety/depression.   Clinical Impression   Pt was seen for OT evaluation this date. Prior to hospital admission, pt was residing at [facility]. Per nurse, daughter states she had only been there a few weeks. Pt unable to provide history and no family present. Attempted to call ALF with no answer or ability to leave VM, called daughter and left VM with no response yet.   Pt presents to acute OT demonstrating impaired ADL performance and functional mobility 2/2 weakness, impaired cognition, balance deficits and limited safety awareness (See OT problem list for additional functional deficits). Pt oriented to person only. Slurred/garbled speech-at times can understand a few words. Required increased cueing to arouse and increased time to follow commands. Max A needed to perform supine<>sit. BP noted to drop from 102/59 in supine to 85/57 in sitting and asking to return to supine. Pt noted to be soiled of urine and BM requiring full linen change. Nurse called to assist. Pt currently requires Max A for all rolling in bed with significant fear of falling. Nurse providing max/total assist to drink from cup. Max A x2 for repositioning to [facility]. TOC contacted via secure chat to see if they can gather more history/PLOF info. Pt would benefit from skilled OT services to address noted impairments and functional limitations (see below for any additional details) in order to maximize safety and independence while minimizing falls risk and caregiver burden. Do anticipate the need for follow up OT services upon acute hospital DC.        If plan is discharge home, recommend the  following: A lot of help with bathing/dressing/bathroom;Two people to help with walking and/or transfers;Assistance with cooking/housework;Help with stairs or ramp for entrance;Direct supervision/assist for financial management;Supervision due to cognitive status;Direct supervision/assist for medications management;Assist for transportation    Functional Status Assessment  Patient has had a recent decline in their functional status and demonstrates the ability to make significant improvements in function in a reasonable and predictable amount of time.  Equipment Recommendations  Other (comment) (defer)    Recommendations for Other Services       Precautions / Restrictions Precautions Precautions: Fall Restrictions Weight Bearing Restrictions: No      Mobility Bed Mobility Overal bed mobility: Needs Assistance Bed Mobility: Rolling, Supine to Sit, Sit to Supine Rolling: Max assist, Used rails   Supine to sit: Max assist, Used rails, HOB elevated Sit to supine: Max assist, Used rails   General bed mobility comments: pt with high fear of falling noted during rolling in bed grabbing rails and stating "please don't let me fall, I have a lot of falls."    Transfers                   General transfer comment: deferred d/t BP drop from supine to sit      Balance Overall balance assessment: Needs assistance Sitting-balance support: Feet supported, Bilateral upper extremity supported Sitting balance-Leahy Scale: Poor Sitting balance - Comments: constant CGA/Min A to maintain balance d/t cognition/safety concerns  ADL either performed or assessed with clinical judgement   ADL Overall ADL's : Needs assistance/impaired Eating/Feeding: Maximal assistance;Bed level Eating/Feeding Details (indicate cue type and reason): nurse provied cup of water for pt to drink and put it to her mouth, as pt is confused                                    General ADL Comments: unable to assses ADLs safely on high hospital gurney, likely to need Mod/Max A for all based on cognition and weakness, BP issues     Vision         Perception         Praxis         Pertinent Vitals/Pain Pain Assessment Pain Assessment: Faces Faces Pain Scale: Hurts little more Pain Location: R hip and vaginal/buttocks wound areas Pain Descriptors / Indicators: Sore Pain Intervention(s): Monitored during session, Limited activity within patient's tolerance     Extremity/Trunk Assessment Upper Extremity Assessment Upper Extremity Assessment: Generalized weakness   Lower Extremity Assessment Lower Extremity Assessment: Generalized weakness       Communication Communication Communication: Difficulty communicating thoughts/reduced clarity of speech;Difficulty following commands/understanding (slurred/garbled speech) Following commands: Follows multi-step commands inconsistently Cueing Techniques: Verbal cues;Tactile cues;Gestural cues   Cognition Arousal: Lethargic   Overall Cognitive Status: No family/caregiver present to determine baseline cognitive functioning                                 General Comments: oriented to self only, unable to recall location or situation     General Comments       Exercises Other Exercises Other Exercises: Attempted to call [facility] and daughter to gather PLOF/history, with no response.   Shoulder Instructions      Home Living Family/patient expects to be discharged to:: Assisted living                                 Additional Comments: unsure of PLOF-called [facility] and unable to reach or leave VM, also called daughter and left VM to gather PLOF-per nurse sounds as if she was recently moved to [facility] but unsure of assist levels      Prior Functioning/Environment Prior Level of Function : Patient poor historian/Family not  available                        OT Problem List: Decreased strength;Pain;Decreased cognition;Decreased safety awareness;Decreased activity tolerance;Impaired balance (sitting and/or standing)      OT Treatment/Interventions: Self-care/ADL training;Therapeutic exercise;Therapeutic activities;Patient/family education;Balance training    OT Goals(Current goals can be found in the care plan section) Acute Rehab OT Goals Patient Stated Goal: improve strength OT Goal Formulation: With patient Time For Goal Achievement: 01/15/23 Potential to Achieve Goals: Good ADL Goals Pt Will Perform Grooming: with supervision;sitting Pt Will Perform Lower Body Bathing: sitting/lateral leans;sit to/from stand;with mod assist;with min assist Pt Will Perform Upper Body Dressing: sitting;with contact guard assist Pt Will Perform Lower Body Dressing: with min assist;with mod assist;sit to/from stand;sitting/lateral leans Pt Will Transfer to Toilet: with mod assist;with min assist;bedside commode Additional ADL Goal #1: Pt will demo bed mobility tasks with SBA and good safety to promote IND/return to PLOF.  OT Frequency: Min 1X/week  Co-evaluation              AM-PAC OT "6 Clicks" Daily Activity     Outcome Measure Help from another person eating meals?: A Little Help from another person taking care of personal grooming?: A Little Help from another person toileting, which includes using toliet, bedpan, or urinal?: Total Help from another person bathing (including washing, rinsing, drying)?: Total Help from another person to put on and taking off regular upper body clothing?: A Lot Help from another person to put on and taking off regular lower body clothing?: Total 6 Click Score: 11   End of Session Nurse Communication: Mobility status  Activity Tolerance: Treatment limited secondary to medical complications (Comment) (BP drop) Patient left: in bed;with call bell/phone within reach;with  nursing/sitter in room  OT Visit Diagnosis: Other abnormalities of gait and mobility (R26.89);Unsteadiness on feet (R26.81);Muscle weakness (generalized) (M62.81);History of falling (Z91.81)                Time: 1331-1405 OT Time Calculation (min): 34 min Charges:  OT General Charges $OT Visit: 1 Visit OT Evaluation $OT Eval Moderate Complexity: 1 Mod OT Treatments $Self Care/Home Management : 8-22 mins Trellis Guirguis, OTR/L 01/01/23, 2:44 PM  Kathrynn Backstrom E Kyjuan Gause 01/01/2023, 2:36 PM

## 2023-01-01 NOTE — ED Triage Notes (Signed)
Pt arrives via [facility] from [facility] - EMS reports staff was unsure if pt is being treated for UTI but called due to pts AMS for the last two days. Pt is disoriented to person, place, time, and situation at this time - speech is incomprehensible but clear at this time.

## 2023-01-01 NOTE — ED Notes (Signed)
Mild bruising from co band on R arm above elbow crease noted. No problems with IV at this time.

## 2023-01-01 NOTE — ED Provider Notes (Signed)
[facility] Provider Note    Event Date/Time   First MD Initiated Contact with Patient 01/01/23 [phone]     (approximate)   History   Altered Mental Status   HPI  Lindsay Snyder is a 77 y.o. female with history of dementia who presents to the emergency department from [place] house with altered mental status x 2 days.  When EMS arrived they found patient on the ground.  It is unclear how she fell.  Patient is unable to answer any questions.  Staff told EMS they thought she may be getting treatment for UTI but were unsure.   History provided by EMS.    Past Medical History:  Diagnosis Date   Allergy 010150   Anxiety 08/30/2019   Appendicitis 8    C. difficile diarrhea 02/04/2022   Cancer [facility])    daughter states patient never had cancer   Dementia (HCC)    GERD (gastroesophageal reflux disease) 01/30/2019   Otitis externa 10/14/2020   Polyp of colon    Benign    Past Surgical History:  Procedure Laterality Date   ABDOMINAL HYSTERECTOMY     APPENDECTOMY     BREAST EXCISIONAL BIOPSY Right    + 20 years neg   COLONOSCOPY WITH PROPOFOL N/A 12/16/2017   Procedure: COLONOSCOPY WITH PROPOFOL;  Surgeon: Wyline Mood, MD;  Location: [facility] ENDOSCOPY;  Service: Gastroenterology;  Laterality: N/A;   COLONOSCOPY WITH PROPOFOL N/A 01/02/2021   Procedure: COLONOSCOPY WITH PROPOFOL;  Surgeon: Wyline Mood, MD;  Location: [facility] ENDOSCOPY;  Service: Gastroenterology;  Laterality: N/A;    MEDICATIONS:  Prior to Admission medications   Medication Sig Start Date End Date Taking? Authorizing Provider  cetirizine (ZYRTEC) 10 MG tablet Take 10 mg by mouth daily.    [provider]  donepezil (ARICEPT) 5 MG tablet Take 5 mg by mouth at bedtime. 09/29/20   [provider]  DULoxetine (CYMBALTA) 20 MG capsule TAKE 2 CAPSULES(40 MG) BY MOUTH DAILY FOR ANXIETY 08/26/22   Doreene Nest, NP  lithium carbonate 150 MG capsule Take 150 mg by mouth daily.  12/01/21   [provider]  memantine (NAMENDA) 5 MG tablet Take 2 tablets (10 mg total) by mouth daily. For memory 11/29/22   Doreene Nest, NP  mirtazapine (REMERON) 15 MG tablet Take 15 mg by mouth at bedtime.    [provider]  ondansetron (ZOFRAN-ODT) 4 MG disintegrating tablet Take 1 tablet (4 mg total) by mouth every 8 (eight) hours as needed for nausea or vomiting. 02/09/22   Doreene Nest, NP  oxybutynin (DITROPAN XL) 15 MG 24 hr tablet TAKE 1 TABLET(15 MG) BY MOUTH AT BEDTIME FOR OVERACTIVE BLADDER 07/10/22   Doreene Nest, NP  traZODone (DESYREL) 100 MG tablet Take 100 mg by mouth at bedtime. 12/01/21   [provider]    Physical Exam   Triage Vital Signs: ED Triage Vitals [01/01/23 0058]  Encounter Vitals Group     BP 127/62     Systolic BP Percentile      Diastolic BP Percentile      Pulse Rate 72     Resp (!) 21     Temp 98.8 F (37.1 C)     Temp Source Axillary     SpO2 100 %     Weight 235 lb 4.8 oz (106.7 kg)     Height 5\' 2"  (1.575 m)     Head Circumference      Peak  Flow      Pain Score 0     Pain Loc      Pain Education      Exclude from Growth Chart     Most recent vital signs: Vitals:   01/01/23 0058  BP: 127/62  Pulse: 72  Resp: (!) 21  Temp: 98.8 F (37.1 C)  SpO2: 100%    CONSTITUTIONAL: Alert, elderly, intermittently coughing, does not appear uncomfortable, unable to answer questions appropriately HEAD: Normocephalic, atraumatic EYES: Conjunctivae clear, pupils appear equal, sclera nonicteric ENT: normal nose; moist mucous membranes NECK: Supple, normal ROM CARD: RRR; S1 and S2 appreciated RESP: Normal chest excursion without splinting or tachypnea; breath sounds clear and equal bilaterally; no wheezes, no rhonchi, no rales, no hypoxia or respiratory distress, speaking full sentences ABD/GI: Non-distended; soft, non-tender, no rebound, no guarding, no peritoneal signs BACK: The back appears  normal EXT: Normal ROM in all joints; no deformity noted, no edema SKIN: Normal color for age and race; warm; no rash on exposed skin NEURO: Moves all extremities equally, nonsensical speech but no dysarthria, no facial asymmetry, unable to reliably test sensation    ED Results / Procedures / Treatments   LABS: (all labs ordered are listed, but only abnormal results are displayed) Labs Reviewed  CBC WITH DIFFERENTIAL/PLATELET - Abnormal; Notable for the following components:      Result Value   WBC 12.6 (*)    Neutro Abs 10.1 (*)    Monocytes Absolute 1.1 (*)    Abs Immature Granulocytes 0.08 (*)    All other components within normal limits  COMPREHENSIVE METABOLIC PANEL - Abnormal; Notable for the following components:   Potassium 3.0 (*)    Glucose, Bld 127 (*)    Creatinine, Ser 1.02 (*)    Calcium 8.5 (*)    AST 74 (*)    Total Bilirubin 1.3 (*)    GFR, Estimated 56 (*)    All other components within normal limits  LITHIUM LEVEL - Abnormal; Notable for the following components:   Lithium Lvl <0.06 (*)    All other components within normal limits  URINALYSIS, ROUTINE W REFLEX MICROSCOPIC - Abnormal; Notable for the following components:   Color, Urine YELLOW (*)    APPearance HAZY (*)    Hgb urine dipstick MODERATE (*)    Ketones, ur 5 (*)    Nitrite POSITIVE (*)    Leukocytes,Ua LARGE (*)    Bacteria, UA MANY (*)    All other components within normal limits  CK - Abnormal; Notable for the following components:   Total CK 1,988 (*)    All other components within normal limits  TROPONIN I (HIGH SENSITIVITY) - Abnormal; Notable for the following components:   Troponin I (High Sensitivity) 43 (*)    All other components within normal limits  RESP PANEL BY RT-PCR (RSV, FLU A&B, COVID)  RVPGX2  CULTURE, BLOOD (SINGLE)  URINE DRUG SCREEN, QUALITATIVE ([facility] ONLY)  TSH  AMMONIA  PROCALCITONIN  LACTIC ACID, PLASMA  MAGNESIUM     EKG:  EKG  Interpretation Date/Time:  Tuesday January 01 2023 03:16:41 EST Ventricular Rate:  78 PR Interval:    QRS Duration:  157 QT Interval:  453 QTC Calculation: 516 R Axis:   -55  Text Interpretation: Normal sinus rhythm RBBB and LAFB Left ventricular hypertrophy Confirmed by Rochele Raring [phone]) on 01/01/2023 3:25:13 AM         RADIOLOGY: My personal review and interpretation of imaging: Chest x-ray clear.  CT head and cervical spine showed no acute abnormality.  I have personally reviewed all radiology reports.   DG Chest Portable 1 View  Result Date: 01/01/2023 CLINICAL DATA:  Altered mental status and cough. EXAM: PORTABLE CHEST 1 VIEW COMPARISON:  March 01, 2022 FINDINGS: The cardiac silhouette is borderline in size which may be technical in origin. There is moderate severity calcification of the aortic arch. Low lung volumes are noted. Mild, diffuse, stable chronic appearing increased interstitial lung markings are seen. A chronic deformity is seen involving the head and neck of the proximal left humerus. Multilevel degenerative changes are seen throughout the thoracic spine. IMPRESSION: Low lung volumes with mild, diffuse, stable chronic appearing increased interstitial lung markings. Electronically Signed   By: Aram Candela M.D.   On: 01/01/2023 04:02   CT HEAD WO CONTRAST ( )  Result Date: 01/01/2023 CLINICAL DATA:  Altered mental status.  Head trauma. EXAM: CT HEAD WITHOUT CONTRAST CT CERVICAL SPINE WITHOUT CONTRAST TECHNIQUE: Multidetector CT imaging of the head and cervical spine was performed following the standard protocol without intravenous contrast. Multiplanar CT image reconstructions of the cervical spine were also generated. RADIATION DOSE REDUCTION: This exam was performed according to the departmental dose-optimization program which includes automated exposure control, adjustment of the mA and/or kV according to patient size and/or use of iterative reconstruction  technique. COMPARISON:  01/14/2022 FINDINGS: CT HEAD FINDINGS Brain: There is no mass, hemorrhage or extra-axial collection. The size and configuration of the ventricles and extra-axial CSF spaces are normal. There is hypoattenuation of the periventricular white matter, most commonly indicating chronic ischemic microangiopathy. Vascular: No abnormal hyperdensity of the major intracranial arteries or dural venous sinuses. No intracranial atherosclerosis. Skull: The visualized skull base, calvarium and extracranial soft tissues are normal. Sinuses/Orbits: Right mastoid effusion.  The orbits are normal. CT CERVICAL SPINE FINDINGS Alignment: No static subluxation. Facets are aligned. Occipital condyles are normally positioned. Skull base and vertebrae: No acute fracture. Soft tissues and spinal canal: No prevertebral fluid or swelling. No visible canal hematoma. Disc levels: No advanced spinal canal or neural foraminal stenosis. Upper chest: No pneumothorax, pulmonary nodule or pleural effusion. Other: The cervical spine portion of the examination is markedly degraded by motion. IMPRESSION: 1. No acute intracranial abnormality. 2. Chronic ischemic microangiopathy. 3. No acute fracture or static subluxation of the cervical spine, allowing for marked motion degradation. Electronically Signed   By: Deatra Robinson M.D.   On: 01/01/2023 03:50   CT Cervical Spine Wo Contrast  Result Date: 01/01/2023 CLINICAL DATA:  Altered mental status.  Head trauma. EXAM: CT HEAD WITHOUT CONTRAST CT CERVICAL SPINE WITHOUT CONTRAST TECHNIQUE: Multidetector CT imaging of the head and cervical spine was performed following the standard protocol without intravenous contrast. Multiplanar CT image reconstructions of the cervical spine were also generated. RADIATION DOSE REDUCTION: This exam was performed according to the departmental dose-optimization program which includes automated exposure control, adjustment of the mA and/or kV according  to patient size and/or use of iterative reconstruction technique. COMPARISON:  01/14/2022 FINDINGS: CT HEAD FINDINGS Brain: There is no mass, hemorrhage or extra-axial collection. The size and configuration of the ventricles and extra-axial CSF spaces are normal. There is hypoattenuation of the periventricular white matter, most commonly indicating chronic ischemic microangiopathy. Vascular: No abnormal hyperdensity of the major intracranial arteries or dural venous sinuses. No intracranial atherosclerosis. Skull: The visualized skull base, calvarium and extracranial soft tissues are normal. Sinuses/Orbits: Right mastoid effusion.  The orbits are normal. CT CERVICAL SPINE FINDINGS  Alignment: No static subluxation. Facets are aligned. Occipital condyles are normally positioned. Skull base and vertebrae: No acute fracture. Soft tissues and spinal canal: No prevertebral fluid or swelling. No visible canal hematoma. Disc levels: No advanced spinal canal or neural foraminal stenosis. Upper chest: No pneumothorax, pulmonary nodule or pleural effusion. Other: The cervical spine portion of the examination is markedly degraded by motion. IMPRESSION: 1. No acute intracranial abnormality. 2. Chronic ischemic microangiopathy. 3. No acute fracture or static subluxation of the cervical spine, allowing for marked motion degradation. Electronically Signed   By: Deatra Robinson M.D.   On: 01/01/2023 03:50     PROCEDURES:  Critical Care performed: No     .1-3 Lead EKG Interpretation  Performed by: Raynie Steinhaus, Layla Maw, DO Authorized by: Tanikka Bresnan, Layla Maw, DO     Interpretation: normal     ECG rate:  72   ECG rate assessment: normal     Rhythm: sinus rhythm     Ectopy: none     Conduction: normal       IMPRESSION / MDM / ASSESSMENT AND PLAN / ED COURSE  I reviewed the triage vital signs and the nursing notes.    Patient here with altered mental status ongoing for 2 days.  Found on the floor at her nursing  facility.  The patient is on the cardiac monitor to evaluate for evidence of arrhythmia and/or significant heart rate changes.   DIFFERENTIAL DIAGNOSIS (includes but not limited to):   Intracranial hemorrhage, stroke, UTI, hepatic encephalopathy, metabolic encephalopathy, dehydration, electrolyte derangement, thyroid dysfunction   Patient's presentation is most consistent with acute presentation with potential threat to life or bodily function.   PLAN: Will obtain labs, urine, CT head and cervical spine, EKG.  Anticipate admission.  Patient outside of tPA window given symptoms ongoing for 2 days.   MEDICATIONS GIVEN IN ED: Medications  potassium chloride 10 mEq in 100 mL IVPB (10 mEq Intravenous New Bag/Given 01/01/23 0329)  0.9 %  sodium chloride infusion (has no administration in time range)  sodium chloride 0.9 % bolus 1,000 mL (1,000 mLs Intravenous New Bag/Given 01/01/23 0326)     ED COURSE: Patient's labs show a leukocytosis of 12,000.  Potassium of 3.0.  Will give replacement.  Normal TSH.  COVID and flu negative.  CK elevated at almost 2000.  Patient getting IV fluids.  Troponin minimally elevated.  Second pending.  Normal lactic.  Patient does have a UTI.  Will send culture and give Rocephin.  Ammonia normal.  Chest x-ray, CT head and cervical spine reviewed and interpreted by myself and the radiologist and shows no acute abnormality.  Will discuss with hospitalist for admission for rhabdomyolysis, UTI, altered mental status.   CONSULTS:  Consulted and discussed patient's case with hospitalist, Dr. Arville Care.  I have recommended admission and consulting physician agrees and will place admission orders.  Patient (and family if present) agree with this plan.   I reviewed all nursing notes, vitals, pertinent previous records.  All labs, EKGs, imaging ordered have been independently reviewed and interpreted by myself.    OUTSIDE RECORDS REVIEWED: Reviewed last neurology note on  03/26/2022.       FINAL CLINICAL IMPRESSION(S) / ED DIAGNOSES   Final diagnoses:  Altered mental status, unspecified altered mental status type  Acute UTI  Non-traumatic rhabdomyolysis  Hypokalemia     Rx / DC Orders   ED Discharge Orders     None        Note:  This document was prepared using Dragon voice recognition software and may include unintentional dictation errors.   Cayde Held, Layla Maw, DO 01/01/23 [phone]

## 2023-01-01 NOTE — Progress Notes (Signed)
MEWS Progress Note  Patient Details Name: Lindsay Snyder MRN: [id number] DOB: 02-09-1945 Today's Date: 01/01/2023   MEWS Flowsheet Documentation:  Assess: MEWS Score Temp: 98.5 F (36.9 C) BP: (!) 121/58 MAP (mmHg): 76 Pulse Rate: 63 ECG Heart Rate: 82 Resp: 19 Level of Consciousness: Responds to Pain SpO2: 94 % O2 Device: Room Air Assess: MEWS Score MEWS Temp: 0 MEWS Systolic: 0 MEWS Pulse: 0 MEWS RR: 0 MEWS LOC: 2 MEWS Score: 2 MEWS Score Color: Yellow Assess: SIRS CRITERIA SIRS Temperature : 0 SIRS Respirations : 0 SIRS Pulse: 0 SIRS WBC: 0 SIRS Score Sum : 0 SIRS Temperature : 0 SIRS Pulse: 0 SIRS Respirations : 0 SIRS WBC: 0 SIRS Score Sum : 0 Notify: Charge Nurse/RN Name of Charge Nurse/RN Notified: Corrie Dandy, RN Provider Notification Provider Name/Title: Larkin Ina, NP Date Provider Notified: 01/01/23 Time Provider Notified: 2215 Method of Notification: Page (secure chat) Notification Reason: Change in status (Mentation change - Responds to pain and wakes only for a second then falls back to sleep.) Provider response: No new orders Date of Provider Response: 01/01/23 Time of Provider Response: 2228   Patient with decreased alertness. Patient yellow MEWS due to responding only to pain and movement. Patient unable to stay awake long enough for RN to properly assess her. Patient will wake up then fall right back to sleep. RN unable to administer PO medications d/t patient's mentation   Ernest Mallick 01/01/2023, 10:29 PM

## 2023-01-02 ENCOUNTER — Inpatient Hospital Stay: Payer: Medicare HMO

## 2023-01-02 ENCOUNTER — Inpatient Hospital Stay (HOSPITAL_COMMUNITY)
Admit: 2023-01-02 | Discharge: 2023-01-02 | Disposition: A | Discharge status: Home / Self Care | Payer: Medicare HMO | Attending: Internal Medicine

## 2023-01-02 DIAGNOSIS — R7989 Other specified abnormal findings of blood chemistry: Secondary | ICD-10-CM | POA: Diagnosis not present

## 2023-01-02 DIAGNOSIS — I340 Nonrheumatic mitral (valve) insufficiency: Secondary | ICD-10-CM

## 2023-01-02 DIAGNOSIS — G9340 Encephalopathy, unspecified: Secondary | ICD-10-CM | POA: Diagnosis not present

## 2023-01-02 LAB — CK: Total CK: 582 U/L — ABNORMAL HIGH (ref 38–234)

## 2023-01-02 LAB — BASIC METABOLIC PANEL
Anion gap: 6 (ref 5–15)
BUN: 9 mg/dL (ref 8–23)
CO2: 22 mmol/L (ref 22–32)
Calcium: 8.1 mg/dL — ABNORMAL LOW (ref 8.9–10.3)
Chloride: 114 mmol/L — ABNORMAL HIGH (ref 98–111)
Creatinine, Ser: 0.89 mg/dL (ref 0.44–1.00)
GFR, Estimated: >60 mL/min (ref >60)
Glucose, Bld: 89 mg/dL (ref 70–99)
Potassium: 3.4 mmol/L — ABNORMAL LOW (ref 3.5–5.1)
Sodium: 142 mmol/L (ref 135–145)

## 2023-01-02 LAB — ECHOCARDIOGRAM COMPLETE
AR max vel: 1.61 cm2
AV Area VTI: 1.59 cm2
AV Area mean vel: 1.63 cm2
AV Mean grad: 17 mm[Hg]
AV Peak grad: 30.3 mm[Hg]
Ao pk vel: 2.75 m/s
Area-P 1/2: 3.03 cm2
Height: 62 in
MV VTI: 2.15 cm2
S' Lateral: 2.9 cm
Weight: 3764.8 [oz_av]

## 2023-01-02 MED ORDER — POTASSIUM CHLORIDE CRYS ER 20 MEQ PO TBCR
40.0000 meq | EXTENDED_RELEASE_TABLET | Freq: Once | ORAL | Status: AC
  Administered 2023-01-02: 40 meq via ORAL

## 2023-01-02 MED ORDER — LEVOFLOXACIN 750 MG PO TABS
750.0000 mg | ORAL_TABLET | Freq: Every day | ORAL | Status: DC
  Administered 2023-01-02 – 2023-01-04 (×3): 750 mg via ORAL
  Filled 2023-01-02 (×3): qty 1

## 2023-01-02 NOTE — Progress Notes (Signed)
Progress Note   Patient: Lindsay Snyder YQM:[id number] DOB: [birth year] DOA: 01/01/2023     1 DOS: the patient was seen and examined on 01/02/2023   Brief hospital course: From HPI "Lindsay Snyder is a 77 y.o. female with medical history significant of Alzheimer's dementia, frequent UTIs, C. difficile colitis, anxiety/depression, sent from the facility for evaluation of fall and altered mentations.  Assessment and Plan:  Acute metabolic encephalopathy Acute aphasia-improved -Secondary UTI -According to facility staff and patient's daughter over the phone, patient tend to have mentation changes and slurred speech in her past episode of recurrent UTI which she has had 3 episodes this summer along.  However patient did not tolerate suppressive antibiotic treatment due to recurrent C. difficile colitis. Patient still confused and have taken out IV lines about 3 times today Antibiotics have been switched to Levaquin given previous history of Pseudomonas We will obtain MRI of the brain   UTI, recurrent -Reviewed the past 3 urine culture result in summer 2024 showed different organisms on each occasions, which includes Porteous, Enterobacter and Pseudomonas, all of them are pansensitive. Continue current antibiotics   Rhabdomyolysis -Patient received IV fluid CPK improving   Elevated troponins -Denies any chest pain, repeat EKG showed no significant ST changes Follow-up on echo results Monitor telemetry   Advanced dementia -Hold off memantine and Aricept today -Continue Remeron and lithium   DVT prophylaxis: Lovenox  Code Status: Full code  Family Communication: Son and daughter over the phone  Disposition Plan: Pending clinical course  Consults called: None Admission status: Telemetry admission    Subjective:  Patient seen and examined at bedside this morning Appears confused Having word salad Unable to tell me why she is Will obtain MRI of the brain Patient has taking  out about 3 IV lines out today Antibiotics switched to p.o.  Physical Exam: ENMT: Mucous membranes are moist. Posterior pharynx clear of any exudate or lesions.Normal dentition.  Neck: normal, supple, no masses, no thyromegaly Respiratory: clear to auscultation bilaterally, no wheezing, no crackles. Normal respiratory effort. No accessory muscle use.  Cardiovascular: Regular rate and rhythm, no murmurs / rubs / gallops. No extremity edema. 2+ pedal pulses. No carotid bruits.  Abdomen: no tenderness, no masses palpated. No hepatosplenomegaly. Bowel sounds positive.  Musculoskeletal: no clubbing / cyanosis. No joint deformity upper and lower extremities. Good ROM, no contractures. Normal muscle tone.  Skin: no rashes, lesions, ulcers. No induration Neurologic: No facial droops, moving all extremities has word salad and appears confused    Data Reviewed: I have reviewed patient's CT scan of the brain that did not show any acute intracranial pathology  Vitals:   01/01/23 2208 01/02/23 0008 01/02/23 0335 01/02/23 0842  BP: (!) 121/58 138/68 126/69 (!) 142/66  Pulse: 63 71 65 70  Resp:  18 18   Temp: 98.5 F (36.9 C) (!) 97.3 F (36.3 C) (!) 97.5 F (36.4 C) 97.9 F (36.6 C)  TempSrc: Axillary Oral Oral   SpO2: 94% 98% 100% 99%  Weight:      Height:          Latest Ref Rng & Units 01/01/2023    1:12 AM 09/06/2022   12:26 PM 03/01/2022   10:06 AM  CBC  WBC 4.0 - 10.5 K/uL 12.6  8.2  9.7   Hemoglobin 12.0 - 15.0 g/dL 52.8  41.3  24.4   Hematocrit 36.0 - 46.0 % 41.8  45.0  48.4   Platelets 150 - 400 K/uL 248  222.0  245.0        Latest Ref Rng & Units 01/02/2023    7:05 AM 01/01/2023    1:12 AM 09/06/2022   12:26 PM  CMP  Glucose 70 - 99 mg/dL 89  557  93   BUN 8 - 23 mg/dL 9  15  24    Creatinine 0.44 - 1.00 mg/dL 3.22  0.25  4.27   Sodium 135 - 145 mmol/L 142  136  135   Potassium 3.5 - 5.1 mmol/L 3.4  3.0  4.5   Chloride 98 - 111 mmol/L 114  103  101   CO2 22 - 32 mmol/L 22  23   29    Calcium 8.9 - 10.3 mg/dL 8.1  8.5  9.2   Total Protein 6.5 - 8.1 g/dL  6.5    Total Bilirubin <1.2 mg/dL  1.3    Alkaline Phos 38 - 126 U/L  52    AST 15 - 41 U/L  74    ALT 0 - 44 U/L  42      Family Communication: None present at bedside  Disposition: Status is: Inpatient   Time spent: 56 minutes  Author: Loyce Dys, MD 01/02/2023 4:14 PM  For on call review [email or web address].

## 2023-01-02 NOTE — Plan of Care (Signed)
  Problem: Education: Goal: Knowledge of General Education information will improve Description: Including pain rating scale, medication(s)/side effects and non-pharmacologic comfort measures Outcome: Progressing   Problem: Health Behavior/Discharge Planning: Goal: Ability to manage health-related needs will improve Outcome: Progressing   Problem: Clinical Measurements: Goal: Ability to maintain clinical measurements within normal limits will improve Outcome: Progressing Goal: Will remain free from infection Outcome: Progressing Goal: Diagnostic test results will improve Outcome: Progressing Goal: Respiratory complications will improve Outcome: Progressing Goal: Cardiovascular complication will be avoided Outcome: Progressing   Problem: Activity: Goal: Risk for activity intolerance will decrease Outcome: Progressing   Problem: Nutrition: Goal: Adequate nutrition will be maintained Outcome: Progressing   Problem: Coping: Goal: Level of anxiety will decrease Outcome: Progressing   Problem: Elimination: Goal: Will not experience complications related to bowel motility Outcome: Progressing Goal: Will not experience complications related to urinary retention Outcome: Progressing   Problem: Pain Management: Goal: General experience of comfort will improve Outcome: Progressing   Problem: Safety: Goal: Ability to remain free from injury will improve Outcome: Progressing   Problem: Skin Integrity: Goal: Risk for impaired skin integrity will decrease Outcome: Progressing   Problem: Urinary Elimination: Goal: Signs and symptoms of infection will decrease Outcome: Progressing

## 2023-01-02 NOTE — Progress Notes (Signed)
Pt noted to be up standing at edge of bed, bed alarm going off. Changed pt's bed d/t urine, pt able to sit in chair and follows commands to lay back in bed. Found pt's 3rd IV at bedside. Pt is alert to her name, but claims not to have a birthday. Pt does have history of dementia. Notified MD of above and requested IV medication be changed to PO and order to leave IV out placed.

## 2023-01-02 NOTE — Progress Notes (Signed)
Physical Therapy Treatment Patient Details Name: Lindsay Snyder MRN: [id number] DOB: 1945-10-18 Today's Date: 01/02/2023   History of Present Illness Pt is a 77 y.o. female presenting to hospital 01/01/23 with c/o AMS and fall.  Pt admitted with acute metabolic encephalopathy, acute aphasia, recurrent UTI, rhabdomyolysis.  PMH includes anxiety, appendicitis, c-diff, dementia.    PT Comments  Patient is confused but cooperative. Patient required maximal assistance for bed mobility. Sitting balance is fair with no external support required to maintain midline. No dizziness reported with sitting upright and no significant change in vitals. Patient complains of mild generalized pain and fatigue, declined to standing or scoot towards head of bed. Recommend to continue PT to maximize independence and decrease caregiver burden. Anticipate patient will need rehabilitation <3 hours/day after this hospital stay.    If plan is discharge home, recommend the following: Two people to help with walking and/or transfers;Two people to help with bathing/dressing/bathroom;Assistance with cooking/housework;Direct supervision/assist for medications management;Direct supervision/assist for financial management;Assist for transportation;Help with stairs or ramp for entrance   Can travel by private vehicle     No  Equipment Recommendations  None recommended by PT    Recommendations for Other Services       Precautions / Restrictions Precautions Precautions: Fall Restrictions Weight Bearing Restrictions: No     Mobility  Bed Mobility Overal bed mobility: Needs Assistance Bed Mobility: Supine to Sit, Sit to Supine     Supine to sit: Max assist Sit to supine: Max assist   General bed mobility comments: assistance for LE and trunk support. cues for sequencing and task segmentation. increased time and effort required    Transfers                   General transfer comment: patient declined to  stand/scoot, reporting just not feeling well today    Ambulation/Gait                   Stairs             Wheelchair Mobility     Tilt Bed    Modified Rankin (Stroke Patients Only)       Balance Overall balance assessment: Needs assistance Sitting-balance support: Feet supported, Bilateral upper extremity supported Sitting balance-Leahy Scale: Fair Sitting balance - Comments: no external support required from therapist. no significant change in vitals noted with seated level activity.                                    Cognition Arousal: Alert Behavior During Therapy: Flat affect Overall Cognitive Status: No family/caregiver present to determine baseline cognitive functioning                                 General Comments: patient oriented to first name only. disoriented to place, situation, time. she is able to follow single step commands with increased time        Exercises      General Comments        Pertinent Vitals/Pain Pain Assessment Pain Assessment: Faces Faces Pain Scale: Hurts a little bit Pain Location: generalized pain with movement Pain Descriptors / Indicators: Discomfort Pain Intervention(s): Limited activity within patient's tolerance, Monitored during session, Repositioned    Home Living  Prior Function            PT Goals (current goals can now be found in the care plan section) Acute Rehab PT Goals Patient Stated Goal: to feel better PT Goal Formulation: With patient Time For Goal Achievement: 01/15/23 Potential to Achieve Goals: Fair Progress towards PT goals: Progressing toward goals    Frequency    Min 1X/week      PT Plan      Co-evaluation              AM-PAC PT "6 Clicks" Mobility   Outcome Measure  Help needed turning from your back to your side while in a flat bed without using bedrails?: A Lot Help needed moving from lying  on your back to sitting on the side of a flat bed without using bedrails?: A Lot Help needed moving to and from a bed to a chair (including a wheelchair)?: Total Help needed standing up from a chair using your arms (e.g., wheelchair or bedside chair)?: Total Help needed to walk in hospital room?: Total Help needed climbing 3-5 steps with a railing? : Total 6 Click Score: 8    End of Session Equipment Utilized During Treatment: Oxygen Activity Tolerance: Patient limited by fatigue Patient left: in bed;with call bell/phone within reach;with bed alarm set   PT Visit Diagnosis: Other abnormalities of gait and mobility (R26.89);Muscle weakness (generalized) (M62.81);History of falling (Z91.81)     Time: 0912-0923 PT Time Calculation (min) (ACUTE ONLY): 11 min  Charges:    $Therapeutic Activity: 8-22 mins PT General Charges $$ ACUTE PT VISIT: 1 Visit                    Donna Bernard, PT, MPT    Ina Homes 01/02/2023, 9:32 AM

## 2023-01-02 NOTE — Progress Notes (Signed)
Per Dr Meriam Sprague, dc tele monitoring

## 2023-01-02 NOTE — Progress Notes (Signed)
*  PRELIMINARY RESULTS* Echocardiogram 2D Echocardiogram has been performed.  Carolyne Fiscal 01/02/2023, 12:29 PM

## 2023-01-03 ENCOUNTER — Telehealth: Payer: Self-pay | Admitting: *Deleted

## 2023-01-03 DIAGNOSIS — G9340 Encephalopathy, unspecified: Secondary | ICD-10-CM | POA: Diagnosis not present

## 2023-01-03 LAB — BASIC METABOLIC PANEL
Anion gap: 8 (ref 5–15)
BUN: 8 mg/dL (ref 8–23)
CO2: 23 mmol/L (ref 22–32)
Calcium: 8.3 mg/dL — ABNORMAL LOW (ref 8.9–10.3)
Chloride: 108 mmol/L (ref 98–111)
Creatinine, Ser: 0.87 mg/dL (ref 0.44–1.00)
GFR, Estimated: >60 mL/min (ref >60)
Glucose, Bld: 89 mg/dL (ref 70–99)
Potassium: 2.9 mmol/L — ABNORMAL LOW (ref 3.5–5.1)
Sodium: 139 mmol/L (ref 135–145)

## 2023-01-03 LAB — CBC WITH DIFFERENTIAL/PLATELET
Abs Immature Granulocytes: 0.07 10*3/uL (ref 0.00–0.07)
Basophils Absolute: 0 10*3/uL (ref 0.0–0.1)
Basophils Relative: 0 %
Eosinophils Absolute: 0.5 10*3/uL (ref 0.0–0.5)
Eosinophils Relative: 6 %
HCT: 41.6 % (ref 36.0–46.0)
Hemoglobin: 13.6 g/dL (ref 12.0–15.0)
Immature Granulocytes: 1 %
Lymphocytes Relative: 17 %
Lymphs Abs: 1.5 10*3/uL (ref 0.7–4.0)
MCH: 29.6 pg (ref 26.0–34.0)
MCHC: 32.7 g/dL (ref 30.0–36.0)
MCV: 90.4 fL (ref 80.0–100.0)
Monocytes Absolute: 0.8 10*3/uL (ref 0.1–1.0)
Monocytes Relative: 9 %
Neutro Abs: 6 10*3/uL (ref 1.7–7.7)
Neutrophils Relative %: 67 %
Platelets: 276 10*3/uL (ref 150–400)
RBC: 4.6 MIL/uL (ref 3.87–5.11)
RDW: 12.8 % (ref 11.5–15.5)
WBC: 8.9 10*3/uL (ref 4.0–10.5)
nRBC: 0 % (ref 0.0–0.2)

## 2023-01-03 MED ORDER — MIRABEGRON ER 25 MG PO TB24
25.0000 mg | ORAL_TABLET | Freq: Every day | ORAL | Status: DC
  Administered 2023-01-03 – 2023-01-05 (×3): 25 mg via ORAL
  Filled 2023-01-03 (×3): qty 1

## 2023-01-03 MED ORDER — POTASSIUM CHLORIDE CRYS ER 20 MEQ PO TBCR
40.0000 meq | EXTENDED_RELEASE_TABLET | Freq: Once | ORAL | Status: AC
  Administered 2023-01-03: 40 meq via ORAL
  Filled 2023-01-03: qty 2

## 2023-01-03 NOTE — Progress Notes (Signed)
Physical Therapy Treatment Patient Details Name: Lindsay Snyder MRN: [id number] DOB: 06-Nov-1945 Today's Date: 01/03/2023   History of Present Illness Pt is a 77 y.o. female presenting to hospital 01/01/23 with c/o AMS and fall.  Pt admitted with acute metabolic encephalopathy, acute aphasia, recurrent UTI, rhabdomyolysis.  PMH includes anxiety, appendicitis, c-diff, dementia.    PT Comments  Pt was supine in bed, nude, upon arrival. Sh is alert but confused and disoriented. Does present with fear/anxiety but is able to be redirected. Eventually agreeable to attempt OOB due to need to use BR. Unfortunately pt has 2 x incontinence episodes with standing and was unable to get onto [facility] quickly enough to actually use. Pt returned to supine in bed at conclusion of session with RN tech aware of incontinence concerns. Acute PT will continue to follow and progress as able per current POC.    If plan is discharge home, recommend the following: A lot of help with walking and/or transfers;A lot of help with bathing/dressing/bathroom;Assistance with cooking/housework;Direct supervision/assist for medications management;Direct supervision/assist for financial management;Assist for transportation;Help with stairs or ramp for entrance;Supervision due to cognitive status     Equipment Recommendations  None recommended by PT (Defer to next level of care)       Precautions / Restrictions Precautions Precautions: Fall Restrictions Weight Bearing Restrictions: No     Mobility  Bed Mobility Overal bed mobility: Needs Assistance Bed Mobility: Supine to Sit, Sit to Supine  Supine to sit: Min assist, Mod assist Sit to supine: Min assist, Mod assist    Transfers Overall transfer level: Needs assistance Equipment used: Rolling walker (2 wheels) Transfers: Sit to/from Stand, Bed to chair/wheelchair/BSC Sit to Stand: Min assist, From elevated surface Step pivot transfers: Min assist  General transfer comment:  Pt was able to stand 3 x EOB and took steps to [facility] however pt urinated on herself and floor 2 x priro to sitting on BSC. Incontinenece/ cognition limits session progression throughout    Ambulation/Gait Ambulation/Gait assistance: Min assist Gait Distance (Feet): 3 Feet Assistive device: Rolling walker (2 wheels) Gait Pattern/deviations: Step-to pattern Gait velocity: decrease  General Gait Details: pt was able to take steps along EOB    Balance Overall balance assessment: Needs assistance Sitting-balance support: Feet supported, Bilateral upper extremity supported Sitting balance-Leahy Scale: Fair     Standing balance support: Bilateral upper extremity supported, During functional activity Standing balance-Leahy Scale: Fair Standing balance comment: pt is unsteady but mostly due to impulsivity versus balance deficits       Cognition Arousal: Alert Behavior During Therapy: Anxious, Impulsive Overall Cognitive Status: History of cognitive impairments - at baseline    General Comments: pt is alert , nude, and disoriented upon arrival. She remains confused and disoriente but was able to participate some. session greatly limited by cognition               Pertinent Vitals/Pain Pain Assessment Pain Assessment: No/denies pain Pain Score: 0-No pain     PT Goals (current goals can now be found in the care plan section) Acute Rehab PT Goals Patient Stated Goal: to feel better Progress towards PT goals: Progressing toward goals    Frequency    Min 1X/week       AM-PAC PT "6 Clicks" Mobility   Outcome Measure  Help needed turning from your back to your side while in a flat bed without using bedrails?: A Lot Help needed moving from lying on your back to sitting on the side  of a flat bed without using bedrails?: A Lot Help needed moving to and from a bed to a chair (including a wheelchair)?: A Lot Help needed standing up from a chair using your arms (e.g., wheelchair or  bedside chair)?: A Lot Help needed to walk in hospital room?: A Lot Help needed climbing 3-5 steps with a railing? : A Lot 6 Click Score: 12    End of Session   Activity Tolerance: Patient tolerated treatment well Patient left: in bed;with call bell/phone within reach;with bed alarm set Nurse Communication: Mobility status PT Visit Diagnosis: Other abnormalities of gait and mobility (R26.89);Muscle weakness (generalized) (M62.81);History of falling (Z91.81)     Time: 6045-4098 PT Time Calculation (min) (ACUTE ONLY): 17 min  Charges:    $Therapeutic Activity: 8-22 mins PT General Charges $$ ACUTE PT VISIT: 1 Visit                     Jetta Lout PTA 01/03/23, 4:44 PM

## 2023-01-03 NOTE — TOC Progression Note (Addendum)
Transition of Care [facility]) - Progression Note    Patient Details  Name: Lindsay Snyder MRN: [id number] Date of Birth: 07/26/1945  [facility] CM/SW Contact  Allena Katz, LCSW Phone Number: 01/03/2023, 10:31 AM  Clinical Narrative:   CSW lvm with patients daughter regarding rehab.    2:00pm  CSW spoke with patients daughter regarding rehab she states she wants to see if patient does alittle better with therapy as she just moved patient into [facility] two weeks ago and doesn't want to risk patient getting confused again by being moved again unless its neccessary. She states that ultimately if rehab is needed she will be agreeable.       Expected Discharge Plan and Services                                               Social Determinants of Health (SDOH) Interventions SDOH Screenings   Food Insecurity: No Food Insecurity (01/01/2023)  Housing: Low Risk  (01/01/2023)  Transportation Needs: No Transportation Needs (01/01/2023)  Utilities: Not At Risk (01/01/2023)  Alcohol Screen: Low Risk  (05/17/2022)  Depression (PHQ2-9): Low Risk  (05/17/2022)  Recent Concern: Depression (PHQ2-9) - High Risk (03/01/2022)  Financial Resource Strain: Low Risk  (09/04/2022)  Physical Activity: Inactive (09/04/2022)  Social Connections: Moderately Integrated (09/04/2022)  Stress: No Stress Concern Present (09/04/2022)  Tobacco Use: Low Risk  (01/01/2023)    Readmission Risk Interventions     No data to display

## 2023-01-03 NOTE — Plan of Care (Signed)
  Problem: Education: Goal: Knowledge of General Education information will improve Description: Including pain rating scale, medication(s)/side effects and non-pharmacologic comfort measures Outcome: Progressing   Problem: Health Behavior/Discharge Planning: Goal: Ability to manage health-related needs will improve Outcome: Progressing   Problem: Clinical Measurements: Goal: Ability to maintain clinical measurements within normal limits will improve Outcome: Progressing Goal: Will remain free from infection Outcome: Progressing Goal: Diagnostic test results will improve Outcome: Progressing Goal: Respiratory complications will improve Outcome: Progressing Goal: Cardiovascular complication will be avoided Outcome: Progressing   Problem: Activity: Goal: Risk for activity intolerance will decrease Outcome: Progressing   Problem: Nutrition: Goal: Adequate nutrition will be maintained Outcome: Progressing   Problem: Coping: Goal: Level of anxiety will decrease Outcome: Progressing   Problem: Elimination: Goal: Will not experience complications related to bowel motility Outcome: Progressing Goal: Will not experience complications related to urinary retention Outcome: Progressing   Problem: Pain Management: Goal: General experience of comfort will improve Outcome: Progressing   Problem: Safety: Goal: Ability to remain free from injury will improve Outcome: Progressing   Problem: Skin Integrity: Goal: Risk for impaired skin integrity will decrease Outcome: Progressing   Problem: Urinary Elimination: Goal: Signs and symptoms of infection will decrease Outcome: Progressing

## 2023-01-03 NOTE — Progress Notes (Signed)
Progress Note   Patient: Lindsay Snyder ZOX:[id number] DOB: 05-29-1945 DOA: 01/01/2023     2 DOS: the patient was seen and examined on 01/03/2023    Brief hospital course: From HPI "Lindsay Snyder is a 77 y.o. female with medical history significant of Alzheimer's dementia, frequent UTIs, C. difficile colitis, anxiety/depression, sent from the facility for evaluation of fall and altered mentations.   Assessment and Plan:   Acute metabolic encephalopathy Acute aphasia-improved -Secondary UTI -According to facility staff and patient's daughter over the phone, patient tend to have mentation changes and slurred speech in her past episode of recurrent UTI which she has had 3 episodes this summer along.  However patient did not tolerate suppressive antibiotic treatment due to recurrent C. difficile colitis. Patient still confused and have taken out IV lines about 3 times Antibiotics have been switched to Levaquin given previous history of Pseudomonas MRI of the brain did not show any acute intracranial pathology Infectious disease consulted  Hypokalemia Continue repletion and monitoring   UTI, recurrent -Reviewed the past 3 urine culture result in summer 2024 showed different organisms on each occasions, which includes Porteous, Enterobacter and Pseudomonas Continue current antibiotics   Rhabdomyolysis -Patient received IV fluid CPK improving   Elevated troponins -Denies any chest pain, repeat EKG showed no significant ST changes Echocardiogram showed EF 60 to 65% Monitor telemetry   Advanced dementia -Hold off memantine and Aricept today -Continue Remeron and lithium   DVT prophylaxis: Lovenox   Code Status: Full code   Family Communication: Son and daughter over the phone   Disposition Plan: Pending clinical course   Consults called: None Admission status: Telemetry admission     Subjective:  Patient seen and examined at bedside this morning Appears confused Having  word salad Unable to tell me why she is Will obtain MRI of the brain Patient has taking out about 3 IV lines out today Antibiotics switched to p.o.   Physical Exam: ENMT: Mucous membranes are moist. Posterior pharynx clear of any exudate or lesions.Normal dentition.  Neck: normal, supple, no masses, no thyromegaly Respiratory: clear to auscultation bilaterally, no wheezing, no crackles. Normal respiratory effort. No accessory muscle use.  Cardiovascular: Regular rate and rhythm, no murmurs / rubs / gallops. No extremity edema. 2+ pedal pulses. No carotid bruits.  Abdomen: no tenderness, no masses palpated. No hepatosplenomegaly. Bowel sounds positive.  Musculoskeletal: no clubbing / cyanosis. No joint deformity upper and lower extremities. Good ROM, no contractures. Normal muscle tone.  Skin: no rashes, lesions, ulcers. No induration Neurologic: Confused with word salad  Family Communication: None present at bedside   Disposition: Status is: Inpatient   Time spent: 46 minutes    Data Reviewed: I have reviewed patient's CT scan of the brain that did not show any acute intracranial pathology      Latest Ref Rng & Units 01/03/2023    4:55 AM 01/02/2023    7:05 AM 01/01/2023    1:12 AM  BMP  Glucose 70 - 99 mg/dL 89  89  811   BUN 8 - 23 mg/dL 8  9  15    Creatinine 0.44 - 1.00 mg/dL 9.14  7.82  9.56   Sodium 135 - 145 mmol/L 139  142  136   Potassium 3.5 - 5.1 mmol/L 2.9  3.4  3.0   Chloride 98 - 111 mmol/L 108  114  103   CO2 22 - 32 mmol/L 23  22  23    Calcium 8.9 -  10.3 mg/dL 8.3  8.1  8.5       Vitals:   01/02/23 1729 01/02/23 1934 01/03/23 0326 01/03/23 0812  BP: 127/66 (!) 140/92 (!) 143/67 (!) 145/72  Pulse: 85 71 65 66  Resp:  18 18 16   Temp: 99.6 F (37.6 C) 98.8 F (37.1 C) 98.4 F (36.9 C) 98.6 F (37 C)  TempSrc:    Oral  SpO2: 98% 99% 100% 99%  Weight:      Height:          Latest Ref Rng & Units 01/03/2023    4:55 AM 01/01/2023    1:12 AM 09/06/2022    12:26 PM  CBC  WBC 4.0 - 10.5 K/uL 8.9  12.6  8.2   Hemoglobin 12.0 - 15.0 g/dL 29.5  28.4  13.2   Hematocrit 36.0 - 46.0 % 41.6  41.8  45.0   Platelets 150 - 400 K/uL 276  248  222.0      Author: Loyce Dys, MD 01/03/2023 4:22 PM  For on call review [email or web address].

## 2023-01-03 NOTE — Progress Notes (Signed)
RN clicked on the wrong alertness and then corrected it later. Patient was not an actual yellow MEWS this shift.

## 2023-01-04 ENCOUNTER — Telehealth: Payer: Self-pay | Admitting: Primary Care

## 2023-01-04 ENCOUNTER — Encounter: Payer: Self-pay | Admitting: *Deleted

## 2023-01-04 DIAGNOSIS — G9340 Encephalopathy, unspecified: Secondary | ICD-10-CM | POA: Diagnosis not present

## 2023-01-04 LAB — URINALYSIS, COMPLETE (UACMP) WITH MICROSCOPIC
Bilirubin Urine: NEGATIVE
Glucose, UA: NEGATIVE mg/dL
Hgb urine dipstick: NEGATIVE
Ketones, ur: 5 mg/dL — AB
Nitrite: NEGATIVE
Protein, ur: NEGATIVE mg/dL
Specific Gravity, Urine: 1.016 (ref 1.005–1.030)
pH: 6 (ref 5.0–8.0)

## 2023-01-04 LAB — CBC WITH DIFFERENTIAL/PLATELET
Abs Immature Granulocytes: 0.12 10*3/uL — ABNORMAL HIGH (ref 0.00–0.07)
Basophils Absolute: 0 10*3/uL (ref 0.0–0.1)
Basophils Relative: 1 %
Eosinophils Absolute: 0.4 10*3/uL (ref 0.0–0.5)
Eosinophils Relative: 4 %
HCT: 38.7 % (ref 36.0–46.0)
Hemoglobin: 13.1 g/dL (ref 12.0–15.0)
Immature Granulocytes: 1 %
Lymphocytes Relative: 18 %
Lymphs Abs: 1.5 10*3/uL (ref 0.7–4.0)
MCH: 29.6 pg (ref 26.0–34.0)
MCHC: 33.9 g/dL (ref 30.0–36.0)
MCV: 87.6 fL (ref 80.0–100.0)
Monocytes Absolute: 0.9 10*3/uL (ref 0.1–1.0)
Monocytes Relative: 11 %
Neutro Abs: 5.7 10*3/uL (ref 1.7–7.7)
Neutrophils Relative %: 65 %
Platelets: 276 10*3/uL (ref 150–400)
RBC: 4.42 MIL/uL (ref 3.87–5.11)
RDW: 12.6 % (ref 11.5–15.5)
WBC: 8.6 10*3/uL (ref 4.0–10.5)
nRBC: 0 % (ref 0.0–0.2)

## 2023-01-04 LAB — BASIC METABOLIC PANEL
Anion gap: 8 (ref 5–15)
BUN: 6 mg/dL — ABNORMAL LOW (ref 8–23)
CO2: 24 mmol/L (ref 22–32)
Calcium: 8.3 mg/dL — ABNORMAL LOW (ref 8.9–10.3)
Chloride: 108 mmol/L (ref 98–111)
Creatinine, Ser: 0.8 mg/dL (ref 0.44–1.00)
GFR, Estimated: >60 mL/min (ref >60)
Glucose, Bld: 114 mg/dL — ABNORMAL HIGH (ref 70–99)
Potassium: 3.6 mmol/L (ref 3.5–5.1)
Sodium: 140 mmol/L (ref 135–145)

## 2023-01-04 NOTE — Patient Instructions (Signed)
Visit Information  Thank you for taking time to visit with me today. Please don't hesitate to contact me if I can be of assistance to you.   Following are the goals we discussed today:   Goals Addressed             This Visit's Progress    Fl2 requested       Activities and task to complete in order to accomplish goals.   Fl2 faxed to patient's provider's office today-update needed to complete application process for special assistance medicaid          Our next appointment is by telephone on 01/18/23 at 3pm  Please call the care guide team at [phone] if you need to cancel or reschedule your appointment.   If you are experiencing a Mental Health or Behavioral Health Crisis or need someone to talk to, please call 911   Patient verbalizes understanding of instructions and care plan provided today and agrees to view in MyChart. Active MyChart status and patient understanding of how to access instructions and care plan via MyChart confirmed with patient.     Telephone follow up appointment with care management team member scheduled for: 01/18/23  [name], LCSW [place]  [facility], [facility] Licensed Clinical Social Worker Care Coordinator  Direct Dial: [phone]

## 2023-01-04 NOTE — Patient Outreach (Signed)
  Care Coordination   Collaboration  Visit Note   01/04/2023 Name: Lindsay Snyder MRN: [id number] DOB: [birth year]  Lindsay Snyder is a 77 y.o. year old female who sees Doreene Nest, NP for primary care. I  received the updated Fl2 from patient's provider  What matters to the patients health and wellness today?  Updated Fl2 to complete the Special Assistance Medicaid application. Patient currently resides in an Assisted Living[facility].   Goals Addressed             This Visit's Progress    Fl2 requested       Activities and task to complete in order to accomplish goals.   Fl2 received faxed to patient's provider's office today-update needed to complete application process for special assistance medicaid  Updated Fl2 forwarded to patient's daughter         SDOH assessments and interventions completed:  No     Care Coordination Interventions:  Yes, provided  Interventions Today    Flowsheet Row Most Recent Value  General Interventions   General Interventions Discussed/Reviewed Communication with  Communication with PCP/Specialists  [updated Fl2 received from primary care office, securely  forwarded to patient's daughter for completion of special assistance medicaid application]       Follow up plan: No further intervention required.   Encounter Outcome:  Patient Visit Completed

## 2023-01-04 NOTE — Plan of Care (Signed)
  Problem: Education: Goal: Knowledge of General Education information will improve Description: Including pain rating scale, medication(s)/side effects and non-pharmacologic comfort measures Outcome: Progressing   Problem: Health Behavior/Discharge Planning: Goal: Ability to manage health-related needs will improve Outcome: Progressing   Problem: Clinical Measurements: Goal: Ability to maintain clinical measurements within normal limits will improve Outcome: Progressing Goal: Will remain free from infection Outcome: Progressing Goal: Diagnostic test results will improve Outcome: Progressing Goal: Respiratory complications will improve Outcome: Progressing Goal: Cardiovascular complication will be avoided Outcome: Progressing   Problem: Activity: Goal: Risk for activity intolerance will decrease Outcome: Progressing   Problem: Nutrition: Goal: Adequate nutrition will be maintained Outcome: Progressing   Problem: Coping: Goal: Level of anxiety will decrease Outcome: Progressing   Problem: Elimination: Goal: Will not experience complications related to bowel motility Outcome: Progressing Goal: Will not experience complications related to urinary retention Outcome: Progressing   Problem: Pain Management: Goal: General experience of comfort will improve Outcome: Progressing   Problem: Safety: Goal: Ability to remain free from injury will improve Outcome: Progressing   Problem: Skin Integrity: Goal: Risk for impaired skin integrity will decrease Outcome: Progressing   Problem: Urinary Elimination: Goal: Signs and symptoms of infection will decrease Outcome: Progressing

## 2023-01-04 NOTE — Progress Notes (Signed)
Occupational Therapy Treatment Patient Details Name: Lindsay Snyder MRN: [id number] DOB: Oct 31, 1945 Today's Date: 01/04/2023   History of present illness Pt is a 77 y.o. female presenting to hospital 01/01/23 with c/o AMS and fall.  Pt admitted with acute metabolic encephalopathy, acute aphasia, recurrent UTI, rhabdomyolysis.  PMH includes anxiety, appendicitis, c-diff, dementia.   OT comments  Pt received with LE elevated in recliner. Appearing alert; watching TV; willing to work with OT on grooming, transfer to [facility] for toileting, and eating lunch. T/f MIN A with RW; poor motor planning; significant cognitive deficits. See flowsheet below for further details of session. Left seated in recliner, with all needs in reach. Nursing made aware that pt cognitively not likely to use call bell and that she does not have a chair alarm engaged but OT would recommend one.  Patient will benefit from continued OT while in acute care.       If plan is discharge home, recommend the following:  A lot of help with bathing/dressing/bathroom;Two people to help with walking and/or transfers;Assistance with cooking/housework;Help with stairs or ramp for entrance;Direct supervision/assist for financial management;Supervision due to cognitive status;Direct supervision/assist for medications management;Assist for transportation   Equipment Recommendations  Other (comment) (defer)    Recommendations for Other Services      Precautions / Restrictions Precautions Precautions: Fall Restrictions Weight Bearing Restrictions: No       Mobility Bed Mobility               General bed mobility comments: Bed mobility not assessed; pt found in recliner and returned to recliner.    Transfers Overall transfer level: Needs assistance Equipment used: Rolling walker (2 wheels) Transfers: Sit to/from Stand, Bed to chair/wheelchair/BSC Sit to Stand: Min assist Stand pivot transfers: Min assist         General  transfer comment: MIN A for physical assist and multiple verbal and tactile cues for cognition/motor planning.     Balance Overall balance assessment: Needs assistance Sitting-balance support: Feet supported, Bilateral upper extremity supported Sitting balance-Leahy Scale: Fair     Standing balance support: Bilateral upper extremity supported, During functional activity Standing balance-Leahy Scale: Fair Standing balance comment: impulsive; poor motor planning                           ADL either performed or assessed with clinical judgement   ADL Overall ADL's : Needs assistance/impaired Eating/Feeding: Supervision/ safety;Set up;Sitting Eating/Feeding Details (indicate cue type and reason): seated in chair; OT presented food; pt able to say which condiments she liked when OT gave choice one by one. Pt was able to pick up hamburger with hands and eat it. Grooming: Wash/dry hands;Oral care;Supervision/safety;Set up;Cueing for sequencing;Sitting Grooming Details (indicate cue type and reason): seated in chair; pt able to open toothpaste, appropriately apply to toothbrush; OT cue needed for next step of sequence (begin brushing); OT provided basin for spitting after pt rinsed, but pt instead spit back into the cup of water. OT provided warm washcloth and instructed pt to wash her face, but she wiped her hands instead. Pt distracted by the large bruise on her forearm; appeared distressed by it, but able to be redirected.             Lower Body Dressing: Total assistance;Sitting/lateral leans Lower Body Dressing Details (indicate cue type and reason): Total assist for changing socks (got urine on them while standing to get to toilet). Toilet Transfer: Minimal assistance;Cueing for  safety;Cueing for sequencing;Stand-pivot;Rolling walker (2 wheels);BSC/3in1 Toilet Transfer Details (indicate cue type and reason): Pt requiring lots of strong verbal and tactile cues for t/f to [facility] next  to chair, as pt not understanding the need to move her hips back to line up with the commode and instead started moving the commode away from her; eventually able to be cued enough to complete the task. Poor RW management and motor planning.         Functional mobility during ADLs: Minimal assistance;Cueing for safety;Cueing for sequencing;Rolling walker (2 wheels) General ADL Comments: Lots of assistance mostly for cognition    Extremity/Trunk Assessment Upper Extremity Assessment Upper Extremity Assessment: Generalized weakness   Lower Extremity Assessment Lower Extremity Assessment: Generalized weakness        Vision       Perception     Praxis Praxis Praxis: Impaired Praxis Impairment Details: Ideation Praxis-Other Comments: Pt required OT visual cue to know the next step in toothbrushing sequence once toothpaste on toothbrush (OT showed "brushing" gesture and pt was able to begin brushing).    Cognition Arousal: Alert Behavior During Therapy: Anxious, Impulsive Overall Cognitive Status: History of cognitive impairments - at baseline                                 General Comments: Alert; up in chair; lots of word salad and misunderstanding of language today. Does well with automatic speech ("Have a good day" pt will respond "you too"); very poor motor planning. Is able to state that she needs to use the bathroom. Requires some cues for use of tools (toothbrush).        Exercises      Shoulder Instructions       General Comments On room air; no distress; no signs of orthostasis    Pertinent Vitals/ Pain       Pain Assessment Pain Assessment: No/denies pain  Home Living                                          Prior Functioning/Environment              Frequency  Min 1X/week        Progress Toward Goals  OT Goals(current goals can now be found in the care plan section)  Progress towards OT goals: Progressing  toward goals  Acute Rehab OT Goals Patient Stated Goal: Get better OT Goal Formulation: With patient Time For Goal Achievement: 01/15/23 Potential to Achieve Goals: Good ADL Goals Pt Will Perform Grooming: with supervision;sitting Pt Will Perform Lower Body Bathing: sitting/lateral leans;sit to/from stand;with mod assist;with min assist Pt Will Perform Upper Body Dressing: sitting;with contact guard assist Pt Will Perform Lower Body Dressing: with min assist;with mod assist;sit to/from stand;sitting/lateral leans Pt Will Transfer to Toilet: with mod assist;with min assist;bedside commode Additional ADL Goal #1: Pt will demo bed mobility tasks with SBA and good safety to promote IND/return to PLOF.  Plan      Co-evaluation                 AM-PAC OT "6 Clicks" Daily Activity     Outcome Measure   Help from another person eating meals?: A Little Help from another person taking care of personal grooming?: A Little Help from another person toileting, which includes using  toliet, bedpan, or urinal?: A Lot Help from another person bathing (including washing, rinsing, drying)?: Total Help from another person to put on and taking off regular upper body clothing?: A Lot Help from another person to put on and taking off regular lower body clothing?: Total 6 Click Score: 12    End of Session Equipment Utilized During Treatment: Rolling walker (2 wheels);Other (comment) (BSC)  OT Visit Diagnosis: Other abnormalities of gait and mobility (R26.89);Unsteadiness on feet (R26.81);Muscle weakness (generalized) (M62.81);History of falling (Z91.81)   Activity Tolerance Patient tolerated treatment well   Patient Left in chair;with call bell/phone within reach   Nurse Communication Mobility status;Other (comment) (aware that no chair alarm on and pt should be using one)        Time: 5784-6962 OT Time Calculation (min): 38 min  Charges: OT General Charges $OT Visit: 1 Visit OT  Treatments $Self Care/Home Management : 38-52 mins  Linward Foster, MS, OTR/L   Alvester Morin 01/04/2023, 2:23 PM

## 2023-01-04 NOTE — Patient Outreach (Signed)
  Care Coordination   Follow Up Visit Note   01/04/2023 Name: Lindsay Snyder MRN: [id number] DOB: 06-03-1945  Lindsay Snyder is a 77 y.o. year old female who sees Doreene Nest, NP for primary care. I spoke with  Lindsay Snyder daughter Lindsay Snyder by phone on 01/03/23.  What matters to the patients health and wellness today?  Updated Fl2    Goals Addressed             This Visit's Progress    Fl2 requested       Activities and task to complete in order to accomplish goals.   Fl2 faxed to patient's provider's office today-update needed to complete application process for special assistance medicaid          SDOH assessments and interventions completed:  No     Care Coordination Interventions:  Yes, provided  Interventions Today    Flowsheet Row Most Recent Value  Chronic Disease   Chronic disease during today's visit --  [dementia]  General Interventions   General Interventions Discussed/Reviewed General Interventions Reviewed, Level of Care  [confirmed that patient is currently hospitalized due to a UTI, plan is to return to [facility]]  Level of Care Assisted Living, Applications  [patient currently placed in Assisted Living-[facility]]  Applications FL-2  [updated Fl2 needed to completed special assistance medicaid applciation for placement-Fl2 faxed to provider's office for completion]  Education Interventions   Applications FL-2  [updated Fl2 needed to completed special assistance medicaid applciation for placement-Fl2 faxed to provider's office for completion]       Follow up plan: Follow up call scheduled for 01/18/23    Encounter Outcome:  Patient Visit Completed

## 2023-01-04 NOTE — TOC Progression Note (Signed)
Transition of Care [facility]) - Progression Note    Patient Details  Name: Lindsay Snyder MRN: [id number] Date of Birth: [birth year]  Transition [facility] Care [facility]) CM/SW Contact  Allena Katz, LCSW Phone Number: 01/04/2023, 8:40 AM  Clinical Narrative:   Daughter reports she would like to see if [facility] [place] [facility] can take patient back instead of her going for rehab.         Expected Discharge Plan and Services                                               Social Determinants of Health (SDOH) Interventions SDOH Screenings   Food Insecurity: No Food Insecurity (01/01/2023)  Housing: Low Risk  (01/01/2023)  Transportation Needs: No Transportation Needs (01/01/2023)  Utilities: Not At Risk (01/01/2023)  Alcohol Screen: Low Risk  (05/17/2022)  Depression (PHQ2-9): Low Risk  (05/17/2022)  Recent Concern: Depression (PHQ2-9) - High Risk (03/01/2022)  Financial Resource Strain: Low Risk  (09/04/2022)  Physical Activity: Inactive (09/04/2022)  Social Connections: Moderately Integrated (09/04/2022)  Stress: No Stress Concern Present (09/04/2022)  Tobacco Use: Low Risk  (01/01/2023)    Readmission Risk Interventions     No data to display

## 2023-01-04 NOTE — Telephone Encounter (Signed)
Form faxed to number listed on paper

## 2023-01-04 NOTE — Plan of Care (Signed)

## 2023-01-04 NOTE — Progress Notes (Signed)
NOTIFIED ON CALL PROVIDER THAT UA WAS OBTAINED PER ORDER AND THE RESULTS ARE BACK AND APPEAR TO BE POSITIVE. QUESTIONED IF SHE WANTS TO PLACE ANY NEW ORDERS.

## 2023-01-04 NOTE — Progress Notes (Signed)
Progress Note   Patient: Lindsay Snyder:[id number] DOB: [birth year] DOA: 01/01/2023     3 DOS: the patient was seen and examined on 01/04/2023     Brief hospital course: From HPI "Lindsay Snyder is a 77 y.o. female with medical history significant of Alzheimer's dementia, frequent UTIs, C. difficile colitis, anxiety/depression, sent from the facility for evaluation of fall and altered mentations.   Assessment and Plan:   Acute metabolic encephalopathy Acute aphasia-improved -Secondary UTI -According to facility staff and patient's daughter over the phone, patient tend to have mentation changes and slurred speech in her past episode of recurrent UTI which she has had 3 episodes this summer along.  However patient did not tolerate suppressive antibiotic treatment due to recurrent C. difficile colitis. Patient still confused and have taken out IV lines about 3 times Antibiotics have been switched to Levaquin given previous history of Pseudomonas MRI of the brain did not show any acute intracranial pathology Infectious disease consulted   Hypokalemia Continue repletion and monitoring   UTI, recurrent -Reviewed the past 3 urine culture result in summer 2024 showed different organisms on each occasions, which includes Porteous, Enterobacter and Pseudomonas Continue current antibiotics   Rhabdomyolysis -Patient received IV fluid CPK improving   Elevated troponins -Denies any chest pain, repeat EKG showed no significant ST changes Echocardiogram showed EF 60 to 65% Monitor telemetry   Advanced dementia Fall precaution Continue outpatient dementia medications   DVT prophylaxis: Lovenox   Code Status: Full code   Family Communication: Son and daughter over the phone   Disposition Plan: Pending clinical course   Consults called: None Admission status: Telemetry admission     Subjective:  Patient still having tangential speech Still confused Denies nausea  vomiting Mental status slightly better today than yesterday   Physical Exam: ENMT: Mucous membranes are moist. Posterior pharynx clear of any exudate or lesions.Normal dentition.  Neck: normal, supple, no masses, no thyromegaly Respiratory: clear to auscultation bilaterally, no wheezing, no crackles. Normal respiratory effort. No accessory muscle use.  Cardiovascular: Regular rate and rhythm, no murmurs / rubs / gallops. No extremity edema. 2+ pedal pulses. No carotid bruits.  Abdomen: no tenderness, no masses palpated. No hepatosplenomegaly. Bowel sounds positive.  Musculoskeletal: no clubbing / cyanosis. No joint deformity upper and lower extremities. Good ROM, no contractures. Normal muscle tone.  Skin: no rashes, lesions, ulcers. No induration Neurologic: Confused with word salad   Family Communication: None present at bedside   Disposition: Status is: Inpatient   Time spent: 41 minutes     Data Reviewed:     Latest Ref Rng & Units 01/04/2023    4:31 AM 01/03/2023    4:55 AM 01/02/2023    7:05 AM  BMP  Glucose 70 - 99 mg/dL 478  89  89   BUN 8 - 23 mg/dL 6  8  9    Creatinine 0.44 - 1.00 mg/dL 2.95  6.21  3.08   Sodium 135 - 145 mmol/L 140  139  142   Potassium 3.5 - 5.1 mmol/L 3.6  2.9  3.4   Chloride 98 - 111 mmol/L 108  108  114   CO2 22 - 32 mmol/L 24  23  22    Calcium 8.9 - 10.3 mg/dL 8.3  8.3  8.1     Vitals:   01/03/23 1724 01/03/23 2125 01/04/23 0437 01/04/23 0914  BP: (!) 153/73 (!) 148/70 (!) 152/78 (!) 125/54  Pulse: 72 74 69 68  Resp: 16  16 19 19   Temp: 98.3 F (36.8 C) 98.2 F (36.8 C) 98.1 F (36.7 C) 98.1 F (36.7 C)  TempSrc: Oral Oral    SpO2: 98% 98% 98% 98%  Weight:      Height:          Latest Ref Rng & Units 01/04/2023    4:31 AM 01/03/2023    4:55 AM 01/01/2023    1:12 AM  CBC  WBC 4.0 - 10.5 K/uL 8.6  8.9  12.6   Hemoglobin 12.0 - 15.0 g/dL 30.8  65.7  84.6   Hematocrit 36.0 - 46.0 % 38.7  41.6  41.8   Platelets 150 - 400 K/uL 276  276   248     Author: Loyce Dys, MD 01/04/2023 3:34 PM  For on call review [email or web address].

## 2023-01-04 NOTE — Telephone Encounter (Addendum)
FL 2 form completed.  Will fax to Child psychotherapist.    ----- Message from [name] [place] sent at 01/04/2023  8:57 AM EST ----- Lysle Morales,  [facility] you are well!  Would you mind completing an update Fl2 form for Ms. Megna. The Dept of Social Services is needing this in order to approve her Special Assistance Medicaid for placement at the [facility].  I will fax the form to you this morning.   Thanks!   Chrystal Land, LCSW [place]  [facility], [facility] Licensed Clinical Social Worker Care Coordinator  Direct Dial: [phone]

## 2023-01-05 DIAGNOSIS — G9340 Encephalopathy, unspecified: Secondary | ICD-10-CM | POA: Diagnosis not present

## 2023-01-05 LAB — CBC WITH DIFFERENTIAL/PLATELET
Abs Immature Granulocytes: 0.18 10*3/uL — ABNORMAL HIGH (ref 0.00–0.07)
Basophils Absolute: 0.1 10*3/uL (ref 0.0–0.1)
Basophils Relative: 1 %
Eosinophils Absolute: 0.4 10*3/uL (ref 0.0–0.5)
Eosinophils Relative: 5 %
HCT: 41.8 % (ref 36.0–46.0)
Hemoglobin: 13.6 g/dL (ref 12.0–15.0)
Immature Granulocytes: 2 %
Lymphocytes Relative: 24 %
Lymphs Abs: 2 10*3/uL (ref 0.7–4.0)
MCH: 29.5 pg (ref 26.0–34.0)
MCHC: 32.5 g/dL (ref 30.0–36.0)
MCV: 90.7 fL (ref 80.0–100.0)
Monocytes Absolute: 0.8 10*3/uL (ref 0.1–1.0)
Monocytes Relative: 10 %
Neutro Abs: 4.6 10*3/uL (ref 1.7–7.7)
Neutrophils Relative %: 58 %
Platelets: 263 10*3/uL (ref 150–400)
RBC: 4.61 MIL/uL (ref 3.87–5.11)
RDW: 13 % (ref 11.5–15.5)
WBC: 8.1 10*3/uL (ref 4.0–10.5)
nRBC: 0 % (ref 0.0–0.2)

## 2023-01-05 LAB — BASIC METABOLIC PANEL
Anion gap: 7 (ref 5–15)
BUN: 10 mg/dL (ref 8–23)
CO2: 25 mmol/L (ref 22–32)
Calcium: 8.8 mg/dL — ABNORMAL LOW (ref 8.9–10.3)
Chloride: 109 mmol/L (ref 98–111)
Creatinine, Ser: 0.95 mg/dL (ref 0.44–1.00)
GFR, Estimated: >60 mL/min (ref >60)
Glucose, Bld: 104 mg/dL — ABNORMAL HIGH (ref 70–99)
Potassium: 3.6 mmol/L (ref 3.5–5.1)
Sodium: 141 mmol/L (ref 135–145)

## 2023-01-05 NOTE — Plan of Care (Signed)
  Problem: Clinical Measurements: Goal: Diagnostic test results will improve Outcome: Progressing Goal: Respiratory complications will improve Outcome: Progressing Goal: Cardiovascular complication will be avoided Outcome: Progressing   Problem: Nutrition: Goal: Adequate nutrition will be maintained Outcome: Progressing   Problem: Elimination: Goal: Will not experience complications related to bowel motility Outcome: Progressing Goal: Will not experience complications related to urinary retention Outcome: Progressing   Problem: Pain Management: Goal: General experience of comfort will improve Outcome: Progressing   Problem: Safety: Goal: Ability to remain free from injury will improve Outcome: Progressing   Problem: Skin Integrity: Goal: Risk for impaired skin integrity will decrease Outcome: Progressing

## 2023-01-05 NOTE — Plan of Care (Addendum)
Patient is alert and oriented x2. Discharge instruction given to her daughter( Holzheimer lucretia). No any questions at this time.   Problem: Education: Goal: Knowledge of General Education information will improve Description: Including pain rating scale, medication(s)/side effects and non-pharmacologic comfort measures Outcome: Completed/Met   Problem: Health Behavior/Discharge Planning: Goal: Ability to manage health-related needs will improve Outcome: Completed/Met   Problem: Clinical Measurements: Goal: Ability to maintain clinical measurements within normal limits will improve Outcome: Completed/Met Goal: Will remain free from infection Outcome: Completed/Met Goal: Diagnostic test results will improve Outcome: Completed/Met Goal: Respiratory complications will improve Outcome: Completed/Met Goal: Cardiovascular complication will be avoided Outcome: Completed/Met   Problem: Activity: Goal: Risk for activity intolerance will decrease Outcome: Completed/Met   Problem: Nutrition: Goal: Adequate nutrition will be maintained Outcome: Completed/Met   Problem: Coping: Goal: Level of anxiety will decrease Outcome: Completed/Met   Problem: Elimination: Goal: Will not experience complications related to bowel motility Outcome: Completed/Met Goal: Will not experience complications related to urinary retention Outcome: Completed/Met   Problem: Pain Management: Goal: General experience of comfort will improve Outcome: Completed/Met   Problem: Safety: Goal: Ability to remain free from injury will improve Outcome: Completed/Met   Problem: Skin Integrity: Goal: Risk for impaired skin integrity will decrease Outcome: Completed/Met   Problem: Urinary Elimination: Goal: Signs and symptoms of infection will decrease Outcome: Completed/Met

## 2023-01-05 NOTE — TOC Progression Note (Signed)
Transition of Care [facility]) - Progression Note    Patient Details  Name: Lindsay Snyder MRN: [id number] Date of Birth: 08-27-1945  Transition of Care [facility]) CM/SW Contact  Susa Simmonds, [place] Phone Number: 01/05/2023, 10:25 AM  Clinical Narrative:  CSW contacted [facility] [phone] to find out if patient can return when medically ready.  CSW was not able to get a staff member to answer the phone. CSW was prompted to leave a message with Morene Antu, executive director. Mailbox was full.          Expected Discharge Plan and Services                                               Social Determinants of Health (SDOH) Interventions SDOH Screenings   Food Insecurity: No Food Insecurity (01/01/2023)  Housing: Low Risk  (01/01/2023)  Transportation Needs: No Transportation Needs (01/01/2023)  Utilities: Not At Risk (01/01/2023)  Alcohol Screen: Low Risk  (05/17/2022)  Depression (PHQ2-9): Low Risk  (05/17/2022)  Recent Concern: Depression (PHQ2-9) - High Risk (03/01/2022)  Financial Resource Strain: Low Risk  (09/04/2022)  Physical Activity: Inactive (09/04/2022)  Social Connections: Moderately Integrated (09/04/2022)  Stress: No Stress Concern Present (09/04/2022)  Tobacco Use: Low Risk  (01/01/2023)    Readmission Risk Interventions     No data to display

## 2023-01-05 NOTE — Discharge Summary (Signed)
Physician Discharge Summary   Patient: Lindsay Snyder MRN: [id number] DOB: August 17, 1945  Admit date:     01/01/2023  Discharge date: 01/05/23  Discharge Physician: Loyce Dys   PCP: Doreene Nest, NP   Recommendations at discharge:  Follow-up with primary care physician  Discharge Diagnoses: Acute metabolic encephalopathy Acute aphasia-improved -Secondary UTI Hypokalemia-resolved UTI, recurrent Rhabdomyolysis Elevated troponins due to demand ischemia Advanced dementia  Hospital Course: Lindsay Snyder is a 77 y.o. female with medical history significant of Alzheimer's dementia, frequent UTIs, C. difficile colitis, anxiety/depression, sent from the facility for evaluation of fall and altered mentation.  Patient was found to have urinary tract infection and underwent antibiotic therapy with completion.  Mental status improved to baseline and have therefore been cleared for discharge back to facility today.  Consultants: None Procedures performed: None Disposition: Home Diet recommendation:  Cardiac diet DISCHARGE MEDICATION: Allergies as of 01/05/2023       Reactions   Crestor [rosuvastatin Calcium] Rash, Other (See Comments)   Myalgia   Tylenol [acetaminophen] Rash        Medication List     TAKE these medications    cetirizine 10 MG tablet Commonly known as: ZYRTEC Take 10 mg by mouth daily.   diclofenac Sodium 1 % Gel Commonly known as: VOLTAREN Apply 4 g topically 3 (three) times daily as needed (pain). (Apply to affected area of legs)   donepezil 5 MG tablet Commonly known as: ARICEPT Take 5 mg by mouth at bedtime.   DULoxetine 20 MG capsule Commonly known as: CYMBALTA TAKE 2 CAPSULES(40 MG) BY MOUTH DAILY FOR ANXIETY   lithium carbonate 150 MG capsule Take 150 mg by mouth daily.   memantine 5 MG tablet Commonly known as: NAMENDA Take 2 tablets (10 mg total) by mouth daily. For memory   mirtazapine 15 MG tablet Commonly known as:  REMERON Take 15 mg by mouth at bedtime.   ondansetron 4 MG disintegrating tablet Commonly known as: ZOFRAN-ODT Take 1 tablet (4 mg total) by mouth every 8 (eight) hours as needed for nausea or vomiting.   oxybutynin 15 MG 24 hr tablet Commonly known as: DITROPAN XL TAKE 1 TABLET(15 MG) BY MOUTH AT BEDTIME FOR OVERACTIVE BLADDER   traZODone 100 MG tablet Commonly known as: DESYREL Take 100 mg by mouth at bedtime.        Discharge Exam: Filed Weights   01/01/23 0058  Weight: 106.7 kg   ENMT: Mucous membranes are moist. Posterior pharynx clear of any exudate or lesions.Normal dentition.  Neck: normal, supple, no masses, no thyromegaly Respiratory: clear to auscultation bilaterally, no wheezing, no crackles. Normal respiratory effort. No accessory muscle use.  Cardiovascular: Regular rate and rhythm, no murmurs / rubs / gallops. No extremity edema. 2+ pedal pulses. No carotid bruits.  Abdomen: no tenderness, no masses palpated. No hepatosplenomegaly. Bowel sounds positive.  Musculoskeletal: no clubbing / cyanosis. No joint deformity upper and lower extremities. Good ROM, no contractures. Normal muscle tone.  Skin: no rashes, lesions, ulcers. No induration  Condition at discharge: good  The results of significant diagnostics from this hospitalization (including imaging, microbiology, ancillary and laboratory) are listed below for reference.   Imaging Studies: MR BRAIN WO CONTRAST  Result Date: 01/03/2023 CLINICAL DATA:  Transient ischemic attack EXAM: MRI HEAD WITHOUT CONTRAST TECHNIQUE: Multiplanar, multiecho pulse sequences of the brain and surrounding structures were obtained without intravenous contrast. COMPARISON:  09/20/2020 FINDINGS: Brain: No acute infarct, mass effect or extra-axial collection. No chronic microhemorrhage  or siderosis. There is multifocal hyperintense T2-weighted signal within the white matter. Generalized volume loss. The midline structures are normal.  Vascular: Normal flow voids. Skull and upper cervical spine: Normal marrow signal. Sinuses/Orbits: Right mastoid effusion. Normal orbits and paranasal sinuses. Other: None IMPRESSION: 1. No acute intracranial abnormality. 2. Findings of chronic small vessel ischemia and volume loss. Electronically Signed   By: Deatra Robinson M.D.   On: 01/03/2023 01:42   ECHOCARDIOGRAM COMPLETE  Result Date: 01/02/2023    ECHOCARDIOGRAM REPORT   Patient Name:   Lindsay Snyder Date of Exam: 01/02/2023 Medical Rec #:  [id number]      Height:       62.0 in Accession #:    [id number]     Weight:       235.3 lb Date of Birth:  05/21/1945      BSA:          2.048 m Patient Age:    77 years       BP:           142/66 mmHg Patient Gender: F              HR:           71 bpm. Exam Location:  [facility] Procedure: 2D Echo, Cardiac Doppler and Color Doppler Indications:     Elevated Troponin  History:         Patient has prior history of Echocardiogram examinations, most                  recent 04/13/2022. CHF, Signs/Symptoms:Murmur; Risk                  Factors:Dyslipidemia and Non-Smoker. Patient with Dementia is                  unable to follow instructions.  Sonographer:     Mikki Harbor Referring Phys:  [id number] Emeline General Diagnosing Phys: Julien Nordmann MD IMPRESSIONS  1. Left ventricular ejection fraction, by estimation, is 60 to 65%. The left ventricle has normal function. The left ventricle has no regional wall motion abnormalities. Left ventricular diastolic parameters are indeterminate.  2. Right ventricular systolic function is normal. The right ventricular size is normal. There is mildly elevated pulmonary artery systolic pressure. The estimated right ventricular systolic pressure is 36.7 mmHg.  3. The mitral valve is normal in structure. Mild mitral valve regurgitation. No evidence of mitral stenosis.  4. The aortic valve is calcified. There is moderate calcification of the aortic valve. Aortic valve regurgitation is not  visualized. Mild to moderate aortic valve stenosis. Aortic valve area, by VTI measures 1.59 cm. Aortic valve mean gradient measures  17.0 mmHg. Aortic valve Vmax measures 2.75 m/s.  5. The inferior vena cava is normal in size with greater than 50% respiratory variability, suggesting right atrial pressure of 3 mmHg. FINDINGS  Left Ventricle: Left ventricular ejection fraction, by estimation, is 60 to 65%. The left ventricle has normal function. The left ventricle has no regional wall motion abnormalities. The left ventricular internal cavity size was normal in size. There is  no left ventricular hypertrophy. Left ventricular diastolic parameters are indeterminate. Right Ventricle: The right ventricular size is normal. No increase in right ventricular wall thickness. Right ventricular systolic function is normal. There is mildly elevated pulmonary artery systolic pressure. The tricuspid regurgitant velocity is 2.68  m/s, and with an assumed right atrial pressure of 8 mmHg, the estimated right ventricular systolic pressure  is 36.7 mmHg. Left Atrium: Left atrial size was normal in size. Right Atrium: Right atrial size was normal in size. Pericardium: There is no evidence of pericardial effusion. Mitral Valve: The mitral valve is normal in structure. There is mild calcification of the mitral valve leaflet(s). Mild mitral annular calcification. Mild mitral valve regurgitation. No evidence of mitral valve stenosis. MV peak gradient, 7.6 mmHg. The mean mitral valve gradient is 3.0 mmHg. Tricuspid Valve: The tricuspid valve is normal in structure. Tricuspid valve regurgitation is mild . No evidence of tricuspid stenosis. Aortic Valve: The aortic valve is calcified. There is moderate calcification of the aortic valve. Aortic valve regurgitation is not visualized. Mild aortic stenosis is present. Aortic valve mean gradient measures 17.0 mmHg. Aortic valve peak gradient measures 30.2 mmHg. Aortic valve area, by VTI measures  1.59 cm. Pulmonic Valve: The pulmonic valve was normal in structure. Pulmonic valve regurgitation is not visualized. No evidence of pulmonic stenosis. Aorta: The aortic root is normal in size and structure. Venous: The inferior vena cava is normal in size with greater than 50% respiratory variability, suggesting right atrial pressure of 3 mmHg. IAS/Shunts: No atrial level shunt detected by color flow Doppler.  LEFT VENTRICLE PLAX 2D LVIDd:         3.90 cm   Diastology LVIDs:         2.90 cm   LV e' medial:    7.94 cm/s LV PW:         1.40 cm   LV E/e' medial:  14.6 LV IVS:        1.50 cm   LV e' lateral:   11.90 cm/s LVOT diam:     2.00 cm   LV E/e' lateral: 9.7 LV SV:         107 LV SV Index:   52 LVOT Area:     3.14 cm  RIGHT VENTRICLE RV Basal diam:  3.90 cm RV Mid diam:    3.60 cm RV S prime:     14.70 cm/s LEFT ATRIUM             Index        RIGHT ATRIUM           Index LA diam:        3.70 cm 1.81 cm/m   RA Area:     14.90 cm LA Vol (A2C):   68.0 ml 33.20 ml/m  RA Volume:   37.30 ml  18.21 ml/m LA Vol (A4C):   25.2 ml 12.30 ml/m LA Biplane Vol: 43.9 ml 21.43 ml/m  AORTIC VALVE                     PULMONIC VALVE AV Area (Vmax):    1.61 cm      PV Vmax:       1.54 m/s AV Area (Vmean):   1.63 cm      PV Peak grad:  9.5 mmHg AV Area (VTI):     1.59 cm AV Vmax:           275.00 cm/s AV Vmean:          190.000 cm/s AV VTI:            0.676 m AV Peak Grad:      30.2 mmHg AV Mean Grad:      17.0 mmHg LVOT Vmax:         141.00 cm/s LVOT Vmean:  98.400 cm/s LVOT VTI:          0.342 m LVOT/AV VTI ratio: 0.51  AORTA Ao Root diam: 3.10 cm Ao Asc diam:  3.00 cm MITRAL VALVE                TRICUSPID VALVE MV Area (PHT): 3.03 cm     TR Peak grad:   28.7 mmHg MV Area VTI:   2.15 cm     TR Vmax:        268.00 cm/s MV Peak grad:  7.6 mmHg MV Mean grad:  3.0 mmHg     SHUNTS MV Vmax:       1.38 m/s     Systemic VTI:  0.34 m MV Vmean:      73.3 cm/s    Systemic Diam: 2.00 cm MV Decel Time: 250 msec MV E  velocity: 116.00 cm/s MV A velocity: 111.00 cm/s MV E/A ratio:  1.05 Julien Nordmann MD Electronically signed by Julien Nordmann MD Signature Date/Time: 01/02/2023/12:45:22 PM    Final    DG Chest Portable 1 View  Result Date: 01/01/2023 CLINICAL DATA:  Altered mental status and cough. EXAM: PORTABLE CHEST 1 VIEW COMPARISON:  March 01, 2022 FINDINGS: The cardiac silhouette is borderline in size which may be technical in origin. There is moderate severity calcification of the aortic arch. Low lung volumes are noted. Mild, diffuse, stable chronic appearing increased interstitial lung markings are seen. A chronic deformity is seen involving the head and neck of the proximal left humerus. Multilevel degenerative changes are seen throughout the thoracic spine. IMPRESSION: Low lung volumes with mild, diffuse, stable chronic appearing increased interstitial lung markings. Electronically Signed   By: Aram Candela M.D.   On: 01/01/2023 04:02   CT HEAD WO CONTRAST ( )  Result Date: 01/01/2023 CLINICAL DATA:  Altered mental status.  Head trauma. EXAM: CT HEAD WITHOUT CONTRAST CT CERVICAL SPINE WITHOUT CONTRAST TECHNIQUE: Multidetector CT imaging of the head and cervical spine was performed following the standard protocol without intravenous contrast. Multiplanar CT image reconstructions of the cervical spine were also generated. RADIATION DOSE REDUCTION: This exam was performed according to the departmental dose-optimization program which includes automated exposure control, adjustment of the mA and/or kV according to patient size and/or use of iterative reconstruction technique. COMPARISON:  01/14/2022 FINDINGS: CT HEAD FINDINGS Brain: There is no mass, hemorrhage or extra-axial collection. The size and configuration of the ventricles and extra-axial CSF spaces are normal. There is hypoattenuation of the periventricular white matter, most commonly indicating chronic ischemic microangiopathy. Vascular: No  abnormal hyperdensity of the major intracranial arteries or dural venous sinuses. No intracranial atherosclerosis. Skull: The visualized skull base, calvarium and extracranial soft tissues are normal. Sinuses/Orbits: Right mastoid effusion.  The orbits are normal. CT CERVICAL SPINE FINDINGS Alignment: No static subluxation. Facets are aligned. Occipital condyles are normally positioned. Skull base and vertebrae: No acute fracture. Soft tissues and spinal canal: No prevertebral fluid or swelling. No visible canal hematoma. Disc levels: No advanced spinal canal or neural foraminal stenosis. Upper chest: No pneumothorax, pulmonary nodule or pleural effusion. Other: The cervical spine portion of the examination is markedly degraded by motion. IMPRESSION: 1. No acute intracranial abnormality. 2. Chronic ischemic microangiopathy. 3. No acute fracture or static subluxation of the cervical spine, allowing for marked motion degradation. Electronically Signed   By: Deatra Robinson M.D.   On: 01/01/2023 03:50   CT Cervical Spine Wo Contrast  Result Date: 01/01/2023 CLINICAL DATA:  Altered mental  status.  Head trauma. EXAM: CT HEAD WITHOUT CONTRAST CT CERVICAL SPINE WITHOUT CONTRAST TECHNIQUE: Multidetector CT imaging of the head and cervical spine was performed following the standard protocol without intravenous contrast. Multiplanar CT image reconstructions of the cervical spine were also generated. RADIATION DOSE REDUCTION: This exam was performed according to the departmental dose-optimization program which includes automated exposure control, adjustment of the mA and/or kV according to patient size and/or use of iterative reconstruction technique. COMPARISON:  01/14/2022 FINDINGS: CT HEAD FINDINGS Brain: There is no mass, hemorrhage or extra-axial collection. The size and configuration of the ventricles and extra-axial CSF spaces are normal. There is hypoattenuation of the periventricular white matter, most commonly  indicating chronic ischemic microangiopathy. Vascular: No abnormal hyperdensity of the major intracranial arteries or dural venous sinuses. No intracranial atherosclerosis. Skull: The visualized skull base, calvarium and extracranial soft tissues are normal. Sinuses/Orbits: Right mastoid effusion.  The orbits are normal. CT CERVICAL SPINE FINDINGS Alignment: No static subluxation. Facets are aligned. Occipital condyles are normally positioned. Skull base and vertebrae: No acute fracture. Soft tissues and spinal canal: No prevertebral fluid or swelling. No visible canal hematoma. Disc levels: No advanced spinal canal or neural foraminal stenosis. Upper chest: No pneumothorax, pulmonary nodule or pleural effusion. Other: The cervical spine portion of the examination is markedly degraded by motion. IMPRESSION: 1. No acute intracranial abnormality. 2. Chronic ischemic microangiopathy. 3. No acute fracture or static subluxation of the cervical spine, allowing for marked motion degradation. Electronically Signed   By: Deatra Robinson M.D.   On: 01/01/2023 03:50    Microbiology: Results for orders placed or performed during the hospital encounter of 01/01/23  Blood culture (single)     Status: None (Preliminary result)   Collection Time: 01/01/23  1:10 AM   Specimen: BLOOD  Result Value Ref Range Status   Specimen Description BLOOD RIGHT ASSIST CONTROL  Final   Special Requests   Final    BOTTLES DRAWN AEROBIC AND ANAEROBIC Blood Culture adequate volume   Culture   Final    NO GROWTH 4 DAYS Performed at [facility], [address], [place], [place] [address]    Report Status PENDING  Incomplete  Resp panel by RT-PCR (RSV, Flu A&B, Covid) Anterior Nasal Swab     Status: None   Collection Time: 01/01/23  1:12 AM   Specimen: Anterior Nasal Swab  Result Value Ref Range Status   SARS Coronavirus 2 by RT PCR NEGATIVE NEGATIVE Final    Comment: (NOTE) SARS-CoV-2 target nucleic acids are NOT  DETECTED.  The SARS-CoV-2 RNA is generally detectable in upper respiratory specimens during the acute phase of infection. The lowest concentration of SARS-CoV-2 viral copies this assay can detect is 138 copies/mL. A negative result does not preclude SARS-Cov-2 infection and should not be used as the sole basis for treatment or other patient management decisions. A negative result may occur with  improper specimen collection/handling, submission of specimen other than nasopharyngeal swab, presence of viral mutation(s) within the areas targeted by this assay, and inadequate number of viral copies(<138 copies/mL). A negative result must be combined with clinical observations, patient history, and epidemiological information. The expected result is Negative.  Fact Sheet for Patients:  BloggerCourse.com  Fact Sheet for Healthcare Providers:  SeriousBroker.it  This test is no t yet approved or cleared by the [place] FDA and  has been authorized for detection and/or diagnosis of SARS-CoV-2 by FDA under an Emergency Use Authorization (EUA). This EUA will remain  in effect (meaning this test can be used) for the duration of the COVID-19 declaration under Section 564(b)(1) of the Act, 21 U.S.C.section 360bbb-3(b)(1), unless the authorization is terminated  or revoked sooner.       Influenza A by PCR NEGATIVE NEGATIVE Final   Influenza B by PCR NEGATIVE NEGATIVE Final    Comment: (NOTE) The Xpert Xpress SARS-CoV-2/FLU/RSV plus assay is intended as an aid in the diagnosis of influenza from Nasopharyngeal swab specimens and should not be used as a sole basis for treatment. Nasal washings and aspirates are unacceptable for Xpert Xpress SARS-CoV-2/FLU/RSV testing.  Fact Sheet for Patients: BloggerCourse.com  Fact Sheet for Healthcare Providers: SeriousBroker.it  This test is not yet  approved or cleared by the [place] FDA and has been authorized for detection and/or diagnosis of SARS-CoV-2 by FDA under an Emergency Use Authorization (EUA). This EUA will remain in effect (meaning this test can be used) for the duration of the COVID-19 declaration under Section 564(b)(1) of the Act, 21 U.S.C. section 360bbb-3(b)(1), unless the authorization is terminated or revoked.     Resp Syncytial Virus by PCR NEGATIVE NEGATIVE Final    Comment: (NOTE) Fact Sheet for Patients: BloggerCourse.com  Fact Sheet for Healthcare Providers: SeriousBroker.it  This test is not yet approved or cleared by the [place] FDA and has been authorized for detection and/or diagnosis of SARS-CoV-2 by FDA under an Emergency Use Authorization (EUA). This EUA will remain in effect (meaning this test can be used) for the duration of the COVID-19 declaration under Section 564(b)(1) of the Act, 21 U.S.C. section 360bbb-3(b)(1), unless the authorization is terminated or revoked.  Performed at [facility], [address] [place], [place] [address]     Labs: CBC: Recent Labs  Lab 01/01/23 0112 01/03/23 0455 01/04/23 0431 01/05/23 0430  WBC 12.6* 8.9 8.6 8.1  NEUTROABS 10.1* 6.0 5.7 4.6  HGB 13.8 13.6 13.1 13.6  HCT 41.8 41.6 38.7 41.8  MCV 88.9 90.4 87.6 90.7  PLT 248 276 276 263   Basic Metabolic Panel: Recent Labs  Lab 01/01/23 0112 01/02/23 0705 01/03/23 0455 01/04/23 0431 01/05/23 0430  NA 136 142 139 140 141  K 3.0* 3.4* 2.9* 3.6 3.6  CL 103 114* 108 108 109  CO2 23 22 23 24 25   GLUCOSE 127* 89 89 114* 104*  BUN 15 9 8  6* 10  CREATININE 1.02* 0.89 0.87 0.80 0.95  CALCIUM 8.5* 8.1* 8.3* 8.3* 8.8*  MG 2.1  --   --   --   --    Liver Function Tests: Recent Labs  Lab 01/01/23 0112  AST 74*  ALT 42  ALKPHOS 52  BILITOT 1.3*  PROT 6.5  ALBUMIN 3.5   CBG: Recent Labs  Lab 01/01/23 2221  GLUCAP  83    Discharge time spent:  35 minutes.  Signed: Loyce Dys, MD [facility] 01/05/2023

## 2023-01-05 NOTE — Progress Notes (Signed)
CSW confirmed with [facility] that they can take patient today. CSW was told that the nurse doesn't need to call report and all they need is any new prescriptions for patient and discharge summary.

## 2023-01-06 LAB — CULTURE, BLOOD (SINGLE)
Culture: NO GROWTH
Special Requests: ADEQUATE

## 2023-01-07 ENCOUNTER — Other Ambulatory Visit: Payer: Self-pay

## 2023-01-07 DIAGNOSIS — R32 Unspecified urinary incontinence: Secondary | ICD-10-CM

## 2023-01-07 DIAGNOSIS — N390 Urinary tract infection, site not specified: Secondary | ICD-10-CM | POA: Diagnosis not present

## 2023-01-07 DIAGNOSIS — R4182 Altered mental status, unspecified: Secondary | ICD-10-CM | POA: Diagnosis not present

## 2023-01-07 DIAGNOSIS — R2681 Unsteadiness on feet: Secondary | ICD-10-CM | POA: Diagnosis not present

## 2023-01-07 DIAGNOSIS — R296 Repeated falls: Secondary | ICD-10-CM | POA: Diagnosis not present

## 2023-01-07 DIAGNOSIS — M6282 Rhabdomyolysis: Secondary | ICD-10-CM | POA: Diagnosis not present

## 2023-01-07 DIAGNOSIS — G300 Alzheimer's disease with early onset: Secondary | ICD-10-CM | POA: Diagnosis not present

## 2023-01-07 DIAGNOSIS — F02B18 Dementia in other diseases classified elsewhere, moderate, with other behavioral disturbance: Secondary | ICD-10-CM | POA: Diagnosis not present

## 2023-01-07 MED ORDER — OXYBUTYNIN CHLORIDE ER 15 MG PO TB24
ORAL_TABLET | ORAL | 0 refills | Status: AC
Start: 2023-01-07 — End: ?

## 2023-01-08 DIAGNOSIS — M6281 Muscle weakness (generalized): Secondary | ICD-10-CM | POA: Diagnosis not present

## 2023-01-08 DIAGNOSIS — F02B18 Dementia in other diseases classified elsewhere, moderate, with other behavioral disturbance: Secondary | ICD-10-CM | POA: Diagnosis not present

## 2023-01-08 DIAGNOSIS — Z741 Need for assistance with personal care: Secondary | ICD-10-CM | POA: Diagnosis not present

## 2023-01-08 DIAGNOSIS — R296 Repeated falls: Secondary | ICD-10-CM | POA: Diagnosis not present

## 2023-01-08 DIAGNOSIS — Z9181 History of falling: Secondary | ICD-10-CM | POA: Diagnosis not present

## 2023-01-08 DIAGNOSIS — Z7389 Other problems related to life management difficulty: Secondary | ICD-10-CM | POA: Diagnosis not present

## 2023-01-08 DIAGNOSIS — M6282 Rhabdomyolysis: Secondary | ICD-10-CM | POA: Diagnosis not present

## 2023-01-08 DIAGNOSIS — N390 Urinary tract infection, site not specified: Secondary | ICD-10-CM | POA: Diagnosis not present

## 2023-01-10 DIAGNOSIS — M6259 Muscle wasting and atrophy, not elsewhere classified, multiple sites: Secondary | ICD-10-CM | POA: Diagnosis not present

## 2023-01-10 DIAGNOSIS — M6282 Rhabdomyolysis: Secondary | ICD-10-CM | POA: Diagnosis not present

## 2023-01-10 DIAGNOSIS — N390 Urinary tract infection, site not specified: Secondary | ICD-10-CM | POA: Diagnosis not present

## 2023-01-10 DIAGNOSIS — R279 Unspecified lack of coordination: Secondary | ICD-10-CM | POA: Diagnosis not present

## 2023-01-10 DIAGNOSIS — R4182 Altered mental status, unspecified: Secondary | ICD-10-CM | POA: Diagnosis not present

## 2023-01-10 DIAGNOSIS — R2689 Other abnormalities of gait and mobility: Secondary | ICD-10-CM | POA: Diagnosis not present

## 2023-01-13 ENCOUNTER — Emergency Department: Payer: Medicare HMO

## 2023-01-13 ENCOUNTER — Encounter: Payer: Self-pay | Admitting: Emergency Medicine

## 2023-01-13 ENCOUNTER — Other Ambulatory Visit: Payer: Self-pay

## 2023-01-13 ENCOUNTER — Inpatient Hospital Stay
Admission: EM | Admit: 2023-01-13 | Discharge: 2023-01-16 | DRG: 690 | Disposition: A | Discharge status: Skilled Nursing Facility | Payer: Medicare HMO | Source: Skilled Nursing Facility | Attending: Obstetrics and Gynecology | Admitting: Obstetrics and Gynecology

## 2023-01-13 DIAGNOSIS — R531 Weakness: Secondary | ICD-10-CM | POA: Diagnosis not present

## 2023-01-13 DIAGNOSIS — F0390 Unspecified dementia without behavioral disturbance: Secondary | ICD-10-CM | POA: Diagnosis present

## 2023-01-13 DIAGNOSIS — R296 Repeated falls: Secondary | ICD-10-CM | POA: Diagnosis present

## 2023-01-13 DIAGNOSIS — Z515 Encounter for palliative care: Secondary | ICD-10-CM

## 2023-01-13 DIAGNOSIS — Z6841 Body Mass Index (BMI) 40.0 and over, adult: Secondary | ICD-10-CM

## 2023-01-13 DIAGNOSIS — G9340 Encephalopathy, unspecified: Secondary | ICD-10-CM | POA: Diagnosis not present

## 2023-01-13 DIAGNOSIS — Z833 Family history of diabetes mellitus: Secondary | ICD-10-CM

## 2023-01-13 DIAGNOSIS — G4700 Insomnia, unspecified: Secondary | ICD-10-CM | POA: Diagnosis present

## 2023-01-13 DIAGNOSIS — F02C4 Dementia in other diseases classified elsewhere, severe, with anxiety: Secondary | ICD-10-CM | POA: Diagnosis present

## 2023-01-13 DIAGNOSIS — Z1152 Encounter for screening for COVID-19: Secondary | ICD-10-CM

## 2023-01-13 DIAGNOSIS — R32 Unspecified urinary incontinence: Secondary | ICD-10-CM | POA: Diagnosis present

## 2023-01-13 DIAGNOSIS — F5102 Adjustment insomnia: Secondary | ICD-10-CM | POA: Diagnosis present

## 2023-01-13 DIAGNOSIS — K219 Gastro-esophageal reflux disease without esophagitis: Secondary | ICD-10-CM | POA: Diagnosis present

## 2023-01-13 DIAGNOSIS — G309 Alzheimer's disease, unspecified: Secondary | ICD-10-CM | POA: Diagnosis present

## 2023-01-13 DIAGNOSIS — F05 Delirium due to known physiological condition: Secondary | ICD-10-CM | POA: Diagnosis present

## 2023-01-13 DIAGNOSIS — Z66 Do not resuscitate: Secondary | ICD-10-CM | POA: Diagnosis not present

## 2023-01-13 DIAGNOSIS — G9341 Metabolic encephalopathy: Principal | ICD-10-CM

## 2023-01-13 DIAGNOSIS — I700 Atherosclerosis of aorta: Secondary | ICD-10-CM | POA: Diagnosis not present

## 2023-01-13 DIAGNOSIS — F32A Depression, unspecified: Secondary | ICD-10-CM | POA: Diagnosis not present

## 2023-01-13 DIAGNOSIS — F02C18 Dementia in other diseases classified elsewhere, severe, with other behavioral disturbance: Secondary | ICD-10-CM | POA: Diagnosis not present

## 2023-01-13 DIAGNOSIS — N390 Urinary tract infection, site not specified: Secondary | ICD-10-CM | POA: Diagnosis not present

## 2023-01-13 DIAGNOSIS — Z79899 Other long term (current) drug therapy: Secondary | ICD-10-CM

## 2023-01-13 DIAGNOSIS — E785 Hyperlipidemia, unspecified: Secondary | ICD-10-CM | POA: Diagnosis present

## 2023-01-13 DIAGNOSIS — F39 Unspecified mood [affective] disorder: Secondary | ICD-10-CM | POA: Diagnosis present

## 2023-01-13 DIAGNOSIS — Z8739 Personal history of other diseases of the musculoskeletal system and connective tissue: Secondary | ICD-10-CM

## 2023-01-13 DIAGNOSIS — F02C3 Dementia in other diseases classified elsewhere, severe, with mood disturbance: Secondary | ICD-10-CM | POA: Diagnosis present

## 2023-01-13 DIAGNOSIS — Z7189 Other specified counseling: Secondary | ICD-10-CM | POA: Diagnosis not present

## 2023-01-13 DIAGNOSIS — Z888 Allergy status to other drugs, medicaments and biological substances status: Secondary | ICD-10-CM

## 2023-01-13 DIAGNOSIS — B952 Enterococcus as the cause of diseases classified elsewhere: Secondary | ICD-10-CM | POA: Diagnosis present

## 2023-01-13 DIAGNOSIS — E66813 Obesity, class 3: Secondary | ICD-10-CM | POA: Diagnosis present

## 2023-01-13 DIAGNOSIS — Z8249 Family history of ischemic heart disease and other diseases of the circulatory system: Secondary | ICD-10-CM | POA: Diagnosis not present

## 2023-01-13 DIAGNOSIS — R4182 Altered mental status, unspecified: Secondary | ICD-10-CM | POA: Diagnosis present

## 2023-01-13 DIAGNOSIS — F411 Generalized anxiety disorder: Secondary | ICD-10-CM | POA: Diagnosis present

## 2023-01-13 LAB — URINALYSIS, ROUTINE W REFLEX MICROSCOPIC
Bilirubin Urine: NEGATIVE
Glucose, UA: NEGATIVE mg/dL
Hgb urine dipstick: NEGATIVE
Ketones, ur: NEGATIVE mg/dL
Nitrite: NEGATIVE
Protein, ur: NEGATIVE mg/dL
Specific Gravity, Urine: 1.011 (ref 1.005–1.030)
WBC, UA: >50 WBC/hpf (ref 0–5)
pH: 6 (ref 5.0–8.0)

## 2023-01-13 LAB — COMPREHENSIVE METABOLIC PANEL
ALT: 28 U/L (ref 0–44)
AST: 25 U/L (ref 15–41)
Albumin: 3.4 g/dL — ABNORMAL LOW (ref 3.5–5.0)
Alkaline Phosphatase: 55 U/L (ref 38–126)
Anion gap: 10 (ref 5–15)
BUN: 11 mg/dL (ref 8–23)
CO2: 23 mmol/L (ref 22–32)
Calcium: 8.7 mg/dL — ABNORMAL LOW (ref 8.9–10.3)
Chloride: 105 mmol/L (ref 98–111)
Creatinine, Ser: 0.98 mg/dL (ref 0.44–1.00)
GFR, Estimated: 59 mL/min — ABNORMAL LOW (ref >60)
Glucose, Bld: 96 mg/dL (ref 70–99)
Potassium: 3.5 mmol/L (ref 3.5–5.1)
Sodium: 138 mmol/L (ref 135–145)
Total Bilirubin: 1 mg/dL (ref <1.2)
Total Protein: 6.4 g/dL — ABNORMAL LOW (ref 6.5–8.1)

## 2023-01-13 LAB — LACTIC ACID, PLASMA: Lactic Acid, Venous: 1.2 mmol/L (ref 0.5–1.9)

## 2023-01-13 LAB — CBC
HCT: 45.6 % (ref 36.0–46.0)
Hemoglobin: 14.3 g/dL (ref 12.0–15.0)
MCH: 29.3 pg (ref 26.0–34.0)
MCHC: 31.4 g/dL (ref 30.0–36.0)
MCV: 93.4 fL (ref 80.0–100.0)
Platelets: 233 10*3/uL (ref 150–400)
RBC: 4.88 MIL/uL (ref 3.87–5.11)
RDW: 12.9 % (ref 11.5–15.5)
WBC: 6.5 10*3/uL (ref 4.0–10.5)
nRBC: 0 % (ref 0.0–0.2)

## 2023-01-13 LAB — SARS CORONAVIRUS 2 BY RT PCR: SARS Coronavirus 2 by RT PCR: NEGATIVE

## 2023-01-13 LAB — LITHIUM LEVEL: Lithium Lvl: <0.06 mmol/L — ABNORMAL LOW (ref 0.60–1.20)

## 2023-01-13 LAB — CK: Total CK: 160 U/L (ref 38–234)

## 2023-01-13 MED ORDER — ONDANSETRON HCL 4 MG PO TABS: 4.0000 mg | ORAL_TABLET | Freq: Four times a day (QID) | ORAL | Status: DC | PRN

## 2023-01-13 MED ORDER — SODIUM CHLORIDE 0.9 % IV SOLN
2.0000 g | INTRAVENOUS | Status: DC
  Administered 2023-01-14: 2 g via INTRAVENOUS
  Filled 2023-01-13 (×2): qty 20

## 2023-01-13 MED ORDER — ENOXAPARIN SODIUM 60 MG/0.6ML IJ SOSY
0.5000 mg/kg | PREFILLED_SYRINGE | INTRAMUSCULAR | Status: DC
  Administered 2023-01-14 – 2023-01-15 (×2): 52.5 mg via SUBCUTANEOUS
  Filled 2023-01-13 (×2): qty 0.6

## 2023-01-13 MED ORDER — DONEPEZIL HCL 5 MG PO TABS
5.0000 mg | ORAL_TABLET | Freq: Every day | ORAL | Status: DC
  Administered 2023-01-14 – 2023-01-15 (×2): 5 mg via ORAL
  Filled 2023-01-13 (×2): qty 1

## 2023-01-13 MED ORDER — OXYBUTYNIN CHLORIDE ER 5 MG PO TB24
15.0000 mg | ORAL_TABLET | Freq: Every day | ORAL | Status: DC
  Administered 2023-01-14 – 2023-01-15 (×2): 15 mg via ORAL
  Filled 2023-01-13 (×4): qty 1

## 2023-01-13 MED ORDER — MEMANTINE HCL 5 MG PO TABS
10.0000 mg | ORAL_TABLET | Freq: Every day | ORAL | Status: DC
  Administered 2023-01-14 – 2023-01-15 (×2): 10 mg via ORAL
  Filled 2023-01-13 (×2): qty 2

## 2023-01-13 MED ORDER — HALOPERIDOL LACTATE 5 MG/ML IJ SOLN: 2.0000 mg | Freq: Four times a day (QID) | INTRAMUSCULAR | Status: AC | PRN

## 2023-01-13 MED ORDER — HALOPERIDOL LACTATE 5 MG/ML IJ SOLN
2.0000 mg | Freq: Four times a day (QID) | INTRAMUSCULAR | Status: AC | PRN
  Administered 2023-01-13: 2 mg via INTRAVENOUS
  Filled 2023-01-13: qty 1

## 2023-01-13 MED ORDER — TRAZODONE HCL 50 MG PO TABS
100.0000 mg | ORAL_TABLET | Freq: Every day | ORAL | Status: DC
  Administered 2023-01-14 – 2023-01-15 (×2): 100 mg via ORAL
  Filled 2023-01-13 (×2): qty 2

## 2023-01-13 MED ORDER — DULOXETINE HCL 20 MG PO CPEP
40.0000 mg | ORAL_CAPSULE | Freq: Every day | ORAL | Status: DC
Start: 2023-01-13 — End: 2023-01-16
  Administered 2023-01-14 – 2023-01-16 (×3): 40 mg via ORAL
  Filled 2023-01-13 (×3): qty 2

## 2023-01-13 MED ORDER — ENOXAPARIN SODIUM 40 MG/0.4ML IJ SOSY: 40.0000 mg | PREFILLED_SYRINGE | INTRAMUSCULAR | Status: DC

## 2023-01-13 MED ORDER — MIRTAZAPINE 15 MG PO TABS
15.0000 mg | ORAL_TABLET | Freq: Every day | ORAL | Status: DC
  Administered 2023-01-14 – 2023-01-15 (×2): 15 mg via ORAL
  Filled 2023-01-13 (×2): qty 1

## 2023-01-13 MED ORDER — ONDANSETRON HCL 4 MG/2ML IJ SOLN: 4.0000 mg | Freq: Four times a day (QID) | INTRAMUSCULAR | Status: DC | PRN

## 2023-01-13 MED ORDER — SODIUM CHLORIDE 0.9 % IV SOLN
2.0000 g | Freq: Once | INTRAVENOUS | Status: AC
  Administered 2023-01-13: 2 g via INTRAVENOUS
  Filled 2023-01-13: qty 20

## 2023-01-13 MED ORDER — IBUPROFEN 400 MG PO TABS: 200.0000 mg | ORAL_TABLET | Freq: Four times a day (QID) | ORAL | Status: AC | PRN

## 2023-01-13 MED ORDER — LITHIUM CARBONATE 150 MG PO CAPS
150.0000 mg | ORAL_CAPSULE | Freq: Every day | ORAL | Status: DC
Start: 2023-01-13 — End: 2023-01-16
  Administered 2023-01-14 – 2023-01-15 (×2): 150 mg via ORAL
  Filled 2023-01-13 (×4): qty 1

## 2023-01-13 NOTE — Assessment & Plan Note (Signed)
Home lithium resumed on admission Check lithium level

## 2023-01-13 NOTE — Assessment & Plan Note (Addendum)
Presumed secondary to UTI Check Covid PCR and sCK on admission Check lithium level on admission Portable chest x-ray was negative Continue with ceftriaxone 2 g IV daily to complete 5-day course Prior organisms included Enterobacter, Pseudomonas, Proteus During hospitalization from 01/01/23 to 12/7: Patient received Levaquin Patient has not been able to tolerate suppressive therapy due to history of C. difficile colitis Urine culture in process

## 2023-01-13 NOTE — ED Notes (Signed)
Houchin mitts placed on pt. Pt has hx of pulling on cords and IV's.

## 2023-01-13 NOTE — ED Provider Notes (Addendum)
[facility] Provider Note    Event Date/Time   First MD Initiated Contact with Patient 01/13/23 1122     (approximate)   History   Altered Mental Status   HPI  Lindsay Snyder is a 77 y.o. female with a history of dementia brought in for altered mental status from nursing home.  Patient is unable to provide significant history.  Review of records demonstrates the patient was discharged 8 days ago after treatment for metabolic encephalopathy secondary to a UTI      Physical Exam   Triage Vital Signs: ED Triage Vitals  Encounter Vitals Group     BP 01/13/23 0724 (!) 143/65     Systolic BP Percentile --      Diastolic BP Percentile --      Pulse Rate 01/13/23 0724 67     Resp 01/13/23 0724 14     Temp 01/13/23 0724 98 F (36.7 C)     Temp Source 01/13/23 0724 Oral     SpO2 01/13/23 0724 99 %     Weight --      Height --      Head Circumference --      Peak Flow --      Pain Score 01/13/23 1140 0     Pain Loc --      Pain Education --      Exclude from Growth Chart --     Most recent vital signs: Vitals:   01/13/23 1140 01/13/23 1255  BP: (!) 140/60   Pulse: 70   Resp: 16   Temp:  97.9 F (36.6 C)  SpO2: 99%      General: Awake, no distress.  CV:  Good peripheral perfusion.  Resp:  Normal effort.  Clear to auscultation bilaterally Abd:  No distention.  Soft, nontender Other:     ED Results / Procedures / Treatments   Labs (all labs ordered are listed, but only abnormal results are displayed) Labs Reviewed  COMPREHENSIVE METABOLIC PANEL - Abnormal; Notable for the following components:      Result Value   Calcium 8.7 (*)    Total Protein 6.4 (*)    Albumin 3.4 (*)    GFR, Estimated 59 (*)    All other components within normal limits  URINALYSIS, ROUTINE W REFLEX MICROSCOPIC - Abnormal; Notable for the following components:   Color, Urine YELLOW (*)    APPearance HAZY (*)    Leukocytes,Ua MODERATE (*)    Bacteria, UA  RARE (*)    All other components within normal limits  URINE CULTURE  CULTURE, BLOOD (ROUTINE X 2)  CULTURE, BLOOD (ROUTINE X 2)  CBC  LACTIC ACID, PLASMA  LACTIC ACID, PLASMA  CBG MONITORING, ED     EKG     RADIOLOGY Chest x-ray viewed interpret by me, no acute abnormality    PROCEDURES:  Critical Care performed:   Procedures   MEDICATIONS ORDERED IN ED: Medications  cefTRIAXone (ROCEPHIN) 2 g in sodium chloride 0.9 % 100 mL IVPB (has no administration in time range)     IMPRESSION / MDM / ASSESSMENT AND PLAN / ED COURSE  I reviewed the triage vital signs and the nursing notes. Patient's presentation is most consistent with acute presentation with potential threat to life or bodily function.  Patient presents with altered mental status as detailed above, this is in the setting of dementia.  Similar presentation last week, question whether recurrence of urinary tract infection or other  infection, chest x-ray looks reassuring, lab work overall unremarkable.  Pending urinalysis  Urinalysis is consistent with urinary tract infection, will add cultures, lactate, treat with IV Rocephin, will likely require admission for metabolic encephalopathy      FINAL CLINICAL IMPRESSION(S) / ED DIAGNOSES   Final diagnoses:  Metabolic encephalopathy  Lower urinary tract infectious disease     Rx / DC Orders   ED Discharge Orders     None        Note:  This document was prepared using Dragon voice recognition software and may include unintentional dictation errors.   Jene Every, MD 01/13/23 1339    Jene Every, MD 01/13/23 1535

## 2023-01-13 NOTE — ED Notes (Signed)
Pt attempted to get out of bed at this time.

## 2023-01-13 NOTE — ED Notes (Signed)
!  st set of cultures obtained and lactic obtained at this time. Pt is a heard stick. Iv team consult placed. Pt hooked up to monitor. Fall bracelet present. Grippy socks present. Bed alarm activated. Warm blankets given.

## 2023-01-13 NOTE — ED Notes (Signed)
See triage note  Presents from [facility]  Per EMS she has been more confused  Not sure where she is or answering questioning appropriately  But will follow some commands

## 2023-01-13 NOTE — ED Triage Notes (Signed)
Awake, alert.  Confused to situation, time and place.  Not answering questions appropriately.

## 2023-01-13 NOTE — H&P (Addendum)
Addendum: Received message from nursing staff that patient is confused, trying to get out of bed and not oriented.  Patient was not oriented with me at bedside on my evaluation.  Patient was only able to tell me her first name.  She did not know her age, year, location.  She did not say the full name of her daughter only said [place].  Altered mental status with agitation, attempting to get out of bed-presenting as a danger to herself -Okay for nursing to place mittens on patient's hands -Haldol 2 mg IV/IM every 6 hours.  For agitation, 15 hours of coverage ordered -If the Haldol does not work, we may need a one-to-one Comptroller  Dr. Sedalia Muta   History and Physical   Lindsay Snyder ZOX:[id number] DOB: 06-02-1945 DOA: 01/13/2023  PCP: Doreene Nest, NP  Outpatient Specialists: Dr. [facility] Patient coming from: [facility] via EMS  I have personally briefly reviewed patient's old medical records in [facility] EMR.  Chief Concern: Altered mental status  HPI: Ms. Lindsay Snyder is a 77 year old female with history of Alzheimer dementia, depression, anxiety, history of rhabdomyolysis, who presents emergency department for chief concerns of altered mental status.  Patient is from [facility].  Vitals in the ED showed temperature of 97.9, respiration rate for 16, heart rate of 70, blood pressure 140/60, SpO2 of 99% on room air.  Serum sodium is 138, potassium 3.5, chloride 105, bicarb 23, BUN of 11, serum creatinine of 0.98, EGFR 59, nonfasting blood glucose 96, WBC 6.5, hemoglobin 14.3, platelets of 233.  UA was positive for moderate leukocytes.  ED treatment: Ceftriaxone 2 g IV one-time dose. -------------------------- At bedside, patient was able to tell me that she is Lindsay Snyder.  She was not able to tell me her age, current calendar year, or current location.  She was able to identify her daughter at bedside as Lindsay Snyder.   Social history: Patient currently lives at  [place] [facility].  ROS: To complete as patient has altered mental status and has moderate to advanced dementia  ED Course: Discussed with EDP, patient requiring hospitalization for chief concerns of altered mental status presumed secondary to UTI.  Assessment/Plan  Principal Problem:   Altered mental status Active Problems:   Insomnia due to stress   Hyperlipidemia   GAD (generalized anxiety disorder)   Gastroesophageal reflux disease   Recurrent falls   Dementia (HCC)   Mood disorder (HCC)   UTI (urinary tract infection)   History of rhabdomyolysis   Assessment and Plan:  * Altered mental status Presumed secondary to UTI Check Covid PCR and sCK on admission Check lithium level on admission Portable chest x-ray was negative Continue with ceftriaxone 2 g IV daily to complete 5-day course Prior organisms included Enterobacter, Pseudomonas, Proteus During hospitalization from 01/01/23 to 12/7: Patient received Levaquin Patient has not been able to tolerate suppressive therapy due to history of C. difficile colitis Urine culture in process  History of rhabdomyolysis Check CK on admission  Mood disorder (HCC) Home lithium resumed on admission Check lithium level  Dementia (HCC) Resumed home donepezil 5 mg nightly, memantine 10 mg daily  Recurrent falls Fall precautions  GAD (generalized anxiety disorder) Home trazodone 100 mg daily, mirtazapine 15 mg daily, duloxetine 40 mg daily were resumed on admission  Insomnia due to stress Trazodone 100 mg nightly resumed  Chart reviewed.   DVT prophylaxis: Enoxaparin Code Status: Full code Diet: Regular diet Family Communication: Spoke with daughter Lindsay Snyder at bedside  Disposition Plan: Pending clinical course Consults called: TOC Admission status: Telemetry medical, inpatient  Past Medical History:  Diagnosis Date   Allergy 010150   Anxiety 08/30/2019   Appendicitis 8    C. difficile diarrhea 02/04/2022    Cancer [facility])    daughter states patient never had cancer   Dementia (HCC)    GERD (gastroesophageal reflux disease) 01/30/2019   Otitis externa 10/14/2020   Polyp of colon    Benign   Past Surgical History:  Procedure Laterality Date   ABDOMINAL HYSTERECTOMY     APPENDECTOMY     BREAST EXCISIONAL BIOPSY Right    + 20 years neg   COLONOSCOPY WITH PROPOFOL N/A 12/16/2017   Procedure: COLONOSCOPY WITH PROPOFOL;  Surgeon: Wyline Mood, MD;  Location: [facility] ENDOSCOPY;  Service: Gastroenterology;  Laterality: N/A;   COLONOSCOPY WITH PROPOFOL N/A 01/02/2021   Procedure: COLONOSCOPY WITH PROPOFOL;  Surgeon: Wyline Mood, MD;  Location: [facility] ENDOSCOPY;  Service: Gastroenterology;  Laterality: N/A;   Social History:  reports that she has never smoked. She has never used smokeless tobacco. She reports that she does not drink alcohol and does not use drugs.  Allergies  Allergen Reactions   Crestor [Rosuvastatin Calcium] Rash and Other (See Comments)    Myalgia   Tylenol [Acetaminophen] Rash   Family History  Problem Relation Age of Onset   Diabetes Mother        Deceased   Heart attack Father 40       Deceased   Cancer Father        Colon   Breast cancer Paternal Aunt    Family history: Family history reviewed and not pertinent  Prior to Admission medications   Medication Sig Start Date End Date Taking? Authorizing Provider  cetirizine (ZYRTEC) 10 MG tablet Take 10 mg by mouth daily.    [provider]  diclofenac Sodium (VOLTAREN) 1 % GEL Apply 4 g topically 3 (three) times daily as needed (pain). (Apply to affected area of legs) 12/24/22   [provider]  donepezil (ARICEPT) 5 MG tablet Take 5 mg by mouth at bedtime. 09/29/20   [provider]  DULoxetine (CYMBALTA) 20 MG capsule TAKE 2 CAPSULES(40 MG) BY MOUTH DAILY FOR ANXIETY 08/26/22   Doreene Nest, NP  lithium carbonate 150 MG capsule Take 150 mg by mouth daily. 12/01/21   [provider]   memantine (NAMENDA) 5 MG tablet Take 2 tablets (10 mg total) by mouth daily. For memory 11/29/22   Doreene Nest, NP  mirtazapine (REMERON) 15 MG tablet Take 15 mg by mouth at bedtime.    [provider]  ondansetron (ZOFRAN-ODT) 4 MG disintegrating tablet Take 1 tablet (4 mg total) by mouth every 8 (eight) hours as needed for nausea or vomiting. 02/09/22   Doreene Nest, NP  oxybutynin (DITROPAN XL) 15 MG 24 hr tablet TAKE 1 TABLET(15 MG) BY MOUTH AT BEDTIME FOR OVERACTIVE BLADDER 01/07/23   Doreene Nest, NP  traZODone (DESYREL) 100 MG tablet Take 100 mg by mouth at bedtime. 12/01/21   [provider]   Physical Exam: Vitals:   01/13/23 1140 01/13/23 1255 01/13/23 1309 01/13/23 1551  BP: (!) 140/60   107/84  Pulse: 70   76  Resp: 16   (!) 21  Temp:  97.9 F (36.6 C)  98.4 F (36.9 C)  TempSrc:  Oral  Oral  SpO2: 99%   100%  Weight:   106.7 kg   Height:  5\' 2"  (1.575 m)    Constitutional: appears frail, pleasantly confused, NAD, calm Eyes: PERRL, lids and conjunctivae normal ENMT: Mucous membranes are moist. Posterior pharynx clear of any exudate or lesions. Age-appropriate dentition. Hearing appropriate Neck: normal, supple, no masses, no thyromegaly Respiratory: clear to auscultation bilaterally, no wheezing, no crackles. Normal respiratory effort. No accessory muscle use.  Cardiovascular: Regular rate and rhythm, no murmurs / rubs / gallops. No extremity edema. 2+ pedal pulses. No carotid bruits.  Abdomen: Obese abdomen, no tenderness, no masses palpated, no hepatosplenomegaly. Bowel sounds positive.  Musculoskeletal: no clubbing / cyanosis. No joint deformity upper and lower extremities. Good ROM, no contractures, no atrophy. Normal muscle tone.  Skin: no rashes, lesions, ulcers. No induration Neurologic: Sensation intact. Strength 5/5 in all 4.  Psychiatric: Unable to assess judgment, insight, alertness, orientation, mood.  EKG: Not indicated at  this time  Chest x-ray on Admission: I personally reviewed and I agree with radiologist reading as below.  CT Head Wo Contrast Result Date: 01/13/2023 CLINICAL DATA:  Mental status change, unknown cause. New onset confusion. EXAM: CT HEAD WITHOUT CONTRAST TECHNIQUE: Contiguous axial images were obtained from the base of the skull through the vertex without intravenous contrast. RADIATION DOSE REDUCTION: This exam was performed according to the departmental dose-optimization program which includes automated exposure control, adjustment of the mA and/or kV according to patient size and/or use of iterative reconstruction technique. COMPARISON:  MR head without contrast 01/02/2023. CT head without contrast 01/01/2023. FINDINGS: Brain: No acute infarct, hemorrhage, or mass lesion is present. Mild atrophy and white matter changes are stable. Deep brain nuclei are within normal limits. No acute or focal cortical abnormalities are present. No significant extraaxial fluid collection is present. The brainstem and cerebellum are within normal limits. Midline structures are within normal limits. Vascular: No hyperdense vessel or unexpected calcification. Skull: Calvarium is intact. No focal lytic or blastic lesions are present. No significant extracranial soft tissue lesion is present. Sinuses/Orbits: A right mastoid effusion is present. The paranasal sinuses and mastoid air cells are otherwise clear. The globes and orbits are within normal limits. IMPRESSION: 1. No acute intracranial abnormality or significant interval change. 2. Stable mild atrophy and white matter disease. This likely reflects the sequela of chronic microvascular ischemia. 3. Right mastoid effusion. Electronically Signed   By: Marin Roberts M.D.   On: 01/13/2023 12:19   DG Chest Port 1 View Result Date: 01/13/2023 CLINICAL DATA:  Weakness.  Altered mental status. EXAM: PORTABLE CHEST 1 VIEW COMPARISON:  AP chest 01/01/2023 FINDINGS: Cardiac  silhouette is again at the upper limits of normal size. Mediastinal contours are within normal limits. Mild-to-moderate atherosclerotic calcifications within the aortic arch. Unchanged mild to moderate chronic bilateral interstitial thickening compared to multiple prior radiographs. No acute airspace opacity. No pleural effusion pneumothorax. Mild-to-moderate multilevel degenerative disc changes of the thoracic spine with minimal dextrocurvature of the midthoracic spine. Severe left glenohumeral osteoarthritis with likely old healed fracture of the surgical neck, unchanged. IMPRESSION: Unchanged mild to moderate chronic bilateral interstitial thickening. No acute airspace opacity. Electronically Signed   By: Neita Garnet M.D.   On: 01/13/2023 11:57   Labs on Admission: I have personally reviewed following labs  CBC: Recent Labs  Lab 01/13/23 0726  WBC 6.5  HGB 14.3  HCT 45.6  MCV 93.4  PLT 233   Basic Metabolic Panel: Recent Labs  Lab 01/13/23 0726  NA 138  K 3.5  CL 105  CO2 23  GLUCOSE 96  BUN 11  CREATININE 0.98  CALCIUM 8.7*   GFR: Estimated Creatinine Clearance: 54.3 mL/min (by C-G formula based on SCr of 0.98 mg/dL).  Liver Function Tests: Recent Labs  Lab 01/13/23 0726  AST 25  ALT 28  ALKPHOS 55  BILITOT 1.0  PROT 6.4*  ALBUMIN 3.4*   BNP (last 3 results) Recent Labs    03/01/22 1006  PROBNP 36.0   Urine analysis:    Component Value Date/Time   COLORURINE YELLOW (A) 01/13/2023 1254   APPEARANCEUR HAZY (A) 01/13/2023 1254   LABSPEC 1.011 01/13/2023 1254   PHURINE 6.0 01/13/2023 1254   GLUCOSEU NEGATIVE 01/13/2023 1254   GLUCOSEU NEGATIVE 09/25/2022 1310   HGBUR NEGATIVE 01/13/2023 1254   BILIRUBINUR NEGATIVE 01/13/2023 1254   BILIRUBINUR neg 09/06/2022 1209   KETONESUR NEGATIVE 01/13/2023 1254   PROTEINUR NEGATIVE 01/13/2023 1254   UROBILINOGEN 0.2 09/25/2022 1310   NITRITE NEGATIVE 01/13/2023 1254   LEUKOCYTESUR MODERATE (A) 01/13/2023 1254    This document was prepared using Dragon Voice Recognition software and may include unintentional dictation errors.  Dr. Sedalia Muta [facility]  If 7PM-7AM, please contact overnight-coverage provider If 7AM-7PM, please contact day attending provider [email or web address]  01/13/2023, 6:03 PM

## 2023-01-13 NOTE — Assessment & Plan Note (Signed)
Resumed home donepezil 5 mg nightly, memantine 10 mg daily

## 2023-01-13 NOTE — Progress Notes (Signed)
PHARMACIST - PHYSICIAN COMMUNICATION  CONCERNING:  Enoxaparin (Lovenox) for DVT Prophylaxis    RECOMMENDATION: Patient was prescribed enoxaprin 40mg  q24 hours for VTE prophylaxis.   Filed Weights   01/13/23 1309  Weight: 106.7 kg (235 lb 3.7 oz)    Body mass index is 43.02 kg/m.  Estimated Creatinine Clearance: 54.3 mL/min (by C-G formula based on SCr of 0.98 mg/dL).   Based on [facility] policy patient is candidate for enoxaparin 0.5mg /kg TBW SQ every 24 hours based on BMI being >30.  DESCRIPTION: Pharmacy has adjusted enoxaparin dose per [facility] policy.  Patient is now receiving enoxaparin 52.5 mg every 24 hours    Foye Deer, PharmD Clinical Pharmacist  01/13/2023 3:40 PM

## 2023-01-13 NOTE — ED Notes (Signed)
Pt calling out at this time. Pt stating need for bathroom. Pt able to use bedpan.

## 2023-01-13 NOTE — Hospital Course (Addendum)
Ms. Lindsay Snyder is a 77 year old female with history of Alzheimer dementia, depression, anxiety, history of rhabdomyolysis, who presents emergency department for chief concerns of altered mental status.  Patient is from [facility].  Vitals in the ED showed temperature of 97.9, respiration rate for 16, heart rate of 70, blood pressure 140/60, SpO2 of 99% on room air.  Serum sodium is 138, potassium 3.5, chloride 105, bicarb 23, BUN of 11, serum creatinine of 0.98, EGFR 59, nonfasting blood glucose 96, WBC 6.5, hemoglobin 14.3, platelets of 233.  UA was positive for moderate leukocytes.  ED treatment: Ceftriaxone 2 g IV one-time dose.

## 2023-01-13 NOTE — ED Notes (Addendum)
Pt attempting to get up and out of bed at this time. Pt sitting at the end of bed attempting to pull off monitoring cords. Mitts pulled off and laying on the side of bed. Pt easily redirected.

## 2023-01-13 NOTE — ED Notes (Addendum)
Daughter would like to be called if pt gets a rm.

## 2023-01-13 NOTE — Assessment & Plan Note (Signed)
-   Trazodone 100 mg nightly resumed

## 2023-01-13 NOTE — ED Notes (Signed)
Iv team at bedside  

## 2023-01-13 NOTE — ED Triage Notes (Signed)
Pt in via EMS from [facility] with c/o AMS. Pt denies pain. 150/68, CBG 94, HR 65, 97% RA.

## 2023-01-13 NOTE — ED Notes (Signed)
O2 2/liter via [place] was applied to pt after patient's oxygen saturation reading in the low 80's while sleeping.

## 2023-01-13 NOTE — Assessment & Plan Note (Addendum)
Home trazodone 100 mg daily, mirtazapine 15 mg daily, duloxetine 40 mg daily were resumed on admission

## 2023-01-13 NOTE — Assessment & Plan Note (Signed)
Fall precautions.  

## 2023-01-13 NOTE — ED Notes (Signed)
IV access attempted several times.

## 2023-01-13 NOTE — Assessment & Plan Note (Signed)
Check CK on admission

## 2023-01-13 NOTE — ED Notes (Signed)
Pt attempting to get out of bed again. MD made aware.

## 2023-01-13 NOTE — ED Notes (Addendum)
Pt mitts & pulse ox pulled off. Mitts & pulse ox placed back on.

## 2023-01-14 DIAGNOSIS — R4182 Altered mental status, unspecified: Secondary | ICD-10-CM | POA: Diagnosis not present

## 2023-01-14 LAB — CBC
HCT: 40.8 % (ref 36.0–46.0)
Hemoglobin: 13.4 g/dL (ref 12.0–15.0)
MCH: 29.6 pg (ref 26.0–34.0)
MCHC: 32.8 g/dL (ref 30.0–36.0)
MCV: 90.1 fL (ref 80.0–100.0)
Platelets: 259 10*3/uL (ref 150–400)
RBC: 4.53 MIL/uL (ref 3.87–5.11)
RDW: 13.1 % (ref 11.5–15.5)
WBC: 6.4 10*3/uL (ref 4.0–10.5)
nRBC: 0 % (ref 0.0–0.2)

## 2023-01-14 LAB — BASIC METABOLIC PANEL
Anion gap: 9 (ref 5–15)
BUN: 7 mg/dL — ABNORMAL LOW (ref 8–23)
CO2: 23 mmol/L (ref 22–32)
Calcium: 8.4 mg/dL — ABNORMAL LOW (ref 8.9–10.3)
Chloride: 108 mmol/L (ref 98–111)
Creatinine, Ser: 0.89 mg/dL (ref 0.44–1.00)
GFR, Estimated: >60 mL/min (ref >60)
Glucose, Bld: 100 mg/dL — ABNORMAL HIGH (ref 70–99)
Potassium: 3.5 mmol/L (ref 3.5–5.1)
Sodium: 140 mmol/L (ref 135–145)

## 2023-01-14 NOTE — ED Notes (Signed)
Resting slid to edge of bed  and was incont of urine  Pt cleaned up and had new sheets and brief placed

## 2023-01-14 NOTE — Progress Notes (Signed)
PROGRESS NOTE    Lindsay Snyder  WUJ:[id number] DOB: 25-Aug-1945 DOA: 01/13/2023 PCP: Doreene Nest, NP     Brief Narrative:   From admission h and p Lindsay Snyder is a 77 year old female with history of Alzheimer dementia, depression, anxiety, history of rhabdomyolysis, who presents emergency department for chief concerns of altered mental status.   Patient is from [facility].    Assessment & Plan:   Principal Problem:   Altered mental status Active Problems:   Insomnia due to stress   Hyperlipidemia   GAD (generalized anxiety disorder)   Gastroesophageal reflux disease   Recurrent falls   Dementia (HCC)   Mood disorder (HCC)   UTI (urinary tract infection)   History of rhabdomyolysis  # Acute encephalopathy vs delirium # Dementia Patient with known dementia. Recently hospitalized and discharged about a week ago, treated for uti though no urine culture that hospitalization. Has a history of growing pseudomonas and enterococcus earlier this year. No fever. For what it's worth she denies to me abdominal/suprapubic pain and dysuria. She is incontinent to urine but unclear whether that's acute or chronic. Family does think antibiotics help when she gets like this and do think her mental status has changed overnight - after discussion w/ family will continue abx for now, f/u culture results. Has grown pseudomonas and enterobacter in the past but as she appears to have responded to ceftriaxone think reasonable to maintain on this for now  # Mood disorder Lithium level low - continue lithium, mirtazapine, cymbalta  # Dementia - home donepezil and memantine  # Goals-of-care Homero Fellers discussion today with family about patient's advanced dementia, quality of life (family admits it's quite poor), prognosis, etc. We discussed code status, what interventions she would or would not want, the concept of comfort care, hospice services, etc. They were receptive to this  information but want to think things over and discuss w/ family members - full scope for the time being (no blood products as is Jehova's witness) - palliative consult     DVT prophylaxis: lovenox Code Status: full for now Family Communication: daughter and son updated @ bedside 12/16  Level of care: Telemetry Medical Status is: Inpatient Remains inpatient appropriate because: need for IV abx    Consultants:  none  Procedures: none  Antimicrobials:  ceftriaxone    Subjective: No pain  Objective: Vitals:   01/13/23 2000 01/13/23 2100 01/14/23 0142 01/14/23 0718  BP: (!) 160/64 (!) 124/57 133/76 (!) 145/63  Pulse: 70 74 65 66  Resp: 14 15 11 11   Temp:   98.1 F (36.7 C) 97.9 F (36.6 C)  TempSrc:   Oral Oral  SpO2: 99% 98% 96% 99%  Weight:      Height:        Intake/Output Summary (Last 24 hours) at 01/14/2023 1020 Last data filed at 01/13/2023 1530 Gross per 24 hour  Intake --  Output 250 ml  Net -250 ml   Filed Weights   01/13/23 1309  Weight: 106.7 kg    Examination:  General exam: Appears calm and comfortable  Respiratory system: Clear to auscultation. Respiratory effort normal. Cardiovascular system: S1 & S2 heard, RRR.   Gastrointestinal system: Abdomen is nondistended, obese and soft Central nervous system: Alert and oriented to self. No focal deficits Extremities: Symmetric 5 x 5 power. Skin: bruises on arms. LE edema to calves Psychiatry: confused, calm    Data Reviewed: I have personally reviewed following labs and imaging studies  CBC:  Recent Labs  Lab 01/13/23 0726  WBC 6.5  HGB 14.3  HCT 45.6  MCV 93.4  PLT 233   Basic Metabolic Panel: Recent Labs  Lab 01/13/23 0726  NA 138  K 3.5  CL 105  CO2 23  GLUCOSE 96  BUN 11  CREATININE 0.98  CALCIUM 8.7*   GFR: Estimated Creatinine Clearance: 54.3 mL/min (by C-G formula based on SCr of 0.98 mg/dL). Liver Function Tests: Recent Labs  Lab 01/13/23 0726  AST 25  ALT  28  ALKPHOS 55  BILITOT 1.0  PROT 6.4*  ALBUMIN 3.4*   No results for input(s): "LIPASE", "AMYLASE" in the last 168 hours. No results for input(s): "AMMONIA" in the last 168 hours. Coagulation Profile: No results for input(s): "INR", "PROTIME" in the last 168 hours. Cardiac Enzymes: Recent Labs  Lab 01/13/23 0726  CKTOTAL 160   BNP (last 3 results) Recent Labs    03/01/22 1006  PROBNP 36.0   HbA1C: No results for input(s): "HGBA1C" in the last 72 hours. CBG: No results for input(s): "GLUCAP" in the last 168 hours. Lipid Profile: No results for input(s): "CHOL", "HDL", "LDLCALC", "TRIG", "CHOLHDL", "LDLDIRECT" in the last 72 hours. Thyroid Function Tests: No results for input(s): "TSH", "T4TOTAL", "FREET4", "T3FREE", "THYROIDAB" in the last 72 hours. Anemia Panel: No results for input(s): "VITAMINB12", "FOLATE", "FERRITIN", "TIBC", "IRON", "RETICCTPCT" in the last 72 hours. Urine analysis:    Component Value Date/Time   COLORURINE YELLOW (A) 01/13/2023 1254   APPEARANCEUR HAZY (A) 01/13/2023 1254   LABSPEC 1.011 01/13/2023 1254   PHURINE 6.0 01/13/2023 1254   GLUCOSEU NEGATIVE 01/13/2023 1254   GLUCOSEU NEGATIVE 09/25/2022 1310   HGBUR NEGATIVE 01/13/2023 1254   BILIRUBINUR NEGATIVE 01/13/2023 1254   BILIRUBINUR neg 09/06/2022 1209   KETONESUR NEGATIVE 01/13/2023 1254   PROTEINUR NEGATIVE 01/13/2023 1254   UROBILINOGEN 0.2 09/25/2022 1310   NITRITE NEGATIVE 01/13/2023 1254   LEUKOCYTESUR MODERATE (A) 01/13/2023 1254   Sepsis Labs: @LABRCNTIP (procalcitonin:4,lacticidven:4)  ) Recent Results (from the past 240 hours)  Blood culture (routine x 2)     Status: None (Preliminary result)   Collection Time: 01/13/23  3:41 PM   Specimen: BLOOD  Result Value Ref Range Status   Specimen Description BLOOD RIGHT ANTECUBITAL  Final   Special Requests   Final    BOTTLES DRAWN AEROBIC AND ANAEROBIC Blood Culture results may not be optimal due to an inadequate volume of  blood received in culture bottles   Culture   Final    NO GROWTH < 24 HOURS Performed at [facility], [address], [place], [place] [address]    Report Status PENDING  Incomplete  SARS Coronavirus 2 by RT PCR (hospital order, performed in [facility] hospital lab) *cepheid single result test* Anterior Nasal Swab     Status: None   Collection Time: 01/13/23  4:29 PM   Specimen: Anterior Nasal Swab  Result Value Ref Range Status   SARS Coronavirus 2 by RT PCR NEGATIVE NEGATIVE Final    Comment: (NOTE) SARS-CoV-2 target nucleic acids are NOT DETECTED.  The SARS-CoV-2 RNA is generally detectable in upper and lower respiratory specimens during the acute phase of infection. The lowest concentration of SARS-CoV-2 viral copies this assay can detect is 250 copies / mL. A negative result does not preclude SARS-CoV-2 infection and should not be used as the sole basis for treatment or other patient management decisions.  A negative result may occur with improper specimen collection / handling, submission  of specimen other than nasopharyngeal swab, presence of viral mutation(s) within the areas targeted by this assay, and inadequate number of viral copies (<250 copies / mL). A negative result must be combined with clinical observations, patient history, and epidemiological information.  Fact Sheet for Patients:   RoadLapTop.co.za  Fact Sheet for Healthcare Providers: http://kim-miller.com/  This test is not yet approved or  cleared by the [place] FDA and has been authorized for detection and/or diagnosis of SARS-CoV-2 by FDA under an Emergency Use Authorization (EUA).  This EUA will remain in effect (meaning this test can be used) for the duration of the COVID-19 declaration under Section 564(b)(1) of the Act, 21 U.S.C. section 360bbb-3(b)(1), unless the authorization is terminated or revoked sooner.  Performed at [facility], [address] [place], [place] [address]   Culture, blood (Routine X 2) w Reflex to ID Panel     Status: None (Preliminary result)   Collection Time: 01/13/23  5:43 PM   Specimen: BLOOD  Result Value Ref Range Status   Specimen Description BLOOD BLOOD RIGHT FOREARM  Final   Special Requests   Final    BOTTLES DRAWN AEROBIC AND ANAEROBIC Blood Culture results may not be optimal due to an inadequate volume of blood received in culture bottles   Culture   Final    NO GROWTH < 24 HOURS Performed at [facility], [address] [place], [place] [address]    Report Status PENDING  Incomplete         Radiology Studies: CT Head Wo Contrast Result Date: 01/13/2023 CLINICAL DATA:  Mental status change, unknown cause. New onset confusion. EXAM: CT HEAD WITHOUT CONTRAST TECHNIQUE: Contiguous axial images were obtained from the base of the skull through the vertex without intravenous contrast. RADIATION DOSE REDUCTION: This exam was performed according to the departmental dose-optimization program which includes automated exposure control, adjustment of the mA and/or kV according to patient size and/or use of iterative reconstruction technique. COMPARISON:  MR head without contrast 01/02/2023. CT head without contrast 01/01/2023. FINDINGS: Brain: No acute infarct, hemorrhage, or mass lesion is present. Mild atrophy and white matter changes are stable. Deep brain nuclei are within normal limits. No acute or focal cortical abnormalities are present. No significant extraaxial fluid collection is present. The brainstem and cerebellum are within normal limits. Midline structures are within normal limits. Vascular: No hyperdense vessel or unexpected calcification. Skull: Calvarium is intact. No focal lytic or blastic lesions are present. No significant extracranial soft tissue lesion is present. Sinuses/Orbits: A right mastoid effusion is present. The paranasal sinuses and mastoid  air cells are otherwise clear. The globes and orbits are within normal limits. IMPRESSION: 1. No acute intracranial abnormality or significant interval change. 2. Stable mild atrophy and white matter disease. This likely reflects the sequela of chronic microvascular ischemia. 3. Right mastoid effusion. Electronically Signed   By: Marin Roberts M.D.   On: 01/13/2023 12:19   DG Chest Port 1 View Result Date: 01/13/2023 CLINICAL DATA:  Weakness.  Altered mental status. EXAM: PORTABLE CHEST 1 VIEW COMPARISON:  AP chest 01/01/2023 FINDINGS: Cardiac silhouette is again at the upper limits of normal size. Mediastinal contours are within normal limits. Mild-to-moderate atherosclerotic calcifications within the aortic arch. Unchanged mild to moderate chronic bilateral interstitial thickening compared to multiple prior radiographs. No acute airspace opacity. No pleural effusion pneumothorax. Mild-to-moderate multilevel degenerative disc changes of the thoracic spine with minimal dextrocurvature of the midthoracic spine. Severe left glenohumeral osteoarthritis  with likely old healed fracture of the surgical neck, unchanged. IMPRESSION: Unchanged mild to moderate chronic bilateral interstitial thickening. No acute airspace opacity. Electronically Signed   By: Neita Garnet M.D.   On: 01/13/2023 11:57        Scheduled Meds:  donepezil  5 mg Oral QHS   DULoxetine  40 mg Oral Daily   enoxaparin (LOVENOX) injection  0.5 mg/kg Subcutaneous Q24H   lithium carbonate  150 mg Oral Daily   memantine  10 mg Oral Daily   mirtazapine  15 mg Oral QHS   oxybutynin  15 mg Oral QHS   traZODone  100 mg Oral QHS   Continuous Infusions:  cefTRIAXone (ROCEPHIN)  IV       LOS: 1 day     Silvano Bilis, MD [facility]   If 7PM-7AM, please contact night-coverage [email or web address] Password [email or web address] 01/14/2023, 10:20 AM

## 2023-01-14 NOTE — ED Notes (Signed)
This tech and trainee, Alexia, did a full linen and gown change on pt because she was soaked. Pt turned pretty good. Breakfast tray was offered, she denied. Pt given new blanket, no other needs expressed.

## 2023-01-14 NOTE — ED Notes (Signed)
Hospitalist at bedside to speak w/ family.

## 2023-01-14 NOTE — ED Notes (Signed)
Family and admitting MD at bedside

## 2023-01-15 DIAGNOSIS — Z7189 Other specified counseling: Secondary | ICD-10-CM | POA: Diagnosis not present

## 2023-01-15 DIAGNOSIS — R4182 Altered mental status, unspecified: Secondary | ICD-10-CM | POA: Diagnosis not present

## 2023-01-15 LAB — BASIC METABOLIC PANEL
Anion gap: 7 (ref 5–15)
BUN: 7 mg/dL — ABNORMAL LOW (ref 8–23)
CO2: 27 mmol/L (ref 22–32)
Calcium: 8.4 mg/dL — ABNORMAL LOW (ref 8.9–10.3)
Chloride: 105 mmol/L (ref 98–111)
Creatinine, Ser: 0.83 mg/dL (ref 0.44–1.00)
GFR, Estimated: >60 mL/min (ref >60)
Glucose, Bld: 88 mg/dL (ref 70–99)
Potassium: 3.1 mmol/L — ABNORMAL LOW (ref 3.5–5.1)
Sodium: 139 mmol/L (ref 135–145)

## 2023-01-15 LAB — CBC
HCT: 41.5 % (ref 36.0–46.0)
Hemoglobin: 13.5 g/dL (ref 12.0–15.0)
MCH: 29.5 pg (ref 26.0–34.0)
MCHC: 32.5 g/dL (ref 30.0–36.0)
MCV: 90.8 fL (ref 80.0–100.0)
Platelets: 271 10*3/uL (ref 150–400)
RBC: 4.57 MIL/uL (ref 3.87–5.11)
RDW: 13 % (ref 11.5–15.5)
WBC: 6.4 10*3/uL (ref 4.0–10.5)
nRBC: 0 % (ref 0.0–0.2)

## 2023-01-15 LAB — URINE CULTURE: Culture: 80000 — AB

## 2023-01-15 MED ORDER — POTASSIUM CHLORIDE CRYS ER 20 MEQ PO TBCR
60.0000 meq | EXTENDED_RELEASE_TABLET | Freq: Once | ORAL | Status: AC
  Administered 2023-01-15: 60 meq via ORAL
  Filled 2023-01-15: qty 3

## 2023-01-15 MED ORDER — ENSURE ENLIVE PO LIQD
237.0000 mL | Freq: Two times a day (BID) | ORAL | Status: DC
  Administered 2023-01-15 – 2023-01-16 (×3): 237 mL via ORAL

## 2023-01-15 MED ORDER — HALOPERIDOL LACTATE 5 MG/ML IJ SOLN
1.0000 mg | Freq: Once | INTRAMUSCULAR | Status: AC
  Administered 2023-01-15: 1 mg via INTRAVENOUS
  Filled 2023-01-15: qty 1

## 2023-01-15 MED ORDER — AMOXICILLIN 500 MG PO CAPS
500.0000 mg | ORAL_CAPSULE | Freq: Three times a day (TID) | ORAL | Status: DC
  Administered 2023-01-15 – 2023-01-16 (×4): 500 mg via ORAL
  Filled 2023-01-15 (×5): qty 1

## 2023-01-15 NOTE — Progress Notes (Signed)
Patient attempting to get out of bed, pulling at IV, confused, not easily reoriented. notified Larkin Ina NP via secure chat. Was given order for one time dose of haldol. One time dose of haldol given, repositioned/provided pericare and linen change to patient. Bed alarm on.

## 2023-01-15 NOTE — Plan of Care (Signed)

## 2023-01-15 NOTE — Consult Note (Signed)
Consultation Note Date: 01/15/2023   Patient Name: Lindsay Snyder  DOB: [birth year]  MRN: [id number]  Age / Sex: 77 y.o., female  PCP: Doreene Nest, NP Referring Physician: Kathrynn Running, MD  Reason for Consultation: Establishing goals of care  HPI/Patient Profile: Lindsay Snyder is a 77 y.o. female with a history of dementia brought in for altered mental status from nursing home.  Patient is unable to provide significant history.  Review of records demonstrates the patient was discharged 8 days ago after treatment for metabolic encephalopathy secondary to a UTI   Clinical Assessment and Goals of Care: Notes and labs reviewed from current and previous admissions.  In to see patient, no family at bedside.  She denies complaint.  Patient advises that she has 6 children.  She states she has been divorced multiple times.  She fixates on discussing her most recent divorce and her "small" children.  With attempting to change the subject, patient returns to discussing the divorce.  Called patient's daughter [name] who is listed as emergency contact.  [name] states patient is legally separated, but not divorced.  She states there is a total of 4 children.  [name] discusses her mother's status and healthcare.  She discusses that her her mother's dementia has progressed rapidly.  She states her mother would never want blood products.  She states she her mother would not want CPR if the situation was "hopeless".  She states quality of life is very important to her mother.  Daughter states as a Scientist, product/process development, she has completed an advanced directive, but is unsure of all of the specifics to the document.  She states she and her family had wanted to update the document as her brother Christiane Ha who lives in [place] is HPOA, and a friend with no knowledge of the patient's healthcare is secondary HPOA.  She discusses  that they were encouraged to choose power of attorneys that were not from the same address, or were not part of the family.    Discussed meeting tomorrow morning at bedside to review the document and discuss further.     SUMMARY OF RECOMMENDATIONS   Plans for family meeting tomorrow morning at bedside.      Primary Diagnoses: Present on Admission:  Altered mental status  UTI (urinary tract infection)  Mood disorder (HCC)  Hyperlipidemia  Insomnia due to stress  GAD (generalized anxiety disorder)  Gastroesophageal reflux disease  Dementia (HCC)   I have reviewed the medical record, interviewed the patient and family, and examined the patient. The following aspects are pertinent.  Past Medical History:  Diagnosis Date   Allergy 010150   Anxiety 08/30/2019   Appendicitis 8    C. difficile diarrhea 02/04/2022   Cancer [facility])    daughter states patient never had cancer   Dementia (HCC)    GERD (gastroesophageal reflux disease) 01/30/2019   Otitis externa 10/14/2020   Polyp of colon    Benign   Social History   Socioeconomic History   Marital status:  Married    Spouse name: Not on file   Number of children: Not on file   Years of education: Not on file   Highest education level: 12th grade  Occupational History   Not on file  Tobacco Use   Smoking status: Never   Smokeless tobacco: Never  Vaping Use   Vaping status: Never Used  Substance and Sexual Activity   Alcohol use: No   Drug use: Never   Sexual activity: Not Currently  Other Topics Concern   Not on file  Social History Narrative   From [place], moved to [place] for her ill daughter, moved to [place], moved back to [place].   Retired.   Lives in [place].   Enjoys babysitting, writing, reading, cooking.      Social Drivers of Corporate investment banker Strain: Low Risk  (09/04/2022)   Overall Financial Resource Strain (CARDIA)    Difficulty of Paying Living Expenses: Not very hard  Food Insecurity: No  Food Insecurity (01/14/2023)   Hunger Vital Sign    Worried About Running Out of Food in the Last Year: Never true    Ran Out of Food in the Last Year: Never true  Transportation Needs: No Transportation Needs (01/14/2023)   PRAPARE - Administrator, Civil Service (Medical): No    Lack of Transportation (Non-Medical): No  Physical Activity: Inactive (09/04/2022)   Exercise Vital Sign    Days of Exercise per Week: 0 days    Minutes of Exercise per Session: 20 min  Stress: No Stress Concern Present (09/04/2022)   [employer] of Occupational Health - Occupational Stress Questionnaire    Feeling of Stress : Only a little  Social Connections: Moderately Integrated (09/04/2022)   Social Connection and Isolation Panel [NHANES]    Frequency of Communication with Friends and Family: More than three times a week    Frequency of Social Gatherings with Friends and Family: More than three times a week    Attends Religious Services: More than 4 times per year    Active Member of [employer] or Organizations: Yes    Attends Engineer, structural: More than 4 times per year    Marital Status: Separated   Family History  Problem Relation Age of Onset   Diabetes Mother        Deceased   Heart attack Father 27       Deceased   Cancer Father        Colon   Breast cancer Paternal Aunt    Scheduled Meds:  amoxicillin  500 mg Oral Q8H   donepezil  5 mg Oral QHS   DULoxetine  40 mg Oral Daily   enoxaparin (LOVENOX) injection  0.5 mg/kg Subcutaneous Q24H   feeding supplement  237 mL Oral BID BM   lithium carbonate  150 mg Oral Daily   memantine  10 mg Oral Daily   mirtazapine  15 mg Oral QHS   oxybutynin  15 mg Oral QHS   traZODone  100 mg Oral QHS   Continuous Infusions: PRN Meds:.ibuprofen, ondansetron **OR** ondansetron (ZOFRAN) IV Medications Prior to Admission:  Prior to Admission medications   Medication Sig Start Date End Date Taking? Authorizing Provider  cetirizine  (ZYRTEC) 10 MG tablet Take 10 mg by mouth daily.   Yes [provider]  diclofenac Sodium (VOLTAREN) 1 % GEL Apply 4 g topically 3 (three) times daily as needed (pain). (Apply to affected area of legs) 12/24/22  Yes [provider]  donepezil (ARICEPT) 5 MG tablet Take 5 mg by mouth at bedtime. 09/29/20  Yes [provider]  DULoxetine (CYMBALTA) 20 MG capsule TAKE 2 CAPSULES(40 MG) BY MOUTH DAILY FOR ANXIETY 08/26/22  Yes Doreene Nest, NP  lithium carbonate 150 MG capsule Take 150 mg by mouth daily. 12/01/21  Yes [provider]  memantine (NAMENDA) 5 MG tablet Take 2 tablets (10 mg total) by mouth daily. For memory 11/29/22  Yes Doreene Nest, NP  mirtazapine (REMERON) 15 MG tablet Take 15 mg by mouth at bedtime.   Yes [provider]  ondansetron (ZOFRAN-ODT) 4 MG disintegrating tablet Take 1 tablet (4 mg total) by mouth every 8 (eight) hours as needed for nausea or vomiting. 02/09/22  Yes Doreene Nest, NP  oxybutynin (DITROPAN XL) 15 MG 24 hr tablet TAKE 1 TABLET(15 MG) BY MOUTH AT BEDTIME FOR OVERACTIVE BLADDER 01/07/23  Yes Doreene Nest, NP  traZODone (DESYREL) 100 MG tablet Take 100 mg by mouth at bedtime. 12/01/21  Yes [provider]   Allergies  Allergen Reactions   Crestor [Rosuvastatin Calcium] Rash and Other (See Comments)    Myalgia   Tylenol [Acetaminophen] Rash   Review of Systems  All other systems reviewed and are negative.   Physical Exam Pulmonary:     Effort: Pulmonary effort is normal.  Neurological:     Mental Status: She is alert.     Vital Signs: BP 113/61 (BP Location: Right Wrist)   Pulse 65   Temp 97.6 F (36.4 C)   Resp 16   Ht 5\' 2"  (1.575 m)   Wt 106.7 kg   SpO2 97%   BMI 43.02 kg/m  Pain Scale: 0-10   Pain Score: 0-No pain   SpO2: SpO2: 97 % O2 Device:SpO2: 97 % O2 Flow Rate: .O2 Flow Rate (L/min): 2 L/min  IO: Intake/output summary:  Intake/Output Summary (Last 24  hours) at 01/15/2023 1153 Last data filed at 01/15/2023 0355 Gross per 24 hour  Intake 100 ml  Output 200 ml  Net -100 ml    LBM: Last BM Date :  (unknown) Baseline Weight: Weight: 106.7 kg Most recent weight: Weight: 106.7 kg       Signed by: Morton Stall, NP   Please contact Palliative Medicine Team phone at [phone] for questions and concerns.  For individual provider: See Loretha Stapler

## 2023-01-15 NOTE — Progress Notes (Signed)
PROGRESS NOTE    Lindsay Snyder  WUJ:[id number] DOB: 07-Nov-1945 DOA: 01/13/2023 PCP: Doreene Nest, NP     Brief Narrative:   From admission h and p Ms. Lindsay Snyder is a 77 year old female with history of Alzheimer dementia, depression, anxiety, history of rhabdomyolysis, who presents emergency department for chief concerns of altered mental status.   Patient is from [employer] [facility].    Assessment & Plan:   Principal Problem:   Altered mental status Active Problems:   Insomnia due to stress   Hyperlipidemia   GAD (generalized anxiety disorder)   Gastroesophageal reflux disease   Recurrent falls   Dementia (HCC)   Mood disorder (HCC)   UTI (urinary tract infection)   History of rhabdomyolysis   Goals of care, counseling/discussion  # Acute encephalopathy vs delirium # Dementia # UTI? Patient with known dementia. Recently hospitalized and discharged about a week ago, treated for uti though no urine culture that hospitalization. Has a history of growing pseudomonas and enterococcus earlier this year. No fever. For what it's worth she denies to me abdominal/suprapubic pain and dysuria. She is incontinent to urine but unclear whether that's acute or chronic. Family does think antibiotics help when she gets like this and do think her mental status has changed overnight - after discussion w/ family will continue abx for now, f/u culture results. Culture growing enterococcus, pan-sensitive - transition ceftriaxone to amoxicillin plan to complete a 5-7 day course  # Mood disorder Lithium level low - continue lithium, mirtazapine, cymbalta  # Dementia - home donepezil and memantine  # Goals-of-care Homero Fellers discussion 12/6 with family about patient's advanced dementia, quality of life (family admits it's quite poor), prognosis, etc. We discussed code status, what interventions she would or would not want, the concept of comfort care, hospice services, etc. They were receptive  to this information but want to think things over and discuss w/ family members. Palliative is engaged, planning on family meeting tomorrow 12/18   DVT prophylaxis: lovenox Code Status: full for now Family Communication: daughter updated telephonically 12/17  Level of care: Telemetry Medical Status is: Inpatient Remains inpatient appropriate because: family meeting tomorrow    Consultants:  none  Procedures: none  Antimicrobials:  Ceftriaxone>amoxicillin   Subjective: No pain or other complaints, tolerated some lunch  Objective: Vitals:   01/14/23 1644 01/14/23 1845 01/15/23 0611 01/15/23 0752  BP: (!) 139/58 130/60 (!) 140/68 113/61  Pulse: 64 68 68 65  Resp: 16 16  16   Temp: 98 F (36.7 C)  97.9 F (36.6 C) 97.6 F (36.4 C)  TempSrc:      SpO2: 100% 100% 97% 97%  Weight:      Height:        Intake/Output Summary (Last 24 hours) at 01/15/2023 1406 Last data filed at 01/15/2023 0355 Gross per 24 hour  Intake 100 ml  Output 200 ml  Net -100 ml   Filed Weights   01/13/23 1309  Weight: 106.7 kg    Examination:  General exam: Appears calm and comfortable  Respiratory system: Clear to auscultation. Respiratory effort normal. Cardiovascular system: S1 & S2 heard, RRR.   Gastrointestinal system: Abdomen is nondistended, obese and soft Central nervous system: Alert and oriented to self. No focal deficits Extremities: Symmetric 5 x 5 power. Skin: bruises on arms. LE edema to calves Psychiatry: confused, calm    Data Reviewed: I have personally reviewed following labs and imaging studies  CBC: Recent Labs  Lab 01/13/23 0726 01/14/23  1906 01/15/23 0451  WBC 6.5 6.4 6.4  HGB 14.3 13.4 13.5  HCT 45.6 40.8 41.5  MCV 93.4 90.1 90.8  PLT 233 259 271   Basic Metabolic Panel: Recent Labs  Lab 01/13/23 0726 01/14/23 1906 01/15/23 0451  NA 138 140 139  K 3.5 3.5 3.1*  CL 105 108 105  CO2 23 23 27   GLUCOSE 96 100* 88  BUN 11 7* 7*  CREATININE  0.98 0.89 0.83  CALCIUM 8.7* 8.4* 8.4*   GFR: Estimated Creatinine Clearance: 64.1 mL/min (by C-G formula based on SCr of 0.83 mg/dL). Liver Function Tests: Recent Labs  Lab 01/13/23 0726  AST 25  ALT 28  ALKPHOS 55  BILITOT 1.0  PROT 6.4*  ALBUMIN 3.4*   No results for input(s): "LIPASE", "AMYLASE" in the last 168 hours. No results for input(s): "AMMONIA" in the last 168 hours. Coagulation Profile: No results for input(s): "INR", "PROTIME" in the last 168 hours. Cardiac Enzymes: Recent Labs  Lab 01/13/23 0726  CKTOTAL 160   BNP (last 3 results) Recent Labs    03/01/22 1006  PROBNP 36.0   HbA1C: No results for input(s): "HGBA1C" in the last 72 hours. CBG: No results for input(s): "GLUCAP" in the last 168 hours. Lipid Profile: No results for input(s): "CHOL", "HDL", "LDLCALC", "TRIG", "CHOLHDL", "LDLDIRECT" in the last 72 hours. Thyroid Function Tests: No results for input(s): "TSH", "T4TOTAL", "FREET4", "T3FREE", "THYROIDAB" in the last 72 hours. Anemia Panel: No results for input(s): "VITAMINB12", "FOLATE", "FERRITIN", "TIBC", "IRON", "RETICCTPCT" in the last 72 hours. Urine analysis:    Component Value Date/Time   COLORURINE YELLOW (A) 01/13/2023 1254   APPEARANCEUR HAZY (A) 01/13/2023 1254   LABSPEC 1.011 01/13/2023 1254   PHURINE 6.0 01/13/2023 1254   GLUCOSEU NEGATIVE 01/13/2023 1254   GLUCOSEU NEGATIVE 09/25/2022 1310   HGBUR NEGATIVE 01/13/2023 1254   BILIRUBINUR NEGATIVE 01/13/2023 1254   BILIRUBINUR neg 09/06/2022 1209   KETONESUR NEGATIVE 01/13/2023 1254   PROTEINUR NEGATIVE 01/13/2023 1254   UROBILINOGEN 0.2 09/25/2022 1310   NITRITE NEGATIVE 01/13/2023 1254   LEUKOCYTESUR MODERATE (A) 01/13/2023 1254   Sepsis Labs: @LABRCNTIP (procalcitonin:4,lacticidven:4)  ) Recent Results (from the past 240 hours)  Urine Culture     Status: Abnormal   Collection Time: 01/13/23 12:54 PM   Specimen: Urine, Catheterized  Result Value Ref Range Status    Specimen Description   Final    URINE, CATHETERIZED Performed at [facility], [address], [place], [place] [address]    Special Requests   Final    NONE Performed at [facility], [address] [place], [place] [address]    Culture 80,000 COLONIES/mL ENTEROCOCCUS FAECALIS (A)  Final   Report Status 01/15/2023 FINAL  Final   Organism ID, Bacteria ENTEROCOCCUS FAECALIS (A)  Final      Susceptibility   Enterococcus faecalis - MIC*    AMPICILLIN <=2 SENSITIVE Sensitive     NITROFURANTOIN <=16 SENSITIVE Sensitive     VANCOMYCIN 1 SENSITIVE Sensitive     * 80,000 COLONIES/mL ENTEROCOCCUS FAECALIS  Blood culture (routine x 2)     Status: None (Preliminary result)   Collection Time: 01/13/23  3:41 PM   Specimen: BLOOD  Result Value Ref Range Status   Specimen Description BLOOD RIGHT ANTECUBITAL  Final   Special Requests   Final    BOTTLES DRAWN AEROBIC AND ANAEROBIC Blood Culture results may not be optimal due to an inadequate volume of blood received in culture bottles  Culture   Final    NO GROWTH 2 DAYS Performed at [facility], [address] [place], [place] [address]    Report Status PENDING  Incomplete  SARS Coronavirus 2 by RT PCR (hospital order, performed in [facility] hospital lab) *cepheid single result test* Anterior Nasal Swab     Status: None   Collection Time: 01/13/23  4:29 PM   Specimen: Anterior Nasal Swab  Result Value Ref Range Status   SARS Coronavirus 2 by RT PCR NEGATIVE NEGATIVE Final    Comment: (NOTE) SARS-CoV-2 target nucleic acids are NOT DETECTED.  The SARS-CoV-2 RNA is generally detectable in upper and lower respiratory specimens during the acute phase of infection. The lowest concentration of SARS-CoV-2 viral copies this assay can detect is 250 copies / mL. A negative result does not preclude SARS-CoV-2 infection and should not be used as the sole basis for treatment or other patient management decisions.  A  negative result may occur with improper specimen collection / handling, submission of specimen other than nasopharyngeal swab, presence of viral mutation(s) within the areas targeted by this assay, and inadequate number of viral copies (<250 copies / mL). A negative result must be combined with clinical observations, patient history, and epidemiological information.  Fact Sheet for Patients:   RoadLapTop.co.za  Fact Sheet for Healthcare Providers: http://kim-miller.com/  This test is not yet approved or  cleared by the [place] FDA and has been authorized for detection and/or diagnosis of SARS-CoV-2 by FDA under an Emergency Use Authorization (EUA).  This EUA will remain in effect (meaning this test can be used) for the duration of the COVID-19 declaration under Section 564(b)(1) of the Act, 21 U.S.C. section 360bbb-3(b)(1), unless the authorization is terminated or revoked sooner.  Performed at [facility], [address] [place], [place] [address]   Culture, blood (Routine X 2) w Reflex to ID Panel     Status: None (Preliminary result)   Collection Time: 01/13/23  5:43 PM   Specimen: BLOOD  Result Value Ref Range Status   Specimen Description BLOOD BLOOD RIGHT FOREARM  Final   Special Requests   Final    BOTTLES DRAWN AEROBIC AND ANAEROBIC Blood Culture results may not be optimal due to an inadequate volume of blood received in culture bottles   Culture   Final    NO GROWTH 2 DAYS Performed at [facility], [address], [place], [place] [address]    Report Status PENDING  Incomplete         Radiology Studies: No results found.       Scheduled Meds:  amoxicillin  500 mg Oral Q8H   donepezil  5 mg Oral QHS   DULoxetine  40 mg Oral Daily   enoxaparin (LOVENOX) injection  0.5 mg/kg Subcutaneous Q24H   feeding supplement  237 mL Oral BID BM   lithium carbonate  150 mg Oral Daily   memantine   10 mg Oral Daily   mirtazapine  15 mg Oral QHS   oxybutynin  15 mg Oral QHS   potassium chloride  60 mEq Oral Once   traZODone  100 mg Oral QHS   Continuous Infusions:     LOS: 2 days     Silvano Bilis, MD [facility]   If 7PM-7AM, please contact night-coverage [email or web address] Password [email or web address] 01/15/2023, 2:06 PM

## 2023-01-16 DIAGNOSIS — Z7189 Other specified counseling: Secondary | ICD-10-CM | POA: Diagnosis not present

## 2023-01-16 DIAGNOSIS — R4182 Altered mental status, unspecified: Secondary | ICD-10-CM | POA: Diagnosis not present

## 2023-01-16 LAB — CBC
HCT: 42.3 % (ref 36.0–46.0)
Hemoglobin: 13.9 g/dL (ref 12.0–15.0)
MCH: 29.4 pg (ref 26.0–34.0)
MCHC: 32.9 g/dL (ref 30.0–36.0)
MCV: 89.6 fL (ref 80.0–100.0)
Platelets: 277 10*3/uL (ref 150–400)
RBC: 4.72 MIL/uL (ref 3.87–5.11)
RDW: 13.1 % (ref 11.5–15.5)
WBC: 7.8 10*3/uL (ref 4.0–10.5)
nRBC: 0 % (ref 0.0–0.2)

## 2023-01-16 LAB — BASIC METABOLIC PANEL
Anion gap: 6 (ref 5–15)
BUN: 10 mg/dL (ref 8–23)
CO2: 25 mmol/L (ref 22–32)
Calcium: 8.9 mg/dL (ref 8.9–10.3)
Chloride: 109 mmol/L (ref 98–111)
Creatinine, Ser: 0.96 mg/dL (ref 0.44–1.00)
GFR, Estimated: >60 mL/min (ref >60)
Glucose, Bld: 110 mg/dL — ABNORMAL HIGH (ref 70–99)
Potassium: 4.2 mmol/L (ref 3.5–5.1)
Sodium: 140 mmol/L (ref 135–145)

## 2023-01-16 MED ORDER — HALOPERIDOL 0.5 MG PO TABS: 2.0000 mg | ORAL_TABLET | Freq: Once | ORAL | Status: DC

## 2023-01-16 MED ORDER — AMOXICILLIN 500 MG PO CAPS: 500.0000 mg | ORAL_CAPSULE | Freq: Three times a day (TID) | ORAL | Status: AC

## 2023-01-16 NOTE — Care Management Important Message (Signed)
Important Message  Patient Details  Name: Lindsay Snyder MRN: [id number] Date of Birth: [birth year]   Important Message Given:  N/A - LOS <3 / Initial given by admissions     [name] 01/16/2023, 10:36 AM

## 2023-01-16 NOTE — Plan of Care (Signed)

## 2023-01-16 NOTE — Care Management Important Message (Signed)
Important Message  Patient Details  Name: Lindsay Snyder MRN: [id number] Date of Birth: 1945/12/24   Important Message Given:  Yes - Medicare IM     Bernadette Hoit 01/16/2023, 2:23 PM

## 2023-01-16 NOTE — Discharge Summary (Signed)
Lindsay Snyder ZHY:[id number] DOB: 15-Sep-1945 DOA: 01/13/2023  PCP: Doreene Nest, NP  Admit date: 01/13/2023 Discharge date: 01/16/2023  Time spent: 35 minutes  Recommendations for Outpatient Follow-up:  Consider PCP f/u     Discharge Diagnoses:  Principal Problem:   Altered mental status Active Problems:   Insomnia due to stress   Hyperlipidemia   GAD (generalized anxiety disorder)   Gastroesophageal reflux disease   Recurrent falls   Dementia (HCC)   Mood disorder (HCC)   UTI (urinary tract infection)   History of rhabdomyolysis   Goals of care, counseling/discussion   Discharge Condition: stable  Diet recommendation: regular  Filed Weights   01/13/23 1309  Weight: 106.7 kg    History of present illness:  From admission h and p Lindsay Snyder is a 77 year old female with history of Alzheimer dementia, depression, anxiety, history of rhabdomyolysis, who presents emergency department for chief concerns of altered mental status.   Patient is from [facility].  Hospital Course:  Patient presents with altered mental status. This is most likely delirium from her advancing severe dementia. Family however reports UTIs tend to provoke these symptoms. Unable to get a good history from patient but she denies symptoms of UTI and there is no fever, suprapubic tenderness, leukocytosis, cva tenderness, etc. Urine culture did grow enterococcus. We ultimately decided to treat, will go home on amoxicillin. Bigger picture issue is the patient's severe advancing dementia, frequent hospitalizations, etc. Several advanced care planning discussions were had between patient's children, attending physician, and palliative services. Patient ultimately made dnr/dni, children are still considering next steps but are strongly considering pursuing hospice, particularly should patient's mental status continue to deteriorate.   Procedures: none   Consultations: palliative  Discharge  Exam: Vitals:   01/16/23 0327 01/16/23 0747  BP: (!) 130/57 127/75  Pulse: 72 72  Resp: 17 16  Temp: 97.9 F (36.6 C) 98 F (36.7 C)  SpO2: 98% 94%    General: NAD, sleeping, rouses Cardiovascular: RRR Respiratory: CTAB Abdomen: soft, non-tender  Discharge Instructions   Discharge Instructions     Diet general   Complete by: As directed    Increase activity slowly   Complete by: As directed       Allergies as of 01/16/2023       Reactions   Crestor [rosuvastatin Calcium] Rash, Other (See Comments)   Myalgia   Tylenol [acetaminophen] Rash        Medication List     TAKE these medications    amoxicillin 500 MG capsule Commonly known as: AMOXIL Take 1 capsule (500 mg total) by mouth 3 (three) times daily for 5 days.   cetirizine 10 MG tablet Commonly known as: ZYRTEC Take 10 mg by mouth daily.   diclofenac Sodium 1 % Gel Commonly known as: VOLTAREN Apply 4 g topically 3 (three) times daily as needed (pain). (Apply to affected area of legs)   donepezil 5 MG tablet Commonly known as: ARICEPT Take 5 mg by mouth at bedtime.   DULoxetine 20 MG capsule Commonly known as: CYMBALTA TAKE 2 CAPSULES(40 MG) BY MOUTH DAILY FOR ANXIETY   lithium carbonate 150 MG capsule Take 150 mg by mouth daily.   memantine 5 MG tablet Commonly known as: NAMENDA Take 2 tablets (10 mg total) by mouth daily. For memory   mirtazapine 15 MG tablet Commonly known as: REMERON Take 15 mg by mouth at bedtime.   ondansetron 4 MG disintegrating tablet Commonly known as: ZOFRAN-ODT Take 1  tablet (4 mg total) by mouth every 8 (eight) hours as needed for nausea or vomiting.   oxybutynin 15 MG 24 hr tablet Commonly known as: DITROPAN XL TAKE 1 TABLET(15 MG) BY MOUTH AT BEDTIME FOR OVERACTIVE BLADDER   traZODone 100 MG tablet Commonly known as: DESYREL Take 100 mg by mouth at bedtime.       Allergies  Allergen Reactions   Crestor [Rosuvastatin Calcium] Rash and Other  (See Comments)    Myalgia   Tylenol [Acetaminophen] Rash    Follow-up Information     Doreene Nest, NP Follow up.   Specialty: Internal Medicine Why: Hospital follow up Contact information: [address] [place] [address] [phone]                  The results of significant diagnostics from this hospitalization (including imaging, microbiology, ancillary and laboratory) are listed below for reference.    Significant Diagnostic Studies: CT Head Wo Contrast Result Date: 01/13/2023 CLINICAL DATA:  Mental status change, unknown cause. New onset confusion. EXAM: CT HEAD WITHOUT CONTRAST TECHNIQUE: Contiguous axial images were obtained from the base of the skull through the vertex without intravenous contrast. RADIATION DOSE REDUCTION: This exam was performed according to the departmental dose-optimization program which includes automated exposure control, adjustment of the mA and/or kV according to patient size and/or use of iterative reconstruction technique. COMPARISON:  MR head without contrast 01/02/2023. CT head without contrast 01/01/2023. FINDINGS: Brain: No acute infarct, hemorrhage, or mass lesion is present. Mild atrophy and white matter changes are stable. Deep brain nuclei are within normal limits. No acute or focal cortical abnormalities are present. No significant extraaxial fluid collection is present. The brainstem and cerebellum are within normal limits. Midline structures are within normal limits. Vascular: No hyperdense vessel or unexpected calcification. Skull: Calvarium is intact. No focal lytic or blastic lesions are present. No significant extracranial soft tissue lesion is present. Sinuses/Orbits: A right mastoid effusion is present. The paranasal sinuses and mastoid air cells are otherwise clear. The globes and orbits are within normal limits. IMPRESSION: 1. No acute intracranial abnormality or significant interval change. 2. Stable mild atrophy and  white matter disease. This likely reflects the sequela of chronic microvascular ischemia. 3. Right mastoid effusion. Electronically Signed   By: Marin Roberts M.D.   On: 01/13/2023 12:19   DG Chest Port 1 View Result Date: 01/13/2023 CLINICAL DATA:  Weakness.  Altered mental status. EXAM: PORTABLE CHEST 1 VIEW COMPARISON:  AP chest 01/01/2023 FINDINGS: Cardiac silhouette is again at the upper limits of normal size. Mediastinal contours are within normal limits. Mild-to-moderate atherosclerotic calcifications within the aortic arch. Unchanged mild to moderate chronic bilateral interstitial thickening compared to multiple prior radiographs. No acute airspace opacity. No pleural effusion pneumothorax. Mild-to-moderate multilevel degenerative disc changes of the thoracic spine with minimal dextrocurvature of the midthoracic spine. Severe left glenohumeral osteoarthritis with likely old healed fracture of the surgical neck, unchanged. IMPRESSION: Unchanged mild to moderate chronic bilateral interstitial thickening. No acute airspace opacity. Electronically Signed   By: Neita Garnet M.D.   On: 01/13/2023 11:57   MR BRAIN WO CONTRAST Result Date: 01/03/2023 CLINICAL DATA:  Transient ischemic attack EXAM: MRI HEAD WITHOUT CONTRAST TECHNIQUE: Multiplanar, multiecho pulse sequences of the brain and surrounding structures were obtained without intravenous contrast. COMPARISON:  09/20/2020 FINDINGS: Brain: No acute infarct, mass effect or extra-axial collection. No chronic microhemorrhage or siderosis. There is multifocal hyperintense T2-weighted signal within the white matter. Generalized  volume loss. The midline structures are normal. Vascular: Normal flow voids. Skull and upper cervical spine: Normal marrow signal. Sinuses/Orbits: Right mastoid effusion. Normal orbits and paranasal sinuses. Other: None IMPRESSION: 1. No acute intracranial abnormality. 2. Findings of chronic small vessel ischemia and volume  loss. Electronically Signed   By: Deatra Robinson M.D.   On: 01/03/2023 01:42   ECHOCARDIOGRAM COMPLETE Result Date: 01/02/2023    ECHOCARDIOGRAM REPORT   Patient Name:   JULIYA ANDO Date of Exam: 01/02/2023 Medical Rec #:  [id number]      Height:       62.0 in Accession #:    [id number]     Weight:       235.3 lb Date of Birth:  [birth year]      BSA:          2.048 m Patient Age:    77 years       BP:           142/66 mmHg Patient Gender: F              HR:           71 bpm. Exam Location:  [facility] Procedure: 2D Echo, Cardiac Doppler and Color Doppler Indications:     Elevated Troponin  History:         Patient has prior history of Echocardiogram examinations, most                  recent 04/13/2022. CHF, Signs/Symptoms:Murmur; Risk                  Factors:Dyslipidemia and Non-Smoker. Patient with Dementia is                  unable to follow instructions.  Sonographer:     Mikki Harbor Referring Phys:  [id number] Emeline General Diagnosing Phys: Julien Nordmann MD IMPRESSIONS  1. Left ventricular ejection fraction, by estimation, is 60 to 65%. The left ventricle has normal function. The left ventricle has no regional wall motion abnormalities. Left ventricular diastolic parameters are indeterminate.  2. Right ventricular systolic function is normal. The right ventricular size is normal. There is mildly elevated pulmonary artery systolic pressure. The estimated right ventricular systolic pressure is 36.7 mmHg.  3. The mitral valve is normal in structure. Mild mitral valve regurgitation. No evidence of mitral stenosis.  4. The aortic valve is calcified. There is moderate calcification of the aortic valve. Aortic valve regurgitation is not visualized. Mild to moderate aortic valve stenosis. Aortic valve area, by VTI measures 1.59 cm. Aortic valve mean gradient measures  17.0 mmHg. Aortic valve Vmax measures 2.75 m/s.  5. The inferior vena cava is normal in size with greater than 50% respiratory variability, suggesting  right atrial pressure of 3 mmHg. FINDINGS  Left Ventricle: Left ventricular ejection fraction, by estimation, is 60 to 65%. The left ventricle has normal function. The left ventricle has no regional wall motion abnormalities. The left ventricular internal cavity size was normal in size. There is  no left ventricular hypertrophy. Left ventricular diastolic parameters are indeterminate. Right Ventricle: The right ventricular size is normal. No increase in right ventricular wall thickness. Right ventricular systolic function is normal. There is mildly elevated pulmonary artery systolic pressure. The tricuspid regurgitant velocity is 2.68  m/s, and with an assumed right atrial pressure of 8 mmHg, the estimated right ventricular systolic pressure is 36.7 mmHg. Left Atrium: Left atrial size was normal in size. Right Atrium:  Right atrial size was normal in size. Pericardium: There is no evidence of pericardial effusion. Mitral Valve: The mitral valve is normal in structure. There is mild calcification of the mitral valve leaflet(s). Mild mitral annular calcification. Mild mitral valve regurgitation. No evidence of mitral valve stenosis. MV peak gradient, 7.6 mmHg. The mean mitral valve gradient is 3.0 mmHg. Tricuspid Valve: The tricuspid valve is normal in structure. Tricuspid valve regurgitation is mild . No evidence of tricuspid stenosis. Aortic Valve: The aortic valve is calcified. There is moderate calcification of the aortic valve. Aortic valve regurgitation is not visualized. Mild aortic stenosis is present. Aortic valve mean gradient measures 17.0 mmHg. Aortic valve peak gradient measures 30.2 mmHg. Aortic valve area, by VTI measures 1.59 cm. Pulmonic Valve: The pulmonic valve was normal in structure. Pulmonic valve regurgitation is not visualized. No evidence of pulmonic stenosis. Aorta: The aortic root is normal in size and structure. Venous: The inferior vena cava is normal in size with greater than 50%  respiratory variability, suggesting right atrial pressure of 3 mmHg. IAS/Shunts: No atrial level shunt detected by color flow Doppler.  LEFT VENTRICLE PLAX 2D LVIDd:         3.90 cm   Diastology LVIDs:         2.90 cm   LV e' medial:    7.94 cm/s LV PW:         1.40 cm   LV E/e' medial:  14.6 LV IVS:        1.50 cm   LV e' lateral:   11.90 cm/s LVOT diam:     2.00 cm   LV E/e' lateral: 9.7 LV SV:         107 LV SV Index:   52 LVOT Area:     3.14 cm  RIGHT VENTRICLE RV Basal diam:  3.90 cm RV Mid diam:    3.60 cm RV S prime:     14.70 cm/s LEFT ATRIUM             Index        RIGHT ATRIUM           Index LA diam:        3.70 cm 1.81 cm/m   RA Area:     14.90 cm LA Vol (A2C):   68.0 ml 33.20 ml/m  RA Volume:   37.30 ml  18.21 ml/m LA Vol (A4C):   25.2 ml 12.30 ml/m LA Biplane Vol: 43.9 ml 21.43 ml/m  AORTIC VALVE                     PULMONIC VALVE AV Area (Vmax):    1.61 cm      PV Vmax:       1.54 m/s AV Area (Vmean):   1.63 cm      PV Peak grad:  9.5 mmHg AV Area (VTI):     1.59 cm AV Vmax:           275.00 cm/s AV Vmean:          190.000 cm/s AV VTI:            0.676 m AV Peak Grad:      30.2 mmHg AV Mean Grad:      17.0 mmHg LVOT Vmax:         141.00 cm/s LVOT Vmean:        98.400 cm/s LVOT VTI:          0.342  m LVOT/AV VTI ratio: 0.51  AORTA Ao Root diam: 3.10 cm Ao Asc diam:  3.00 cm MITRAL VALVE                TRICUSPID VALVE MV Area (PHT): 3.03 cm     TR Peak grad:   28.7 mmHg MV Area VTI:   2.15 cm     TR Vmax:        268.00 cm/s MV Peak grad:  7.6 mmHg MV Mean grad:  3.0 mmHg     SHUNTS MV Vmax:       1.38 m/s     Systemic VTI:  0.34 m MV Vmean:      73.3 cm/s    Systemic Diam: 2.00 cm MV Decel Time: 250 msec MV E velocity: 116.00 cm/s MV A velocity: 111.00 cm/s MV E/A ratio:  1.05 Julien Nordmann MD Electronically signed by Julien Nordmann MD Signature Date/Time: 01/02/2023/12:45:22 PM    Final    DG Chest Portable 1 View Result Date: 01/01/2023 CLINICAL DATA:  Altered mental status and cough.  EXAM: PORTABLE CHEST 1 VIEW COMPARISON:  March 01, 2022 FINDINGS: The cardiac silhouette is borderline in size which may be technical in origin. There is moderate severity calcification of the aortic arch. Low lung volumes are noted. Mild, diffuse, stable chronic appearing increased interstitial lung markings are seen. A chronic deformity is seen involving the head and neck of the proximal left humerus. Multilevel degenerative changes are seen throughout the thoracic spine. IMPRESSION: Low lung volumes with mild, diffuse, stable chronic appearing increased interstitial lung markings. Electronically Signed   By: Aram Candela M.D.   On: 01/01/2023 04:02   CT HEAD WO CONTRAST ( ) Result Date: 01/01/2023 CLINICAL DATA:  Altered mental status.  Head trauma. EXAM: CT HEAD WITHOUT CONTRAST CT CERVICAL SPINE WITHOUT CONTRAST TECHNIQUE: Multidetector CT imaging of the head and cervical spine was performed following the standard protocol without intravenous contrast. Multiplanar CT image reconstructions of the cervical spine were also generated. RADIATION DOSE REDUCTION: This exam was performed according to the departmental dose-optimization program which includes automated exposure control, adjustment of the mA and/or kV according to patient size and/or use of iterative reconstruction technique. COMPARISON:  01/14/2022 FINDINGS: CT HEAD FINDINGS Brain: There is no mass, hemorrhage or extra-axial collection. The size and configuration of the ventricles and extra-axial CSF spaces are normal. There is hypoattenuation of the periventricular white matter, most commonly indicating chronic ischemic microangiopathy. Vascular: No abnormal hyperdensity of the major intracranial arteries or dural venous sinuses. No intracranial atherosclerosis. Skull: The visualized skull base, calvarium and extracranial soft tissues are normal. Sinuses/Orbits: Right mastoid effusion.  The orbits are normal. CT CERVICAL SPINE FINDINGS  Alignment: No static subluxation. Facets are aligned. Occipital condyles are normally positioned. Skull base and vertebrae: No acute fracture. Soft tissues and spinal canal: No prevertebral fluid or swelling. No visible canal hematoma. Disc levels: No advanced spinal canal or neural foraminal stenosis. Upper chest: No pneumothorax, pulmonary nodule or pleural effusion. Other: The cervical spine portion of the examination is markedly degraded by motion. IMPRESSION: 1. No acute intracranial abnormality. 2. Chronic ischemic microangiopathy. 3. No acute fracture or static subluxation of the cervical spine, allowing for marked motion degradation. Electronically Signed   By: Deatra Robinson M.D.   On: 01/01/2023 03:50   CT Cervical Spine Wo Contrast Result Date: 01/01/2023 CLINICAL DATA:  Altered mental status.  Head trauma. EXAM: CT HEAD WITHOUT CONTRAST CT CERVICAL SPINE WITHOUT CONTRAST TECHNIQUE: Multidetector CT  imaging of the head and cervical spine was performed following the standard protocol without intravenous contrast. Multiplanar CT image reconstructions of the cervical spine were also generated. RADIATION DOSE REDUCTION: This exam was performed according to the departmental dose-optimization program which includes automated exposure control, adjustment of the mA and/or kV according to patient size and/or use of iterative reconstruction technique. COMPARISON:  01/14/2022 FINDINGS: CT HEAD FINDINGS Brain: There is no mass, hemorrhage or extra-axial collection. The size and configuration of the ventricles and extra-axial CSF spaces are normal. There is hypoattenuation of the periventricular white matter, most commonly indicating chronic ischemic microangiopathy. Vascular: No abnormal hyperdensity of the major intracranial arteries or dural venous sinuses. No intracranial atherosclerosis. Skull: The visualized skull base, calvarium and extracranial soft tissues are normal. Sinuses/Orbits: Right mastoid effusion.   The orbits are normal. CT CERVICAL SPINE FINDINGS Alignment: No static subluxation. Facets are aligned. Occipital condyles are normally positioned. Skull base and vertebrae: No acute fracture. Soft tissues and spinal canal: No prevertebral fluid or swelling. No visible canal hematoma. Disc levels: No advanced spinal canal or neural foraminal stenosis. Upper chest: No pneumothorax, pulmonary nodule or pleural effusion. Other: The cervical spine portion of the examination is markedly degraded by motion. IMPRESSION: 1. No acute intracranial abnormality. 2. Chronic ischemic microangiopathy. 3. No acute fracture or static subluxation of the cervical spine, allowing for marked motion degradation. Electronically Signed   By: Deatra Robinson M.D.   On: 01/01/2023 03:50    Microbiology: Recent Results (from the past 240 hours)  Urine Culture     Status: Abnormal   Collection Time: 01/13/23 12:54 PM   Specimen: Urine, Catheterized  Result Value Ref Range Status   Specimen Description   Final    URINE, CATHETERIZED Performed at [facility], [address], [place], [place] [address]    Special Requests   Final    NONE Performed at [facility], [address] [place], [place] [address]    Culture 80,000 COLONIES/mL ENTEROCOCCUS FAECALIS (A)  Final   Report Status 01/15/2023 FINAL  Final   Organism ID, Bacteria ENTEROCOCCUS FAECALIS (A)  Final      Susceptibility   Enterococcus faecalis - MIC*    AMPICILLIN <=2 SENSITIVE Sensitive     NITROFURANTOIN <=16 SENSITIVE Sensitive     VANCOMYCIN 1 SENSITIVE Sensitive     * 80,000 COLONIES/mL ENTEROCOCCUS FAECALIS  Blood culture (routine x 2)     Status: None (Preliminary result)   Collection Time: 01/13/23  3:41 PM   Specimen: BLOOD  Result Value Ref Range Status   Specimen Description BLOOD RIGHT ANTECUBITAL  Final   Special Requests   Final    BOTTLES DRAWN AEROBIC AND ANAEROBIC Blood Culture results may not be optimal due to an  inadequate volume of blood received in culture bottles   Culture   Final    NO GROWTH 3 DAYS Performed at [facility], [address], [place], [place] [address]    Report Status PENDING  Incomplete  SARS Coronavirus 2 by RT PCR (hospital order, performed in [facility] hospital lab) *cepheid single result test* Anterior Nasal Swab     Status: None   Collection Time: 01/13/23  4:29 PM   Specimen: Anterior Nasal Swab  Result Value Ref Range Status   SARS Coronavirus 2 by RT PCR NEGATIVE NEGATIVE Final    Comment: (NOTE) SARS-CoV-2 target nucleic acids are NOT DETECTED.  The SARS-CoV-2 RNA is generally detectable in upper and lower  respiratory specimens during the acute phase of infection. The lowest concentration of SARS-CoV-2 viral copies this assay can detect is 250 copies / mL. A negative result does not preclude SARS-CoV-2 infection and should not be used as the sole basis for treatment or other patient management decisions.  A negative result may occur with improper specimen collection / handling, submission of specimen other than nasopharyngeal swab, presence of viral mutation(s) within the areas targeted by this assay, and inadequate number of viral copies (<250 copies / mL). A negative result must be combined with clinical observations, patient history, and epidemiological information.  Fact Sheet for Patients:   RoadLapTop.co.za  Fact Sheet for Healthcare Providers: http://kim-miller.com/  This test is not yet approved or  cleared by the [place] FDA and has been authorized for detection and/or diagnosis of SARS-CoV-2 by FDA under an Emergency Use Authorization (EUA).  This EUA will remain in effect (meaning this test can be used) for the duration of the COVID-19 declaration under Section 564(b)(1) of the Act, 21 U.S.C. section 360bbb-3(b)(1), unless the authorization is terminated or revoked  sooner.  Performed at [facility], [address] [place], [place] [address]   Culture, blood (Routine X 2) w Reflex to ID Panel     Status: None (Preliminary result)   Collection Time: 01/13/23  5:43 PM   Specimen: BLOOD  Result Value Ref Range Status   Specimen Description BLOOD BLOOD RIGHT FOREARM  Final   Special Requests   Final    BOTTLES DRAWN AEROBIC AND ANAEROBIC Blood Culture results may not be optimal due to an inadequate volume of blood received in culture bottles   Culture   Final    NO GROWTH 3 DAYS Performed at [facility], [address] [place], [place] [address]    Report Status PENDING  Incomplete     Labs: Basic Metabolic Panel: Recent Labs  Lab 01/13/23 0726 01/14/23 1906 01/15/23 0451 01/16/23 0513  NA 138 140 139 140  K 3.5 3.5 3.1* 4.2  CL 105 108 105 109  CO2 23 23 27 25   GLUCOSE 96 100* 88 110*  BUN 11 7* 7* 10  CREATININE 0.98 0.89 0.83 0.96  CALCIUM 8.7* 8.4* 8.4* 8.9   Liver Function Tests: Recent Labs  Lab 01/13/23 0726  AST 25  ALT 28  ALKPHOS 55  BILITOT 1.0  PROT 6.4*  ALBUMIN 3.4*   No results for input(s): "LIPASE", "AMYLASE" in the last 168 hours. No results for input(s): "AMMONIA" in the last 168 hours. CBC: Recent Labs  Lab 01/13/23 0726 01/14/23 1906 01/15/23 0451 01/16/23 0513  WBC 6.5 6.4 6.4 7.8  HGB 14.3 13.4 13.5 13.9  HCT 45.6 40.8 41.5 42.3  MCV 93.4 90.1 90.8 89.6  PLT 233 259 271 277   Cardiac Enzymes: Recent Labs  Lab 01/13/23 0726  CKTOTAL 160   BNP: BNP (last 3 results) No results for input(s): "BNP" in the last 8760 hours.  ProBNP (last 3 results) Recent Labs    03/01/22 1006  PROBNP 36.0    CBG: No results for input(s): "GLUCAP" in the last 168 hours.     Signed:  Silvano Bilis MD.  [facility] 01/16/2023, 11:30 AM

## 2023-01-16 NOTE — [place] FL2 (Addendum)
[place] MEDICAID FL2 LEVEL OF CARE FORM     IDENTIFICATION  Patient Name: Lindsay Snyder Birthdate: Dec 18, 1945 Sex: female Admission Date (Current Location): 01/13/2023  [facility] and [place] Number:      Facility and Address:  [facility] [address] [place], [place] [address]      Provider Number: [id number]  Attending Physician Name and Address:  Kathrynn Running, MD  Relative Name and Phone Number:  Georgeann Oppenheim (Sister)  [phone]    Current Level of Care: Hospital Recommended Level of Care: Memory Care Prior Approval Number:    Date Approved/Denied:   PASRR Number:    Discharge Plan: Domiciliary (Rest home)    Current Diagnoses: Patient Active Problem List   Diagnosis Date Noted   Goals of care, counseling/discussion 01/15/2023   Altered mental status 01/13/2023   History of rhabdomyolysis 01/13/2023   Acute encephalopathy 01/01/2023   UTI (urinary tract infection) 01/01/2023   Encephalopathy 01/01/2023   Lethargy 09/06/2022   Cancer (HCC) 05/02/2022   Vaginal itching 03/01/2022   AKI (acute kidney injury) (HCC) 02/05/2022   Dementia (HCC) 02/05/2022   Mood disorder (HCC) 02/05/2022   Overactive bladder 02/05/2022   Persistent cough for 3 weeks or longer 06/29/2021   Recurrent falls 06/29/2021   Osteoarthritis of hands, 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   Lower abdominal pain 02/03/2021   Urinary incontinence 02/03/2021   Memory changes 07/05/2020   Gastroesophageal reflux disease 07/05/2020   Generalized abdominal pain 03/01/2020   Environmental and seasonal allergies 01/14/2020   GAD (generalized anxiety disorder) 01/14/2020   Pain of left upper extremity 05/26/2019   Oral lesion 02/26/2019   Hemorrhoids 07/11/2017   Preventative health care 07/11/2017   Hyperlipidemia 07/10/2016   Insomnia due to stress 09/30/2014   Medicare annual wellness visit, subsequent 05/17/2014   Murmur, cardiac 05/17/2014   Varicose veins of  both lower extremities 04/27/2014   Rash and nonspecific skin eruption 04/15/2014   Osteopenia 04/15/2014   Obesity 04/15/2014    Orientation RESPIRATION BLADDER Height & Weight        Normal Incontinent Weight: 235 lb 3.7 oz (106.7 kg) Height:  5\' 2"  (157.5 cm)  BEHAVIORAL SYMPTOMS/MOOD NEUROLOGICAL BOWEL NUTRITION STATUS      Incontinent    AMBULATORY STATUS COMMUNICATION OF NEEDS Skin   Limited Assist Verbally Normal                       Personal Care Assistance Level of Assistance  Bathing, Feeding, Dressing Bathing Assistance: Limited assistance Feeding assistance: Limited assistance Dressing Assistance: Limited assistance     Functional Limitations Info             SPECIAL CARE FACTORS FREQUENCY                       Contractures Contractures Info: Not present    Additional Factors Info  Code Status, Allergies Code Status Info: FULL Allergies Info: Crestor (Rosuvastatin Calcium)  Tylenol (Acetaminophen)           Current Medications (01/16/2023):  This is the current hospital active medication list Current Facility-Administered Medications  Medication Dose Route Frequency Provider Last Rate Last Admin   amoxicillin (AMOXIL) capsule 500 mg  500 mg Oral Q8H Wouk, Wilfred Curtis, MD   500 mg at 01/16/23 0558   donepezil (ARICEPT) tablet 5 mg  5 mg Oral QHS Cox, Amy N, DO   5 mg at 01/15/23 2145   DULoxetine (  CYMBALTA) DR capsule 40 mg  40 mg Oral Daily Cox, Amy N, DO   40 mg at 01/16/23 0952   enoxaparin (LOVENOX) injection 52.5 mg  0.5 mg/kg Subcutaneous Q24H Cox, Amy N, DO   52.5 mg at 01/15/23 2144   feeding supplement (ENSURE ENLIVE / ENSURE PLUS) liquid 237 mL  237 mL Oral BID BM Wouk, Wilfred Curtis, MD   237 mL at 01/16/23 7564   lithium carbonate capsule 150 mg  150 mg Oral Daily Cox, Amy N, DO   150 mg at 01/15/23 2145   memantine (NAMENDA) tablet 10 mg  10 mg Oral Daily Cox, Amy N, DO   10 mg at 01/15/23 2144   mirtazapine (REMERON) tablet  15 mg  15 mg Oral QHS Cox, Amy N, DO   15 mg at 01/15/23 2144   ondansetron (ZOFRAN) tablet 4 mg  4 mg Oral Q6H PRN Cox, Amy N, DO       Or   ondansetron (ZOFRAN) injection 4 mg  4 mg Intravenous Q6H PRN Cox, Amy N, DO       oxybutynin (DITROPAN-XL) 24 hr tablet 15 mg  15 mg Oral QHS Cox, Amy N, DO   15 mg at 01/15/23 2145   traZODone (DESYREL) tablet 100 mg  100 mg Oral QHS Cox, Amy N, DO   100 mg at 01/15/23 2145     Discharge Medications: Please see discharge summary for a list of discharge medications.  TAKE these medications     amoxicillin 500 MG capsule Commonly known as: AMOXIL Take 1 capsule (500 mg total) by mouth 3 (three) times daily for 5 days.    cetirizine 10 MG tablet Commonly known as: ZYRTEC Take 10 mg by mouth daily.    diclofenac Sodium 1 % Gel Commonly known as: VOLTAREN Apply 4 g topically 3 (three) times daily as needed (pain). (Apply to affected area of legs)    donepezil 5 MG tablet Commonly known as: ARICEPT Take 5 mg by mouth at bedtime.    DULoxetine 20 MG capsule Commonly known as: CYMBALTA TAKE 2 CAPSULES(40 MG) BY MOUTH DAILY FOR ANXIETY    lithium carbonate 150 MG capsule Take 150 mg by mouth daily.    memantine 5 MG tablet Commonly known as: NAMENDA Take 2 tablets (10 mg total) by mouth daily. For memory    mirtazapine 15 MG tablet Commonly known as: REMERON Take 15 mg by mouth at bedtime.    ondansetron 4 MG disintegrating tablet Commonly known as: ZOFRAN-ODT Take 1 tablet (4 mg total) by mouth every 8 (eight) hours as needed for nausea or vomiting.    oxybutynin 15 MG 24 hr tablet Commonly known as: DITROPAN XL TAKE 1 TABLET(15 MG) BY MOUTH AT BEDTIME FOR OVERACTIVE BLADDER    traZODone 100 MG tablet Commonly known as: DESYREL Take 100 mg by mouth at bedtime.             Relevant Imaging Results:  Relevant Lab Results:   Additional Information SS# [id number]  Allena Katz, LCSW

## 2023-01-16 NOTE — Evaluation (Signed)
Physical Therapy Evaluation Patient Details Name: ANUSKA ELSER MRN: [id number] DOB: 1945-11-21 Today's Date: 01/16/2023  History of Present Illness  Pt is 77 y/o admitted 01/13/23 for altered mental status. PMH includes anxiety, appendicitis, c-diff, dementia.  Clinical Impression  Pt received in bathroom with RN and agreed to PT session. Pt performed STS with the use of RW (2wheels) CGA and amb ~11ft with RW (2wheels) SUP-CGA prior to ending session in recliner. VC necessary throughout session for RW management. Pt presented with altered mental status in which pt was only oriented to their own name. VC were necessary on a consistent basis as pt presented with short term memory deficits. Short term memory deficits have challenged pt's safety awareness with amb while using an AD. Unable to gather appropriate Hx information due to altered mental status. Pt tolerated Tx well and will continue to benefit from skilled PT sessions to improve functional mobility and activity tolerance to maximize safety/IND following D/C.        If plan is discharge home, recommend the following: A little help with walking and/or transfers;Help with stairs or ramp for entrance;Assist for transportation;Supervision due to cognitive status   Can travel by private vehicle   No    Equipment Recommendations Other (comment) (TBD at next facility)  Recommendations for Other Services       Functional Status Assessment Patient has had a recent decline in their functional status and demonstrates the ability to make significant improvements in function in a reasonable and predictable amount of time.     Precautions / Restrictions Precautions Precautions: Fall Restrictions Weight Bearing Restrictions Per Provider Order: No      Mobility  Bed Mobility               General bed mobility comments: Pt received in bathroom, bed mobility not observed    Transfers Overall transfer level: Needs  assistance Equipment used: Rolling walker (2 wheels) Transfers: Sit to/from Stand, Bed to chair/wheelchair/BSC Sit to Stand: Contact guard assist           General transfer comment: Pt performed STS with the use of RW (2wheels) and did not report any s/sx relative to dizziness while standing    Ambulation/Gait Ambulation/Gait assistance: Supervision, Contact guard assist Gait Distance (Feet): 70 Feet Assistive device: Rolling walker (2 wheels) Gait Pattern/deviations: Step-to pattern Gait velocity: decreased     General Gait Details: Pt amb in room with the use of RW (2wheels) SUP-CGA. VC necessary for RW management  Stairs            Wheelchair Mobility     Tilt Bed    Modified Rankin (Stroke Patients Only)       Balance Overall balance assessment: Needs assistance Sitting-balance support: Feet supported Sitting balance-Leahy Scale: Fair     Standing balance support: Bilateral upper extremity supported, During functional activity Standing balance-Leahy Scale: Fair                               Pertinent Vitals/Pain Pain Assessment Pain Assessment: No/denies pain    Home Living Family/patient expects to be discharged to:: Assisted living                 Home Equipment: Other (comment) (TBD) Additional Comments: Due to altered mental status, unable to determine home living information.    Prior Function Prior Level of Function : Patient poor historian/Family not available  Extremity/Trunk Assessment   Upper Extremity Assessment Upper Extremity Assessment: Overall WFL for tasks assessed    Lower Extremity Assessment Lower Extremity Assessment: Generalized weakness       Communication   Communication Communication: Difficulty communicating thoughts/reduced clarity of speech;Difficulty following commands/understanding Following commands: Follows one step commands inconsistently Cueing Techniques:  Verbal cues  Cognition Arousal: Alert Behavior During Therapy: Impulsive Overall Cognitive Status: History of cognitive impairments - at baseline                                 General Comments: Pt pleasant and willing to participate in PT session however presents with heavy altered mental status. Pt responds back when spoken to, however the information does not make sense of is drastically off topic.        General Comments      Exercises     Assessment/Plan    PT Assessment Patient needs continued PT services  PT Problem List Decreased strength;Decreased activity tolerance;Decreased balance;Decreased mobility;Decreased cognition;Decreased knowledge of use of DME;Decreased safety awareness;Decreased knowledge of precautions       PT Treatment Interventions DME instruction;Gait training;Functional mobility training;Therapeutic activities;Therapeutic exercise;Balance training;Patient/family education;Cognitive remediation    PT Goals (Current goals can be found in the Care Plan section)  Acute Rehab PT Goals Patient Stated Goal: No goals stated PT Goal Formulation: Patient unable to participate in goal setting Time For Goal Achievement: 01/30/23 Potential to Achieve Goals: Good    Frequency Min 1X/week     Co-evaluation               AM-PAC PT "6 Clicks" Mobility  Outcome Measure Help needed turning from your back to your side while in a flat bed without using bedrails?: A Little Help needed moving from lying on your back to sitting on the side of a flat bed without using bedrails?: A Little Help needed moving to and from a bed to a chair (including a wheelchair)?: A Little Help needed standing up from a chair using your arms (e.g., wheelchair or bedside chair)?: A Little Help needed to walk in hospital room?: A Little Help needed climbing 3-5 steps with a railing? : A Lot 6 Click Score: 17    End of Session Equipment Utilized During Treatment:  Gait belt Activity Tolerance: Patient tolerated treatment well Patient left: in chair;with call bell/phone within reach;with chair alarm set Nurse Communication: Mobility status PT Visit Diagnosis: Other abnormalities of gait and mobility (R26.89);Muscle weakness (generalized) (M62.81);History of falling (Z91.81)    Time: 1610-9604 PT Time Calculation (min) (ACUTE ONLY): 21 min   Charges:   PT Evaluation $PT Eval Low Complexity: 1 Low PT Treatments $Gait Training: 8-22 mins PT General Charges $$ ACUTE PT VISIT: 1 Visit         Laporcha Marchesi Sauvignon Howard SPT, LAT, ATC  Jaidev Sanger Sauvignon-Howard 01/16/2023, 1:20 PM

## 2023-01-16 NOTE — Progress Notes (Signed)
Report called to Suzette Battiest, Med Tech at [employer].

## 2023-01-16 NOTE — TOC Transition Note (Signed)
Transition of Care [facility]) - Discharge Note   Patient Details  Name: Lindsay Snyder MRN: [id number] Date of Birth: 1945-04-17  Transition of [facility]) CM/SW Contact:  Allena Katz, LCSW Phone Number: 01/16/2023, 10:53 AM   Clinical Narrative:   Pt to discharge back to [facility] care. Daughter will transport around 2. Sarah at [facility] has confirmed that pt is able to come back today. RN given number for report. Sarah states FL2 and dc summary can be sent with daughter.    Final next level of care: Memory Care Barriers to Discharge: Barriers Resolved   Patient Goals and CMS Choice Patient states their goals for this hospitalization and ongoing recovery are:: return to [facility]          Discharge Placement                Patient to be transferred to facility by: daughter Name of family member notified: daughter lacreita Patient and family notified of of transfer: 01/16/23  Discharge Plan and Services Additional resources added to the After Visit Summary for                                       Social Drivers of Health (SDOH) Interventions SDOH Screenings   Food Insecurity: No Food Insecurity (01/14/2023)  Housing: Unknown (01/14/2023)  Transportation Needs: No Transportation Needs (01/14/2023)  Utilities: Not At Risk (01/14/2023)  Alcohol Screen: Low Risk  (05/17/2022)  Depression (PHQ2-9): Low Risk  (05/17/2022)  Recent Concern: Depression (PHQ2-9) - High Risk (03/01/2022)  Financial Resource Strain: Low Risk  (09/04/2022)  Physical Activity: Inactive (09/04/2022)  Social Connections: Moderately Integrated (09/04/2022)  Stress: No Stress Concern Present (09/04/2022)  Tobacco Use: Low Risk  (01/13/2023)     Readmission Risk Interventions     No data to display

## 2023-01-16 NOTE — Consult Note (Addendum)
[facility] Liaison Consult Note    01/16/2023  [name] 12-29-1945 [id number]   *[facility] [[facility]] remote coverage for Lindsay Snyder, [facility] Liaison review for patient admitted to [name] due to 30 day readmission   [facility] Patient: previously active with [facility]I team  Primary Care Provider:  Doreene Nest, NP, with [name] provider is listed to provide the community transition of care follow up and [name] calls  Insurance: [facility]  Patient has had care coordination services.  Patient has been engaged by Child psychotherapist.  The community based plan of care has focused on disease management and community resource support.    Plan: Patient is currently to receive post Snyder care at a Memory Care back to [name] and reviewed palliative care notes for goals of care needs.    Of note, Lindsay Snyder services does not replace or interfere with any services that are needed or arranged by inpatient [name] care management team.   [name], RN, BSN, CCM [name]  [name], Population Health, [facility] Liaison Direct Dial: [phone] or secure chat Email: [name]

## 2023-01-16 NOTE — Progress Notes (Addendum)
Daily Progress Note   Patient Name: Lindsay Snyder       Date: 01/16/2023 DOB: [birth year]  Age: 77 y.o. MRN#: [id number] Attending Physician: Kathrynn Running, MD Primary Care Physician: Doreene Nest, NP Admit Date: 01/13/2023  Reason for Consultation/Follow-up: Establishing goals of care  Subjective: Notes and labs reviewed.  Into see patient.  Due to dementia patient is unable to have goals of care conversation for herself.  Patient's daughter [name] and son Pennie Rushing are at bedside.  They have son Christiane Ha on the phone via FaceTime.  Daughter has advanced directive showing Christiane Ha as primary H POA.  A copy was made and placed in patient's chart.  They discussed that she has had significant cognitive decline over the past year.  They discussed previous conversations that acceptable quality of life is very important to patient.  They advise some questions regarding goals of care have already been posed to them in regards to their mother's status.  We discussed her diagnoses, prognosis, GOC, EOL wishes disposition and options.  Created space and opportunity for patient  to explore thoughts and feelings regarding current medical information.   A detailed discussion was had today regarding advanced directives.  Concepts specific to code status, artifical feeding and hydration, IV antibiotics and rehospitalization were discussed.  The difference between an aggressive medical intervention path and a comfort care path was discussed.  Values and goals of care important to patient and family were attempted to be elicited.  Discussed limitations of medical interventions to prolong quality of life in some situations and discussed the concept of human mortality.  They discussed her impending  transition to memory care.  They discussed concern for how she will adapt to this change. Patient's 3 children discussed that patient does not want blood products.  They further discussed that their mother would not want CPR or resuscitative efforts.  They states she would not want to be placed on ventilator support, and would not want a feeding tube.    They are unsure about hospice level care or their feelings on continuing antibiotics in the future.  Son Christiane Ha states that he will be arriving into the area in the next few days where the family can talk further in person.  They are aware that they can request to speak with a hospice  liaison at any point to discuss care moving forward.  I completed a MOST form today electronically through [employer] with patient's son Christiane Ha who is H POA.  His sister and brother Pennie Rushing and [name] were present during the process. Each section of options on the form were reviewed in full detail and any questions were answered as needed. The patient's children/H POA outlined their wishes for the following treatment decisions:  Cardiopulmonary Resuscitation: Do Not Attempt Resuscitation (DNR/No CPR)  Medical Interventions: Limited Additional Interventions: Use medical treatment, IV fluids and cardiac monitoring as indicated, DO NOT USE intubation or mechanical ventilation. May consider use of less invasive airway support such as BiPAP or CPAP. Also provide comfort measures. Transfer to the hospital if indicated. Avoid intensive care.   Antibiotics: Determine use of limitation of antibiotics when infection occurs  IV Fluids: IV fluids for a defined trial period  Feeding Tube: No feeding tube       Length of Stay: 3  Current Medications: Scheduled Meds:   amoxicillin  500 mg Oral Q8H   donepezil  5 mg Oral QHS   DULoxetine  40 mg Oral Daily   enoxaparin (LOVENOX) injection  0.5 mg/kg Subcutaneous Q24H   feeding supplement  237 mL Oral BID BM   lithium carbonate   150 mg Oral Daily   memantine  10 mg Oral Daily   mirtazapine  15 mg Oral QHS   oxybutynin  15 mg Oral QHS   traZODone  100 mg Oral QHS    Continuous Infusions:   PRN Meds: ondansetron **OR** ondansetron (ZOFRAN) IV  Physical Exam Pulmonary:     Effort: Pulmonary effort is normal.  Skin:    General: Skin is warm and dry.  Neurological:     Mental Status: She is alert.             Vital Signs: BP 127/75 (BP Location: Right Arm)   Pulse 72   Temp 98 F (36.7 C)   Resp 16   Ht 5\' 2"  (1.575 m)   Wt 106.7 kg   SpO2 94%   BMI 43.02 kg/m  SpO2: SpO2: 94 % O2 Device: O2 Device: Room Air O2 Flow Rate: O2 Flow Rate (L/min): 2 L/min  Intake/output summary: No intake or output data in the 24 hours ending 01/16/23 1132 LBM: Last BM Date : 01/16/23 Baseline Weight: Weight: 106.7 kg Most recent weight: Weight: 106.7 kg   Patient Active Problem List   Diagnosis Date Noted   Goals of care, counseling/discussion 01/15/2023   Altered mental status 01/13/2023   History of rhabdomyolysis 01/13/2023   Acute encephalopathy 01/01/2023   UTI (urinary tract infection) 01/01/2023   Encephalopathy 01/01/2023   Lethargy 09/06/2022   Cancer (HCC) 05/02/2022   Vaginal itching 03/01/2022   AKI (acute kidney injury) (HCC) 02/05/2022   Dementia (HCC) 02/05/2022   Mood disorder (HCC) 02/05/2022   Overactive bladder 02/05/2022   Persistent cough for 3 weeks or longer 06/29/2021   Recurrent falls 06/29/2021   Osteoarthritis of hands, 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   Lower abdominal pain 02/03/2021   Urinary incontinence 02/03/2021   Memory changes 07/05/2020   Gastroesophageal reflux disease 07/05/2020   Generalized abdominal pain 03/01/2020   Environmental and seasonal allergies 01/14/2020   GAD (generalized anxiety disorder) 01/14/2020   Pain of left upper extremity 05/26/2019   Oral lesion 02/26/2019   Hemorrhoids 07/11/2017   Preventative health care 07/11/2017   Hyperlipidemia  07/10/2016   Insomnia due to stress 09/30/2014  Medicare annual wellness visit, subsequent 05/17/2014   Murmur, cardiac 05/17/2014   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   Rash and nonspecific skin eruption 04/15/2014   Osteopenia 04/15/2014   Obesity 04/15/2014    Palliative Care Assessment & Plan    Recommendations/Plan: DNR/DNI.  No feeding tube. Family is aware that they can request to speak with hospice liaison at any time.  Code Status:    Code Status Orders  (From admission, onward)           Start     Ordered   01/16/23 1132  Do not attempt resuscitation (DNR)- Limited -Do Not Intubate (DNI)  (Code Status)  Continuous       Question Answer Comment  If pulseless and not breathing No CPR or chest compressions.   In Pre-Arrest Conditions (Patient Is Breathing and Has A Pulse) Do not intubate. Provide all appropriate non-invasive medical interventions. Avoid ICU transfer unless indicated or required.   Consent: Discussion documented in EHR or advanced directives reviewed      01/16/23 1131           Code Status History     Date Active Date Inactive Code Status Order ID Comments User Context   01/13/2023 1539 01/16/2023 1131 Full Code [id number]  Lovenia Kim, DO ED   01/01/2023 0856 01/05/2023 1941 Full Code [id number]  Emeline General, MD ED   02/05/2022 0202 02/05/2022 1648 Full Code [id number]  John Giovanni, MD ED       Prognosis: Poor overall    Care plan was discussed with attending at bedside  Thank you for allowing the Palliative Medicine Team to assist in the care of this patient.    Morton Stall, NP  Please contact Palliative Medicine Team phone at [phone] for questions and concerns.

## 2023-01-17 ENCOUNTER — Ambulatory Visit: Payer: Medicare HMO | Admitting: Podiatry

## 2023-01-17 DIAGNOSIS — G300 Alzheimer's disease with early onset: Secondary | ICD-10-CM | POA: Diagnosis not present

## 2023-01-17 DIAGNOSIS — F5105 Insomnia due to other mental disorder: Secondary | ICD-10-CM | POA: Diagnosis not present

## 2023-01-17 DIAGNOSIS — F02B18 Dementia in other diseases classified elsewhere, moderate, with other behavioral disturbance: Secondary | ICD-10-CM | POA: Diagnosis not present

## 2023-01-17 DIAGNOSIS — F331 Major depressive disorder, recurrent, moderate: Secondary | ICD-10-CM | POA: Diagnosis not present

## 2023-01-18 ENCOUNTER — Encounter: Payer: Medicare HMO | Admitting: *Deleted

## 2023-01-18 DIAGNOSIS — G300 Alzheimer's disease with early onset: Secondary | ICD-10-CM | POA: Diagnosis not present

## 2023-01-18 DIAGNOSIS — N1831 Chronic kidney disease, stage 3a: Secondary | ICD-10-CM | POA: Diagnosis not present

## 2023-01-18 LAB — CULTURE, BLOOD (ROUTINE X 2)
Culture: NO GROWTH
Culture: NO GROWTH

## 2023-02-05 DIAGNOSIS — E663 Overweight: Secondary | ICD-10-CM | POA: Diagnosis not present

## 2023-02-05 DIAGNOSIS — J302 Other seasonal allergic rhinitis: Secondary | ICD-10-CM | POA: Diagnosis not present

## 2023-02-05 DIAGNOSIS — G300 Alzheimer's disease with early onset: Secondary | ICD-10-CM | POA: Diagnosis not present

## 2023-02-05 DIAGNOSIS — F02B18 Dementia in other diseases classified elsewhere, moderate, with other behavioral disturbance: Secondary | ICD-10-CM | POA: Diagnosis not present

## 2023-02-05 DIAGNOSIS — N1831 Chronic kidney disease, stage 3a: Secondary | ICD-10-CM | POA: Diagnosis not present

## 2023-02-05 DIAGNOSIS — Z79899 Other long term (current) drug therapy: Secondary | ICD-10-CM | POA: Diagnosis not present

## 2023-02-12 DIAGNOSIS — G300 Alzheimer's disease with early onset: Secondary | ICD-10-CM | POA: Diagnosis not present

## 2023-02-12 DIAGNOSIS — B372 Candidiasis of skin and nail: Secondary | ICD-10-CM | POA: Diagnosis not present

## 2023-02-12 DIAGNOSIS — R2681 Unsteadiness on feet: Secondary | ICD-10-CM | POA: Diagnosis not present

## 2023-02-12 DIAGNOSIS — Z9181 History of falling: Secondary | ICD-10-CM | POA: Diagnosis not present

## 2023-02-12 DIAGNOSIS — Z741 Need for assistance with personal care: Secondary | ICD-10-CM | POA: Diagnosis not present

## 2023-02-12 DIAGNOSIS — F02B18 Dementia in other diseases classified elsewhere, moderate, with other behavioral disturbance: Secondary | ICD-10-CM | POA: Diagnosis not present

## 2023-02-19 DIAGNOSIS — N390 Urinary tract infection, site not specified: Secondary | ICD-10-CM | POA: Diagnosis not present

## 2023-02-19 DIAGNOSIS — W19XXXA Unspecified fall, initial encounter: Secondary | ICD-10-CM | POA: Diagnosis not present

## 2023-02-19 DIAGNOSIS — F02B18 Dementia in other diseases classified elsewhere, moderate, with other behavioral disturbance: Secondary | ICD-10-CM | POA: Diagnosis not present

## 2023-02-19 DIAGNOSIS — G300 Alzheimer's disease with early onset: Secondary | ICD-10-CM | POA: Diagnosis not present

## 2023-02-19 DIAGNOSIS — R2681 Unsteadiness on feet: Secondary | ICD-10-CM | POA: Diagnosis not present

## 2023-02-20 ENCOUNTER — Emergency Department: Payer: Medicare HMO

## 2023-02-20 ENCOUNTER — Inpatient Hospital Stay: Payer: Medicare HMO

## 2023-02-20 ENCOUNTER — Inpatient Hospital Stay
Admission: EM | Admit: 2023-02-20 | Discharge: 2023-02-26 | DRG: 291 | Disposition: A | Discharge status: Hospice | Payer: Medicare HMO | Source: Skilled Nursing Facility | Attending: Internal Medicine | Admitting: Internal Medicine

## 2023-02-20 ENCOUNTER — Other Ambulatory Visit: Payer: Self-pay

## 2023-02-20 DIAGNOSIS — F05 Delirium due to known physiological condition: Secondary | ICD-10-CM | POA: Diagnosis present

## 2023-02-20 DIAGNOSIS — Z66 Do not resuscitate: Secondary | ICD-10-CM | POA: Diagnosis present

## 2023-02-20 DIAGNOSIS — L03115 Cellulitis of right lower limb: Secondary | ICD-10-CM | POA: Diagnosis not present

## 2023-02-20 DIAGNOSIS — I5033 Acute on chronic diastolic (congestive) heart failure: Principal | ICD-10-CM | POA: Insufficient documentation

## 2023-02-20 DIAGNOSIS — Z6838 Body mass index (BMI) 38.0-38.9, adult: Secondary | ICD-10-CM

## 2023-02-20 DIAGNOSIS — L03116 Cellulitis of left lower limb: Secondary | ICD-10-CM | POA: Diagnosis not present

## 2023-02-20 DIAGNOSIS — Z79899 Other long term (current) drug therapy: Secondary | ICD-10-CM

## 2023-02-20 DIAGNOSIS — I700 Atherosclerosis of aorta: Secondary | ICD-10-CM | POA: Diagnosis not present

## 2023-02-20 DIAGNOSIS — I509 Heart failure, unspecified: Secondary | ICD-10-CM | POA: Diagnosis not present

## 2023-02-20 DIAGNOSIS — E669 Obesity, unspecified: Secondary | ICD-10-CM | POA: Diagnosis present

## 2023-02-20 DIAGNOSIS — F03B4 Unspecified dementia, moderate, with anxiety: Secondary | ICD-10-CM | POA: Diagnosis not present

## 2023-02-20 DIAGNOSIS — J810 Acute pulmonary edema: Secondary | ICD-10-CM

## 2023-02-20 DIAGNOSIS — F0284 Dementia in other diseases classified elsewhere, unspecified severity, with anxiety: Secondary | ICD-10-CM | POA: Diagnosis present

## 2023-02-20 DIAGNOSIS — Z515 Encounter for palliative care: Secondary | ICD-10-CM | POA: Diagnosis not present

## 2023-02-20 DIAGNOSIS — R1310 Dysphagia, unspecified: Secondary | ICD-10-CM | POA: Diagnosis not present

## 2023-02-20 DIAGNOSIS — N390 Urinary tract infection, site not specified: Secondary | ICD-10-CM | POA: Diagnosis not present

## 2023-02-20 DIAGNOSIS — F0283 Dementia in other diseases classified elsewhere, unspecified severity, with mood disturbance: Secondary | ICD-10-CM | POA: Diagnosis present

## 2023-02-20 DIAGNOSIS — Z90710 Acquired absence of both cervix and uterus: Secondary | ICD-10-CM

## 2023-02-20 DIAGNOSIS — R296 Repeated falls: Secondary | ICD-10-CM | POA: Diagnosis not present

## 2023-02-20 DIAGNOSIS — Z886 Allergy status to analgesic agent status: Secondary | ICD-10-CM

## 2023-02-20 DIAGNOSIS — Z043 Encounter for examination and observation following other accident: Secondary | ICD-10-CM | POA: Diagnosis not present

## 2023-02-20 DIAGNOSIS — Z87440 Personal history of urinary (tract) infections: Secondary | ICD-10-CM

## 2023-02-20 DIAGNOSIS — Z1621 Resistance to vancomycin: Secondary | ICD-10-CM | POA: Diagnosis present

## 2023-02-20 DIAGNOSIS — G9349 Other encephalopathy: Secondary | ICD-10-CM | POA: Diagnosis not present

## 2023-02-20 DIAGNOSIS — N179 Acute kidney failure, unspecified: Secondary | ICD-10-CM | POA: Diagnosis not present

## 2023-02-20 DIAGNOSIS — W06XXXA Fall from bed, initial encounter: Secondary | ICD-10-CM | POA: Diagnosis present

## 2023-02-20 DIAGNOSIS — T502X5A Adverse effect of carbonic-anhydrase inhibitors, benzothiadiazides and other diuretics, initial encounter: Secondary | ICD-10-CM | POA: Diagnosis not present

## 2023-02-20 DIAGNOSIS — M1711 Unilateral primary osteoarthritis, right knee: Secondary | ICD-10-CM | POA: Diagnosis not present

## 2023-02-20 DIAGNOSIS — J9601 Acute respiratory failure with hypoxia: Secondary | ICD-10-CM

## 2023-02-20 DIAGNOSIS — Z800 Family history of malignant neoplasm of digestive organs: Secondary | ICD-10-CM

## 2023-02-20 DIAGNOSIS — F32A Depression, unspecified: Secondary | ICD-10-CM | POA: Diagnosis present

## 2023-02-20 DIAGNOSIS — K219 Gastro-esophageal reflux disease without esophagitis: Secondary | ICD-10-CM | POA: Diagnosis present

## 2023-02-20 DIAGNOSIS — J9621 Acute and chronic respiratory failure with hypoxia: Secondary | ICD-10-CM | POA: Diagnosis present

## 2023-02-20 DIAGNOSIS — H6691 Otitis media, unspecified, right ear: Secondary | ICD-10-CM | POA: Diagnosis present

## 2023-02-20 DIAGNOSIS — G309 Alzheimer's disease, unspecified: Secondary | ICD-10-CM | POA: Diagnosis present

## 2023-02-20 DIAGNOSIS — M25561 Pain in right knee: Secondary | ICD-10-CM | POA: Diagnosis not present

## 2023-02-20 DIAGNOSIS — N3281 Overactive bladder: Secondary | ICD-10-CM | POA: Diagnosis present

## 2023-02-20 DIAGNOSIS — G9340 Encephalopathy, unspecified: Secondary | ICD-10-CM | POA: Diagnosis not present

## 2023-02-20 DIAGNOSIS — J189 Pneumonia, unspecified organism: Secondary | ICD-10-CM | POA: Diagnosis not present

## 2023-02-20 DIAGNOSIS — F0390 Unspecified dementia without behavioral disturbance: Secondary | ICD-10-CM | POA: Diagnosis present

## 2023-02-20 DIAGNOSIS — W19XXXA Unspecified fall, initial encounter: Secondary | ICD-10-CM | POA: Diagnosis not present

## 2023-02-20 DIAGNOSIS — Z7409 Other reduced mobility: Secondary | ICD-10-CM | POA: Diagnosis present

## 2023-02-20 DIAGNOSIS — Z8619 Personal history of other infectious and parasitic diseases: Secondary | ICD-10-CM

## 2023-02-20 DIAGNOSIS — R4182 Altered mental status, unspecified: Secondary | ICD-10-CM | POA: Diagnosis present

## 2023-02-20 DIAGNOSIS — Z8249 Family history of ischemic heart disease and other diseases of the circulatory system: Secondary | ICD-10-CM

## 2023-02-20 DIAGNOSIS — Z833 Family history of diabetes mellitus: Secondary | ICD-10-CM

## 2023-02-20 DIAGNOSIS — J168 Pneumonia due to other specified infectious organisms: Secondary | ICD-10-CM | POA: Diagnosis not present

## 2023-02-20 DIAGNOSIS — B952 Enterococcus as the cause of diseases classified elsewhere: Secondary | ICD-10-CM | POA: Diagnosis not present

## 2023-02-20 DIAGNOSIS — Z9049 Acquired absence of other specified parts of digestive tract: Secondary | ICD-10-CM

## 2023-02-20 DIAGNOSIS — Z8719 Personal history of other diseases of the digestive system: Secondary | ICD-10-CM

## 2023-02-20 DIAGNOSIS — Z9889 Other specified postprocedural states: Secondary | ICD-10-CM

## 2023-02-20 DIAGNOSIS — Y92122 Bedroom in nursing home as the place of occurrence of the external cause: Secondary | ICD-10-CM | POA: Diagnosis not present

## 2023-02-20 DIAGNOSIS — M25461 Effusion, right knee: Secondary | ICD-10-CM | POA: Diagnosis not present

## 2023-02-20 DIAGNOSIS — R918 Other nonspecific abnormal finding of lung field: Secondary | ICD-10-CM | POA: Diagnosis not present

## 2023-02-20 DIAGNOSIS — Z888 Allergy status to other drugs, medicaments and biological substances status: Secondary | ICD-10-CM

## 2023-02-20 DIAGNOSIS — Z860100 Personal history of colon polyps, unspecified: Secondary | ICD-10-CM

## 2023-02-20 DIAGNOSIS — R0902 Hypoxemia: Secondary | ICD-10-CM | POA: Diagnosis not present

## 2023-02-20 DIAGNOSIS — Z803 Family history of malignant neoplasm of breast: Secondary | ICD-10-CM

## 2023-02-20 DIAGNOSIS — I517 Cardiomegaly: Secondary | ICD-10-CM | POA: Diagnosis not present

## 2023-02-20 LAB — COMPREHENSIVE METABOLIC PANEL
ALT: 23 U/L (ref 0–44)
AST: 57 U/L — ABNORMAL HIGH (ref 15–41)
Albumin: 3.3 g/dL — ABNORMAL LOW (ref 3.5–5.0)
Alkaline Phosphatase: 56 U/L (ref 38–126)
Anion gap: 10 (ref 5–15)
BUN: 13 mg/dL (ref 8–23)
CO2: 23 mmol/L (ref 22–32)
Calcium: 8.6 mg/dL — ABNORMAL LOW (ref 8.9–10.3)
Chloride: 105 mmol/L (ref 98–111)
Creatinine, Ser: 1.12 mg/dL — ABNORMAL HIGH (ref 0.44–1.00)
GFR, Estimated: 50 mL/min — ABNORMAL LOW (ref >60)
Glucose, Bld: 106 mg/dL — ABNORMAL HIGH (ref 70–99)
Potassium: 3.7 mmol/L (ref 3.5–5.1)
Sodium: 138 mmol/L (ref 135–145)
Total Bilirubin: 1 mg/dL (ref 0.0–1.2)
Total Protein: 6.1 g/dL — ABNORMAL LOW (ref 6.5–8.1)

## 2023-02-20 LAB — URINALYSIS, W/ REFLEX TO CULTURE (INFECTION SUSPECTED)
Bilirubin Urine: NEGATIVE
Glucose, UA: NEGATIVE mg/dL
Hgb urine dipstick: NEGATIVE
Ketones, ur: NEGATIVE mg/dL
Leukocytes,Ua: NEGATIVE
Nitrite: NEGATIVE
Protein, ur: NEGATIVE mg/dL
Specific Gravity, Urine: 1.014 (ref 1.005–1.030)
pH: 6 (ref 5.0–8.0)

## 2023-02-20 LAB — CBC WITH DIFFERENTIAL/PLATELET
Abs Immature Granulocytes: 0.07 10*3/uL (ref 0.00–0.07)
Basophils Absolute: 0 10*3/uL (ref 0.0–0.1)
Basophils Relative: 0 %
Eosinophils Absolute: 0.1 10*3/uL (ref 0.0–0.5)
Eosinophils Relative: 1 %
HCT: 37.6 % (ref 36.0–46.0)
Hemoglobin: 12.2 g/dL (ref 12.0–15.0)
Immature Granulocytes: 1 %
Lymphocytes Relative: 9 %
Lymphs Abs: 0.9 10*3/uL (ref 0.7–4.0)
MCH: 28.9 pg (ref 26.0–34.0)
MCHC: 32.4 g/dL (ref 30.0–36.0)
MCV: 89.1 fL (ref 80.0–100.0)
Monocytes Absolute: 0.7 10*3/uL (ref 0.1–1.0)
Monocytes Relative: 7 %
Neutro Abs: 7.9 10*3/uL — ABNORMAL HIGH (ref 1.7–7.7)
Neutrophils Relative %: 82 %
Platelets: 267 10*3/uL (ref 150–400)
RBC: 4.22 MIL/uL (ref 3.87–5.11)
RDW: 13.4 % (ref 11.5–15.5)
WBC: 9.6 10*3/uL (ref 4.0–10.5)
nRBC: 0 % (ref 0.0–0.2)

## 2023-02-20 LAB — APTT: aPTT: 28 s (ref 24–36)

## 2023-02-20 LAB — RESP PANEL BY RT-PCR (RSV, FLU A&B, COVID)  RVPGX2
Influenza A by PCR: NEGATIVE
Influenza B by PCR: NEGATIVE
Resp Syncytial Virus by PCR: NEGATIVE
SARS Coronavirus 2 by RT PCR: NEGATIVE

## 2023-02-20 LAB — PROTIME-INR
INR: 1.1 (ref 0.8–1.2)
Prothrombin Time: 14 s (ref 11.4–15.2)

## 2023-02-20 LAB — CBG MONITORING, ED: Glucose-Capillary: 96 mg/dL (ref 70–99)

## 2023-02-20 LAB — PROCALCITONIN: Procalcitonin: <0.1 ng/mL

## 2023-02-20 LAB — CK: Total CK: 1463 U/L — ABNORMAL HIGH (ref 38–234)

## 2023-02-20 LAB — LITHIUM LEVEL: Lithium Lvl: 0.12 mmol/L — ABNORMAL LOW (ref 0.60–1.20)

## 2023-02-20 LAB — BRAIN NATRIURETIC PEPTIDE: B Natriuretic Peptide: 147.7 pg/mL — ABNORMAL HIGH (ref 0.0–100.0)

## 2023-02-20 LAB — LACTIC ACID, PLASMA
Lactic Acid, Venous: 1.5 mmol/L (ref 0.5–1.9)
Lactic Acid, Venous: 1.2 mmol/L (ref 0.5–1.9)

## 2023-02-20 MED ORDER — METRONIDAZOLE 500 MG/100ML IV SOLN
500.0000 mg | Freq: Once | INTRAVENOUS | Status: AC
  Administered 2023-02-20: 500 mg via INTRAVENOUS
  Filled 2023-02-20: qty 100

## 2023-02-20 MED ORDER — VANCOMYCIN HCL IN DEXTROSE 1-5 GM/200ML-% IV SOLN
1000.0000 mg | Freq: Once | INTRAVENOUS | Status: AC
  Administered 2023-02-20: 1000 mg via INTRAVENOUS
  Filled 2023-02-20: qty 200

## 2023-02-20 MED ORDER — SODIUM CHLORIDE 0.9% FLUSH
3.0000 mL | Freq: Two times a day (BID) | INTRAVENOUS | Status: DC
  Administered 2023-02-20 – 2023-02-22 (×6): 3 mL via INTRAVENOUS

## 2023-02-20 MED ORDER — LORATADINE 10 MG PO TABS
10.0000 mg | ORAL_TABLET | Freq: Every day | ORAL | Status: DC
Start: 2023-02-21 — End: 2023-02-27
  Administered 2023-02-21 – 2023-02-26 (×6): 10 mg via ORAL
  Filled 2023-02-20 (×6): qty 1

## 2023-02-20 MED ORDER — ENOXAPARIN SODIUM 40 MG/0.4ML IJ SOSY
40.0000 mg | PREFILLED_SYRINGE | INTRAMUSCULAR | Status: DC
  Administered 2023-02-20 – 2023-02-25 (×6): 40 mg via SUBCUTANEOUS
  Filled 2023-02-20 (×6): qty 0.4

## 2023-02-20 MED ORDER — FUROSEMIDE 10 MG/ML IJ SOLN
40.0000 mg | Freq: Once | INTRAMUSCULAR | Status: AC
  Administered 2023-02-20: 40 mg via INTRAVENOUS
  Filled 2023-02-20: qty 4

## 2023-02-20 MED ORDER — SODIUM CHLORIDE 0.9 % IV SOLN: 500.0000 mg | INTRAVENOUS | Status: DC

## 2023-02-20 MED ORDER — FUROSEMIDE 10 MG/ML IJ SOLN
40.0000 mg | Freq: Two times a day (BID) | INTRAMUSCULAR | Status: DC
  Administered 2023-02-20 – 2023-02-21 (×2): 40 mg via INTRAVENOUS
  Filled 2023-02-20 (×2): qty 4

## 2023-02-20 MED ORDER — SODIUM CHLORIDE 0.9 % IV SOLN: 2.0000 g | INTRAVENOUS | Status: DC

## 2023-02-20 MED ORDER — LITHIUM CARBONATE 150 MG PO CAPS
150.0000 mg | ORAL_CAPSULE | Freq: Every day | ORAL | Status: DC
  Administered 2023-02-20 – 2023-02-26 (×7): 150 mg via ORAL
  Filled 2023-02-20 (×7): qty 1

## 2023-02-20 MED ORDER — SODIUM CHLORIDE 0.9% FLUSH: 3.0000 mL | INTRAVENOUS | Status: DC | PRN

## 2023-02-20 MED ORDER — TRAZODONE HCL 100 MG PO TABS
100.0000 mg | ORAL_TABLET | Freq: Every day | ORAL | Status: DC
  Administered 2023-02-20 – 2023-02-25 (×6): 100 mg via ORAL
  Filled 2023-02-20 (×6): qty 1

## 2023-02-20 MED ORDER — MIRTAZAPINE 15 MG PO TABS
15.0000 mg | ORAL_TABLET | Freq: Every day | ORAL | Status: DC
Start: 2023-02-20 — End: 2023-02-27
  Administered 2023-02-20 – 2023-02-25 (×6): 15 mg via ORAL
  Filled 2023-02-20 (×6): qty 1

## 2023-02-20 MED ORDER — DICLOFENAC SODIUM 1 % EX GEL: 4.0000 g | Freq: Three times a day (TID) | CUTANEOUS | Status: DC | PRN

## 2023-02-20 MED ORDER — DONEPEZIL HCL 5 MG PO TABS
5.0000 mg | ORAL_TABLET | Freq: Every day | ORAL | Status: DC
Start: 2023-02-20 — End: 2023-02-27
  Administered 2023-02-21 – 2023-02-25 (×5): 5 mg via ORAL
  Filled 2023-02-20 (×6): qty 1

## 2023-02-20 MED ORDER — ONDANSETRON 4 MG PO TBDP
4.0000 mg | ORAL_TABLET | Freq: Three times a day (TID) | ORAL | Status: DC | PRN
  Administered 2023-02-23: 4 mg via ORAL
  Filled 2023-02-20: qty 1

## 2023-02-20 MED ORDER — ONDANSETRON HCL 4 MG/2ML IJ SOLN
4.0000 mg | Freq: Four times a day (QID) | INTRAMUSCULAR | Status: DC | PRN
  Filled 2023-02-20: qty 2

## 2023-02-20 MED ORDER — SODIUM CHLORIDE 0.9 % IV SOLN
2.0000 g | Freq: Once | INTRAVENOUS | Status: AC
  Administered 2023-02-20: 2 g via INTRAVENOUS
  Filled 2023-02-20: qty 12.5

## 2023-02-20 MED ORDER — SODIUM CHLORIDE 0.9 % IV SOLN
250.0000 mL | INTRAVENOUS | Status: AC | PRN
Start: 2023-02-20 — End: 2023-02-21

## 2023-02-20 MED ORDER — SODIUM CHLORIDE 0.9 % IV SOLN
100.0000 mg | Freq: Once | INTRAVENOUS | Status: AC
  Administered 2023-02-20: 100 mg via INTRAVENOUS
  Filled 2023-02-20: qty 100

## 2023-02-20 MED ORDER — OXYBUTYNIN CHLORIDE ER 5 MG PO TB24
15.0000 mg | ORAL_TABLET | Freq: Every day | ORAL | Status: DC
  Administered 2023-02-21 – 2023-02-25 (×4): 15 mg via ORAL
  Filled 2023-02-20 (×2): qty 1
  Filled 2023-02-20: qty 3
  Filled 2023-02-20 (×2): qty 1
  Filled 2023-02-20: qty 3
  Filled 2023-02-20: qty 1

## 2023-02-20 MED ORDER — DULOXETINE HCL 20 MG PO CPEP
20.0000 mg | ORAL_CAPSULE | Freq: Two times a day (BID) | ORAL | Status: DC
  Administered 2023-02-21 – 2023-02-25 (×9): 20 mg via ORAL
  Filled 2023-02-20 (×13): qty 1

## 2023-02-20 MED ORDER — MEMANTINE HCL 5 MG PO TABS
10.0000 mg | ORAL_TABLET | Freq: Every day | ORAL | Status: DC
  Administered 2023-02-20 – 2023-02-26 (×7): 10 mg via ORAL
  Filled 2023-02-20 (×7): qty 2

## 2023-02-20 MED ORDER — IPRATROPIUM-ALBUTEROL 0.5-2.5 (3) MG/3ML IN SOLN: 3.0000 mL | Freq: Four times a day (QID) | RESPIRATORY_TRACT | Status: DC | PRN

## 2023-02-20 NOTE — ED Notes (Signed)
Patient placed on pure wick °

## 2023-02-20 NOTE — Progress Notes (Signed)
Heart Failure Navigator Progress Note  Assessed for Heart & Vascular TOC clinic readiness.  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   Navigator will sign off at this time.  Roxy Horseman, RN, BSN [facility] Heart Failure Navigator Secure Chat Only

## 2023-02-20 NOTE — ED Provider Notes (Addendum)
[facility] Provider Note    Event Date/Time   First MD Initiated Contact with Patient 02/20/23 [phone]     (approximate)   History   Fall   HPI  Lindsay Snyder is a 78 y.o. female with history of dementia, recurrent UTIs, frequent falls who presents to the emergency department from [facility] with EMS after she had multiple unwitnessed falls out of the bed.  Patient is not able to provide any history.  Staff report she was started on Keflex 2 days ago for a UTI.  On exam she also appears to have bilateral lower extremity cellulitis.  Patient denies any pain.  Unclear if she is on any blood thinners.  No family at bedside.  HIPAA compliant voicemail left with patient's daughter.  EMS also reports patient was hypoxic, 88-89% on RA.  Does not wear O2 per EMS.   History provided by EMS, level 5 caveat secondary to dementia.    Past Medical History:  Diagnosis Date   Allergy 010150   Anxiety 08/30/2019   Appendicitis 8    C. difficile diarrhea 02/04/2022   Cancer [facility])    daughter states patient never had cancer   Dementia (HCC)    GERD (gastroesophageal reflux disease) 01/30/2019   Otitis externa 10/14/2020   Polyp of colon    Benign    Past Surgical History:  Procedure Laterality Date   ABDOMINAL HYSTERECTOMY     APPENDECTOMY     BREAST EXCISIONAL BIOPSY Right    + 20 years neg   COLONOSCOPY WITH PROPOFOL N/A 12/16/2017   Procedure: COLONOSCOPY WITH PROPOFOL;  Surgeon: Wyline Mood, MD;  Location: [facility] ENDOSCOPY;  Service: Gastroenterology;  Laterality: N/A;   COLONOSCOPY WITH PROPOFOL N/A 01/02/2021   Procedure: COLONOSCOPY WITH PROPOFOL;  Surgeon: Wyline Mood, MD;  Location: [facility] ENDOSCOPY;  Service: Gastroenterology;  Laterality: N/A;    MEDICATIONS:  Prior to Admission medications   Medication Sig Start Date End Date Taking? Authorizing Provider  cetirizine (ZYRTEC) 10 MG tablet Take 10 mg by mouth daily.    [provider]   diclofenac Sodium (VOLTAREN) 1 % GEL Apply 4 g topically 3 (three) times daily as needed (pain). (Apply to affected area of legs) 12/24/22   [provider]  donepezil (ARICEPT) 5 MG tablet Take 5 mg by mouth at bedtime. 09/29/20   [provider]  DULoxetine (CYMBALTA) 20 MG capsule TAKE 2 CAPSULES(40 MG) BY MOUTH DAILY FOR ANXIETY 08/26/22   Doreene Nest, NP  lithium carbonate 150 MG capsule Take 150 mg by mouth daily. 12/01/21   [provider]  memantine (NAMENDA) 5 MG tablet Take 2 tablets (10 mg total) by mouth daily. For memory 11/29/22   Doreene Nest, NP  mirtazapine (REMERON) 15 MG tablet Take 15 mg by mouth at bedtime.    [provider]  ondansetron (ZOFRAN-ODT) 4 MG disintegrating tablet Take 1 tablet (4 mg total) by mouth every 8 (eight) hours as needed for nausea or vomiting. 02/09/22   Doreene Nest, NP  oxybutynin (DITROPAN XL) 15 MG 24 hr tablet TAKE 1 TABLET(15 MG) BY MOUTH AT BEDTIME FOR OVERACTIVE BLADDER 01/07/23   Doreene Nest, NP  traZODone (DESYREL) 100 MG tablet Take 100 mg by mouth at bedtime. 12/01/21   [provider]    Physical Exam   Triage Vital Signs: ED Triage Vitals  Encounter Vitals Group     BP 02/20/23 0620 117/61  Systolic BP Percentile --      Diastolic BP Percentile --      Pulse Rate 02/20/23 0620 89     Resp 02/20/23 0620 15     Temp 02/20/23 0620 98.6 F (37 C)     Temp Source 02/20/23 0620 Rectal     SpO2 02/20/23 0611 94 %     Weight --      Height --      Head Circumference --      Peak Flow --      Pain Score --      Pain Loc --      Pain Education --      Exclude from Growth Chart --      Most recent vital signs: Vitals:   02/20/23 0620 02/20/23 0736  BP: 117/61 116/89  Pulse: 89 81  Resp: 15 20  Temp: 98.6 F (37 C)   SpO2: 96% 98%     CONSTITUTIONAL: Alert, elderly, feels warm to touch but rectal temperature is 98.6.  Able to answer her name.  Does not  answer other questions appropriately.  Intermittent gibberish. HEAD: Normocephalic; atraumatic EYES: Conjunctivae clear, PERRL, EOMI ENT: normal nose; no rhinorrhea; moist mucous membranes; pharynx without lesions noted; no dental injury; no septal hematoma, no epistaxis; no facial deformity or bony tenderness NECK: Supple, no midline spinal tenderness, step-off or deformity; trachea midline CARD: RRR; S1 and S2 appreciated; no murmurs, no clicks, no rubs, no gallops RESP: Normal chest excursion without splinting or tachypnea; breath sounds clear and equal bilaterally; no wheezes, no rhonchi, no rales; no hypoxia on [place] or respiratory distress CHEST:  chest wall stable, no crepitus or ecchymosis or deformity, nontender to palpation; no flail chest ABD/GI: Non-distended; soft, non-tender, no rebound, no guarding; no ecchymosis or other lesions noted PELVIS:  stable, nontender to palpation BACK:  The back appears normal; no midline spinal tenderness, step-off or deformity EXT: Patient has pitting edema in bilateral distal lower extremities with increased redness and warmth suggestive of cellulitis.  No abscess noted.  2+ DP pulses.  Compartments soft.  No bony abnormality or deformity noted. SKIN: Normal color for age and race; warm NEURO: No facial asymmetry, normal speech, moving all extremities equally  ED Results / Procedures / Treatments   LABS: (all labs ordered are listed, but only abnormal results are displayed) Labs Reviewed  COMPREHENSIVE METABOLIC PANEL - Abnormal; Notable for the following components:      Result Value   Glucose, Bld 106 (*)    Creatinine, Ser 1.12 (*)    Calcium 8.6 (*)    Total Protein 6.1 (*)    Albumin 3.3 (*)    AST 57 (*)    GFR, Estimated 50 (*)    All other components within normal limits  CBC WITH DIFFERENTIAL/PLATELET - Abnormal; Notable for the following components:   Neutro Abs 7.9 (*)    All other components within normal limits  URINALYSIS, W/  REFLEX TO CULTURE (INFECTION SUSPECTED) - Abnormal; Notable for the following components:   Color, Urine YELLOW (*)    APPearance CLEAR (*)    Bacteria, UA MANY (*)    All other components within normal limits  RESP PANEL BY RT-PCR (RSV, FLU A&B, COVID)  RVPGX2  CULTURE, BLOOD (ROUTINE X 2)  CULTURE, BLOOD (ROUTINE X 2)  PROTIME-INR  APTT  LACTIC ACID, PLASMA  LACTIC ACID, PLASMA  BRAIN NATRIURETIC PEPTIDE  LITHIUM LEVEL  CK  CBG MONITORING, ED  EKG:  EKG Interpretation Date/Time:  Wednesday February 20 2023 06:33:53 EST Ventricular Rate:  75 PR Interval:  180 QRS Duration:  138 QT Interval:  486 QTC Calculation: 542 R Axis:   -51  Text Interpretation: Normal sinus rhythm 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  Imaging suspicious for right sided pneumonia.  I have personally reviewed all radiology reports. CT Cervical Spine Wo Contrast Result Date: 02/20/2023 CLINICAL DATA:  78 year old female status post unwitnessed fall. UTI. EXAM: CT CERVICAL SPINE WITHOUT CONTRAST TECHNIQUE: Multidetector CT imaging of the cervical spine was performed without intravenous contrast. Multiplanar CT image reconstructions were also generated. RADIATION DOSE REDUCTION: This exam was performed according to the departmental dose-optimization program which includes automated exposure control, adjustment of the mA and/or kV according to patient size and/or use of iterative reconstruction technique. COMPARISON:  Head CT today.  Prior cervical spine CT 2023-01-24. FINDINGS: Motion  artifact similar to but more pronounced than on 2023-01-24. Alignment: Chronic straightening of cervical lordosis. Cervicothoracic junction alignment is within normal limits. Posterior element alignment appears maintained bilaterally. Skull base and vertebrae: Craniocervical junction alignment appears stable, within normal limits. Motion artifact most pronounced C1 through C3. No acute osseous abnormality identified. Soft tissues and spinal canal: No prevertebral fluid or swelling. No visible canal hematoma. Stable visible noncontrast neck soft tissues including incidental retropharyngeal course of both carotids. Disc levels: Grossly stable when allowing for motion with no strong evidence of cervical spinal stenosis. Upper chest: New confluent right upper lobe peribronchial opacity since 2023-01-24 (series 4, image 82). Underlying bilateral pulmonary septal thickening, with otherwise stable left upper lobe anterior curvilinear scarring. Negative visible trachea. IMPRESSION: 1. Motion degraded exam with no acute traumatic injury identified in the cervical spine. 2. New confluent right upper lobe opacity since December suspicious for Bronchopneumonia. Underlying pulmonary septal thickening raising the possibility of superimposed pulmonary edema. Electronically Signed   By: Odessa Fleming M.D.   On: 02/20/2023 07:09   CT HEAD WO CONTRAST ( ) Result Date: 02/20/2023 CLINICAL DATA:  78 year old female status post unwitnessed fall. UTI. EXAM: CT HEAD WITHOUT CONTRAST TECHNIQUE: Contiguous axial images were obtained from the base of the skull through the vertex without intravenous contrast. RADIATION DOSE REDUCTION: This exam was performed according to the departmental dose-optimization program which includes automated exposure control, adjustment of the mA and/or kV according to patient size and/or use of iterative reconstruction technique. COMPARISON:  Brain MRI 01/02/2023.  Head CT 01/13/2023. FINDINGS: Brain: Study is  intermittently degraded by motion artifact despite repeated imaging attempts. Stable cerebral volume with no ventriculomegaly, midline shift, intracranial mass effect. No acute intracranial hemorrhage identified. Grossly stable gray-white differentiation with no acute cortically based infarct identified. Vascular: No suspicious intracranial vascular hyperdensity. Skull: No fracture identified, mild motion artifact despite repeated imaging attempts. Sinuses/Orbits: Right mastoid and middle ear opacification have increased since last month, with negative appearance of the nasopharynx at that time. Left tympanic cavities, left mastoids and paranasal sinuses remain well aerated. Other: No orbit or scalp soft tissue injury identified, mild motion artifact. IMPRESSION: 1. Motion degraded exam  despite repeated imaging attempts. No acute traumatic injury identified. 2. Progressed opacification of the right middle ear and mastoid air cells since last month. Consider Otitis Media with reactive mastoid effusion. 3. Grossly stable non contrast CT appearance of the brain. Electronically Signed   By: Odessa Fleming M.D.   On: 02/20/2023 07:06   DG Chest Port 1 View Result Date: 02/20/2023 CLINICAL DATA:  78 year old female with possible sepsis. Unwitnessed fall. EXAM: PORTABLE CHEST 1 VIEW COMPARISON:  Chest x-ray 01/13/2023. FINDINGS: Lung volumes are normal. Widespread interstitial prominence and patchy peribronchial cuffing noted throughout the lungs bilaterally. There are also areas of ill-defined opacities concerning for developing airspace disease, most evident in the right upper lobe, likely reflective of bronchopneumonia. No pleural effusions. No pneumothorax. No evidence of pulmonary edema. Heart size appears borderline enlarged. The patient is rotated to the right on today's exam, resulting in distortion of the mediastinal contours and reduced diagnostic sensitivity and specificity for mediastinal pathology. IMPRESSION: 1.  The appearance of the chest is concerning for probable bronchitis and right upper lobe bronchopneumonia, as above. Electronically Signed   By: Trudie Reed M.D.   On: 02/20/2023 06:52     PROCEDURES:  Critical Care performed: YES   CRITICAL CARE Performed by: Baxter Hire Erica Osuna   Total critical care time: 35 minutes  Critical care time was exclusive of separately billable procedures and treating other patients.  Critical care was necessary to treat or prevent imminent or life-threatening deterioration.  Critical care was time spent personally by me on the following activities: development of treatment plan with patient and/or surrogate as well as nursing, discussions with consultants, evaluation of patient's response to treatment, examination of patient, obtaining history from patient or surrogate, ordering and performing treatments and interventions, ordering and review of laboratory studies, ordering and review of radiographic studies, pulse oximetry and re-evaluation of patient's condition.     Marland Kitchen1-3 Lead EKG Interpretation  Performed by: Naythan Douthit, Layla Maw, DO Authorized by: Athony Coppa, Layla Maw, DO     Interpretation: normal     ECG rate:  89   ECG rate assessment: normal     Rhythm: sinus rhythm     Ectopy: none     Conduction: normal       IMPRESSION / MDM / ASSESSMENT AND PLAN / ED COURSE  I reviewed the triage vital signs and the nursing notes.  Patient here with multiple unwitnessed falls.  Being treated for UTI at her nursing facility.  Also has bilateral lower extremity cellulitis.  Currently on Keflex for the past 2 days.  The patient is on the cardiac monitor to evaluate for evidence of arrhythmia and/or significant heart rate changes.   DIFFERENTIAL DIAGNOSIS (includes but not limited to):   Unwitnessed falls, intracranial hemorrhage, cervical spine fracture, cellulitis, UTI, worsening dementia, anemia, electrolyte derangement, metabolic encephalopathy, delirium,  PNA, viral URI, PTX, pulm edema given new hypoxia  Patient's presentation is most consistent with acute presentation with potential threat to life or bodily function.  PLAN: Will obtain CT head and cervical spine.  Patient feels very warm to touch but normal rectal temperature.  Clinically she has bilateral lower extremity cellulitis and is already on Keflex at the nursing home for the past 2 days.  Staff reports she has a UTI.  This appears to be a chronic issue for patient leading to worsening of her mental status.  It looks like during her last hospitalization in mid December the hospitalist recommended hospice.  It is unclear if  hospice has been involved in patient's care yet.  I did attempt to get in touch with her daughter who is listed as her emergency contact without success.  I did leave a HIPAA compliant voicemail.  Will give antibiotics here for possible UTI, cellulitis and obtain labs, urine, cultures, chest x-ray.   MEDICATIONS GIVEN IN ED: Medications  metroNIDAZOLE (FLAGYL) IVPB 500 mg (500 mg Intravenous New Bag/Given 02/20/23 0717)  vancomycin (VANCOCIN) IVPB 1000 mg/200 mL premix (has no administration in time range)  doxycycline (VIBRAMYCIN) 100 mg in sodium chloride 0.9 % 250 mL IVPB (has no administration in time range)  furosemide (LASIX) injection 40 mg (has no administration in time range)  ceFEPIme (MAXIPIME) 2 g in sodium chloride 0.9 % 100 mL IVPB (0 g Intravenous Stopped 02/20/23 0712)     ED COURSE: Labs show no leukocytosis.  Normal hemoglobin.  Creatinine minimally elevated at 1.12.  CT head and cervical spine showed no acute traumatic injury.  Patient appears to have developing right upper lobe pneumonia seen on CT scan and chest x-ray.  Also has some signs of superimposed pulmonary edema.  BNP pending.  She does appear volume overloaded on exam.  Will give IV Lasix.  Will discuss with hospitalist for admission.  I have still not heard back from patient's  family.   7:50 AM  I did talk to patient's daughter by phone.  She states that she would like to confer with other family members in [place] prior to making a decision on goals of care, CODE STATUS, hospice involvement.  CONSULTS:  Consulted and discussed patient's case with hospitalist, Dr. Chipper Herb.  I have recommended admission and consulting physician agrees and will place admission orders.  Patient (and family if present) agree with this plan.   I reviewed all nursing notes, vitals, pertinent previous records.  All labs, EKGs, imaging ordered have been independently reviewed and interpreted by myself.    OUTSIDE RECORDS REVIEWED: Reviewed patient's last 2 admissions in the hospital in December 2024.       FINAL CLINICAL IMPRESSION(S) / ED DIAGNOSES   Final diagnoses:  Recurrent falls  Pneumonia of right upper lobe due to infectious organism  Bilateral lower leg cellulitis  Acute respiratory failure with hypoxia (HCC)  Acute pulmonary edema (HCC)     Rx / DC Orders   ED Discharge Orders     None        Note:  This document was prepared using Dragon voice recognition software and may include unintentional dictation errors.       Kamron Portee, Layla Maw, DO 02/20/23 0751    Sequoia Mincey, Layla Maw, DO 02/20/23 [id number]

## 2023-02-20 NOTE — Progress Notes (Signed)
PT Cancellation Note  Patient Details Name: Lindsay Snyder MRN: [id number] DOB: 25-Jun-1945   Cancelled Treatment:    Reason Eval/Treat Not Completed: Fatigue/lethargy limiting ability to participate (chart reviewed, RN consulted- pt has been heavily lethargic, very difficult to rouse with nursing. RN recommends PT evaluation be moved to later date/time.)  4:27 PM, 02/20/23 Rosamaria Lints, PT, DPT Physical Therapist - [facility]  [phone] (ASCOM)    [place] C 02/20/2023, 4:26 PM

## 2023-02-20 NOTE — H&P (Signed)
History and Physical    Lindsay Snyder:[id number] DOB: 1945/10/31 DOA: 02/20/2023  PCP: Doreene Nest, NP (Confirm with patient/family/NH records and if not entered, this has to be entered at [facility] point of entry) Patient coming from: SNF  I have personally briefly reviewed patient's old medical records in [facility]  Chief Complaint: AMS and falls  HPI: Lindsay Snyder is a 78 y.o. female with medical history significant of Alzheimer's dementia, anxiety/depression, recurrent UTIs, chronic ambulation impairment frequent falls, sent from nursing home for evaluation of unwitnessed falls.  Patient is baseline demented and unable to provide any history.  All history obtained by review of nursing home's record and talking to ED staff.  Patient had 2 unwitnessed falls yesterday.  Appears to have her baseline mentation.  And patient was started on Keflex for a recurrent UTI 2 days ago.  There also appears to be a new hypoxia as patient arrived with 3 L oxygen via nasal cannula which is new. ED Course: Afebrile, no tachycardia nonhypotensive and O2 saturation 98% on 2 L.  Chest x-ray showed right-sided infiltrates on both left upper and lower lobe versus pulmonary congestion.  CT head and neck showed no acute fracture or dislocation but progressed opacification of the right middle ear and's mastoid air cells since last month consider otitis media.  Blood work showed creatinine 1.1, BUN 13, bicarb 23, potassium 3.7, WBCs 9.6, hemoglobin 12.2, platelet 267.  Patient was started on vancomycin, Flagyl and ceftriaxone in the ED.  Review of Systems: Unable to perform, baseline confused to Alzheimer  Past Medical History:  Diagnosis Date   Allergy 010150   Anxiety 08/30/2019   Appendicitis 8    C. difficile diarrhea 02/04/2022   Cancer [facility])    daughter states patient never had cancer   Dementia (HCC)    GERD (gastroesophageal reflux disease) 01/30/2019   Otitis externa 10/14/2020   Polyp  of colon    Benign    Past Surgical History:  Procedure Laterality Date   ABDOMINAL HYSTERECTOMY     APPENDECTOMY     BREAST EXCISIONAL BIOPSY Right    + 20 years neg   COLONOSCOPY WITH PROPOFOL N/A 12/16/2017   Procedure: COLONOSCOPY WITH PROPOFOL;  Surgeon: Wyline Mood, MD;  Location: [facility] ENDOSCOPY;  Service: Gastroenterology;  Laterality: N/A;   COLONOSCOPY WITH PROPOFOL N/A 01/02/2021   Procedure: COLONOSCOPY WITH PROPOFOL;  Surgeon: Wyline Mood, MD;  Location: [facility] ENDOSCOPY;  Service: Gastroenterology;  Laterality: N/A;     reports that she has never smoked. She has never used smokeless tobacco. She reports that she does not drink alcohol and does not use drugs.  Allergies  Allergen Reactions   Crestor [Rosuvastatin Calcium] Rash and Other (See Comments)    Myalgia   Tylenol [Acetaminophen] Rash    Family History  Problem Relation Age of Onset   Diabetes Mother        Deceased   Heart attack Father 31       Deceased   Cancer Father        Colon   Breast cancer Paternal Aunt      Prior to Admission medications   Medication Sig Start Date End Date Taking? Authorizing Provider  cetirizine (ZYRTEC) 10 MG tablet Take 10 mg by mouth daily.    [provider]  diclofenac Sodium (VOLTAREN) 1 % GEL Apply 4 g topically 3 (three) times daily as needed (pain). (Apply to affected area of legs) 12/24/22   [provider]  donepezil (ARICEPT) 5 MG tablet Take 5 mg by mouth at bedtime. 09/29/20   [provider]  DULoxetine (CYMBALTA) 20 MG capsule TAKE 2 CAPSULES(40 MG) BY MOUTH DAILY FOR ANXIETY 08/26/22   Doreene Nest, NP  lithium carbonate 150 MG capsule Take 150 mg by mouth daily. 12/01/21   [provider]  memantine (NAMENDA) 5 MG tablet Take 2 tablets (10 mg total) by mouth daily. For memory 11/29/22   Doreene Nest, NP  mirtazapine (REMERON) 15 MG tablet Take 15 mg by mouth at bedtime.    [provider]  ondansetron  (ZOFRAN-ODT) 4 MG disintegrating tablet Take 1 tablet (4 mg total) by mouth every 8 (eight) hours as needed for nausea or vomiting. 02/09/22   Doreene Nest, NP  oxybutynin (DITROPAN XL) 15 MG 24 hr tablet TAKE 1 TABLET(15 MG) BY MOUTH AT BEDTIME FOR OVERACTIVE BLADDER 01/07/23   Doreene Nest, NP  traZODone (DESYREL) 100 MG tablet Take 100 mg by mouth at bedtime. 12/01/21   [provider]    Physical Exam: Vitals:   02/20/23 4098 02/20/23 0620 02/20/23 0736  BP:  117/61 116/89  Pulse:  89 81  Resp:  15 20  Temp:  98.6 F (37 C)   TempSrc:  Rectal   SpO2: 94% 96% 98%    Constitutional: NAD, calm, comfortable Vitals:   02/20/23 0611 02/20/23 0620 02/20/23 0736  BP:  117/61 116/89  Pulse:  89 81  Resp:  15 20  Temp:  98.6 F (37 C)   TempSrc:  Rectal   SpO2: 94% 96% 98%   Eyes: PERRL, lids and conjunctivae normal ENMT: Mucous membranes are moist. Posterior pharynx clear of any exudate or lesions.Normal dentition.  Neck: normal, supple, no masses, no thyromegaly Respiratory: clear to auscultation bilaterally, no wheezing, fine crackles on bilateral lower fields, increasing respiratory effort. No accessory muscle use.  Cardiovascular: Regular rate and rhythm, no murmurs / rubs / gallops. 2+ extremity edema. 2+ pedal pulses. No carotid bruits.  Abdomen: no tenderness, no masses palpated. No hepatosplenomegaly. Bowel sounds positive.  Musculoskeletal: no clubbing / cyanosis. No joint deformity upper and lower extremities. Good ROM, no contractures. Normal muscle tone.  Skin: no rashes, lesions, ulcers. No induration Neurologic: CN 2-12 grossly intact. Sensation intact, DTR normal. Strength 5/5 in all 4.  Psychiatric: Awake, confused    Labs on Admission: I have personally reviewed following labs and imaging studies  CBC: Recent Labs  Lab 02/20/23 0628  WBC 9.6  NEUTROABS 7.9*  HGB 12.2  HCT 37.6  MCV 89.1  PLT 267   Basic Metabolic Panel: Recent Labs   Lab 02/20/23 0628  NA 138  K 3.7  CL 105  CO2 23  GLUCOSE 106*  BUN 13  CREATININE 1.12*  CALCIUM 8.6*   GFR: CrCl cannot be calculated (Unknown ideal weight.). Liver Function Tests: Recent Labs  Lab 02/20/23 0628  AST 57*  ALT 23  ALKPHOS 56  BILITOT 1.0  PROT 6.1*  ALBUMIN 3.3*   No results for input(s): "LIPASE", "AMYLASE" in the last 168 hours. No results for input(s): "AMMONIA" in the last 168 hours. Coagulation Profile: Recent Labs  Lab 02/20/23 0628  INR 1.1   Cardiac Enzymes: Recent Labs  Lab 02/20/23 0630  CKTOTAL 1,463*   BNP (last 3 results) Recent Labs    03/01/22 1006  PROBNP 36.0   HbA1C: No results for input(s): "HGBA1C" in the last 72 hours. CBG: Recent  Labs  Lab 02/20/23 0640  GLUCAP 96   Lipid Profile: No results for input(s): "CHOL", "HDL", "LDLCALC", "TRIG", "CHOLHDL", "LDLDIRECT" in the last 72 hours. Thyroid Function Tests: No results for input(s): "TSH", "T4TOTAL", "FREET4", "T3FREE", "THYROIDAB" in the last 72 hours. Anemia Panel: No results for input(s): "VITAMINB12", "FOLATE", "FERRITIN", "TIBC", "IRON", "RETICCTPCT" in the last 72 hours. Urine analysis:    Component Value Date/Time   COLORURINE YELLOW (A) 02/20/2023 0628   APPEARANCEUR CLEAR (A) 02/20/2023 0628   LABSPEC 1.014 02/20/2023 0628   PHURINE 6.0 02/20/2023 0628   GLUCOSEU NEGATIVE 02/20/2023 0628   GLUCOSEU NEGATIVE 09/25/2022 1310   HGBUR NEGATIVE 02/20/2023 0628   BILIRUBINUR NEGATIVE 02/20/2023 0628   BILIRUBINUR neg 09/06/2022 1209   KETONESUR NEGATIVE 02/20/2023 0628   PROTEINUR NEGATIVE 02/20/2023 0628   UROBILINOGEN 0.2 09/25/2022 1310   NITRITE NEGATIVE 02/20/2023 0628   LEUKOCYTESUR NEGATIVE 02/20/2023 0628    Radiological Exams on Admission: CT Cervical Spine Wo Contrast Result Date: 02/20/2023 CLINICAL DATA:  78 year old female status post unwitnessed fall. UTI. EXAM: CT CERVICAL SPINE WITHOUT CONTRAST TECHNIQUE: Multidetector CT imaging  of the cervical spine was performed without intravenous contrast. Multiplanar CT image reconstructions were also generated. RADIATION DOSE REDUCTION: This exam was performed according to the departmental dose-optimization program which includes automated exposure control, adjustment of the mA and/or kV according to patient size and/or use of iterative reconstruction technique. COMPARISON:  Head CT today.  Prior cervical spine CT 2023-01-03. FINDINGS: Motion artifact similar to but more pronounced than on 01-03-2023. Alignment: Chronic straightening of cervical lordosis. Cervicothoracic junction alignment is within normal limits. Posterior element alignment appears maintained bilaterally. Skull base and vertebrae: Craniocervical junction alignment appears stable, within normal limits. Motion artifact most pronounced C1 through C3. No acute osseous abnormality identified. Soft tissues and spinal canal: No prevertebral fluid or swelling. No visible canal hematoma. Stable visible noncontrast neck soft tissues including incidental retropharyngeal course of both carotids. Disc levels: Grossly stable when allowing for motion with no strong evidence of cervical spinal stenosis. Upper chest: New confluent right upper lobe peribronchial opacity since 01/03/23 (series 4, image 82). Underlying bilateral pulmonary septal thickening, with otherwise stable left upper lobe anterior curvilinear scarring. Negative visible trachea. IMPRESSION: 1. Motion degraded exam with no acute traumatic injury identified in the cervical spine. 2. New confluent right upper lobe opacity since December suspicious for Bronchopneumonia. Underlying pulmonary septal thickening raising the possibility of superimposed pulmonary edema. Electronically Signed   By: Odessa Fleming M.D.   On: 02/20/2023 07:09   CT HEAD WO CONTRAST ( ) Result Date: 02/20/2023 CLINICAL DATA:  78 year old female status post unwitnessed fall. UTI. EXAM: CT HEAD WITHOUT CONTRAST  TECHNIQUE: Contiguous axial images were obtained from the base of the skull through the vertex without intravenous contrast. RADIATION DOSE REDUCTION: This exam was performed according to the departmental dose-optimization program which includes automated exposure control, adjustment of the mA and/or kV according to patient size and/or use of iterative reconstruction technique. COMPARISON:  Brain MRI 01/02/2023.  Head CT 01/13/2023. FINDINGS: Brain: Study is intermittently degraded by motion artifact despite repeated imaging attempts. Stable cerebral volume with no ventriculomegaly, midline shift, intracranial mass effect. No acute intracranial hemorrhage identified. Grossly stable gray-white differentiation with no acute cortically based infarct identified. Vascular: No suspicious intracranial vascular hyperdensity. Skull: No fracture identified, mild motion artifact despite repeated imaging attempts. Sinuses/Orbits: Right mastoid and middle ear opacification have increased since last month, with negative appearance of the nasopharynx at that time. Left tympanic  cavities, left mastoids and paranasal sinuses remain well aerated. Other: No orbit or scalp soft tissue injury identified, mild motion artifact. IMPRESSION: 1. Motion degraded exam despite repeated imaging attempts. No acute traumatic injury identified. 2. Progressed opacification of the right middle ear and mastoid air cells since last month. Consider Otitis Media with reactive mastoid effusion. 3. Grossly stable non contrast CT appearance of the brain. Electronically Signed   By: Odessa Fleming M.D.   On: 02/20/2023 07:06   DG Chest Port 1 View Result Date: 02/20/2023 CLINICAL DATA:  78 year old female with possible sepsis. Unwitnessed fall. EXAM: PORTABLE CHEST 1 VIEW COMPARISON:  Chest x-ray 01/13/2023. FINDINGS: Lung volumes are normal. Widespread interstitial prominence and patchy peribronchial cuffing noted throughout the lungs bilaterally. There are  also areas of ill-defined opacities concerning for developing airspace disease, most evident in the right upper lobe, likely reflective of bronchopneumonia. No pleural effusions. No pneumothorax. No evidence of pulmonary edema. Heart size appears borderline enlarged. The patient is rotated to the right on today's exam, resulting in distortion of the mediastinal contours and reduced diagnostic sensitivity and specificity for mediastinal pathology. IMPRESSION: 1. The appearance of the chest is concerning for probable bronchitis and right upper lobe bronchopneumonia, as above. Electronically Signed   By: Trudie Reed M.D.   On: 02/20/2023 06:52    EKG: Independently reviewed.  Sinus, chronic RBBB, no acute ST changes.  Assessment/Plan Principal Problem:   CHF (congestive heart failure) (HCC) Active Problems:   Dementia (HCC)   Acute encephalopathy   Altered mental status   Acute on chronic diastolic CHF (congestive heart failure) (HCC)  (please populate well all problems here in Problem List. (For example, if patient is on BP meds at home and you resume or decide to hold them, it is a problem that needs to be her. Same for CAD, COPD, HLD and so on)  Acute hypoxic respiratory failure Acute on chronic HFpEF decompensation -Clinically patient appears to be fluid overload, plan to continue current diuresis regimen of IV Lasix 40 mg twice daily, recheck chest x-ray tomorrow -Review of her most recent echocardiogram showed multiple chronic conditions including preserved LVEF, indeterminant diastolic function, elevated pulmonary artery pressure, mild-moderate AS -Other DDx, I tried to get more detailed history regarding her aspiration risk at bedside as today's x-ray showed more infiltrates on the right side than on the left.  Patient however cannot provide any detailed information regarding her swallowing.  She told me that she has no cough or choke at baseline.  Will keep her n.p.o. for today and plan  to continue antibiotics to double cover pneumonia and UTI.  Consult speech team  Frequent falls -Appears to be acute on chronic -Treat CHF then reevaluate her vital signs -PT evaluation  Alzheimer dementia -Mentation appears to be at baseline. -Continue memantine and Aricept  Recurrent UTI -Recent urine culture showed pansensitive Enterococcus, plan to use ceftriaxone 2 double cover pneumonia and UTI and as well as possible otitis media which is show on the CT head and neck.  Question of otitis media -As above  Anxiety/depression Mood disorder -Continue lithium -Continue SSRI  DVT prophylaxis: Lovenox Code Status: DNR Family Communication: Tried to contacted daughter but nobody picked up the phone Disposition Plan: Patient is sick with CHF and requiring IV diuresis, expect more than 2 midnight hospital stay Consults called: None Admission status: Telemetry admission  Emeline General MD [facility] Pager [phone]  02/20/2023, 9:09 AM

## 2023-02-20 NOTE — Evaluation (Addendum)
Clinical/Bedside Swallow Evaluation Patient Details  Name: Lindsay Snyder MRN: [id number] Date of Birth: [birth year]  Today's Date: 02/20/2023 Time: SLP Start Time (ACUTE ONLY): 0845 SLP Stop Time (ACUTE ONLY): 0945 SLP Time Calculation (min) (ACUTE ONLY): 60 min  Past Medical History:  Past Medical History:  Diagnosis Date   Allergy 010150   Anxiety 08/30/2019   Appendicitis 8    C. difficile diarrhea 02/04/2022   Cancer [facility])    daughter states patient never had cancer   Dementia (HCC)    GERD (gastroesophageal reflux disease) 01/30/2019   Otitis externa 10/14/2020   Polyp of colon    Benign   Past Surgical History:  Past Surgical History:  Procedure Laterality Date   ABDOMINAL HYSTERECTOMY     APPENDECTOMY     BREAST EXCISIONAL BIOPSY Right    + 20 years neg   COLONOSCOPY WITH PROPOFOL N/A 12/16/2017   Procedure: COLONOSCOPY WITH PROPOFOL;  Surgeon: Wyline Mood, MD;  Location[facility] ENDOSCOPY;  Service: Gastroenterology;  Laterality: N/A;   COLONOSCOPY WITH PROPOFOL N/A 01/02/2021   Procedure: COLONOSCOPY WITH PROPOFOL;  Surgeon: Wyline Mood, MD;  Location: [facility] ENDOSCOPY;  Service: Gastroenterology;  Laterality: N/A;   HPI:  Pt is a 78 y.o. female with medical history significant of Alzheimer's Dementia, Obesity, anxiety/depression, recurrent UTIs, chronic ambulation impairment frequent Falls, sent from nursing home for evaluation of unwitnessed falls.  Patient is baseline demented and unable to provide any history.  All history obtained by review of nursing home's record and talking to ED staff.  Patient had 2 unwitnessed falls yesterday.  Appears to have her baseline mentation.  And patient was started on Keflex for a recurrent UTI 2 days ago.  There also appears to be a new hypoxia as patient arrived with 3 L oxygen via nasal cannula which is new.   Chest Imaging: Lung volumes are normal. Widespread interstitial prominence and  patchy peribronchial cuffing noted throughout the  lungs bilaterally.  There are also areas of ill-defined opacities concerning for  developing airspace disease, most evident in the right upper lobe,  likely reflective of bronchopneumonia. No pleural effusions.   Head Imaging: Grossly stable non contrast CT appearance of the brain. Atrophy and white matter disease. This likely reflects the sequela of chronic microvascular ischemia.  Palliative Care followed last admit; ordered again this admit.    Assessment / Plan / Recommendation  Clinical Impression   Pt seen for BSE this morning. pt awake, jargon speech w/ paraphasias and rushed speech. Pt has Baseline Dementia -- suspect impact on her communication. She required MOD+ cues for follow through w/ tasks; again Confusion noted during. Pt was verbose. NSG reported pt is receiving Lasix.  On [place] O2 2L; afebrile. WBC wnl.   Pt appears to present w/ grossly functional oropharyngeal phase swallowing w/ No overt oropharyngeal phase dysphagia noted, No neuromuscular deficits noted. Pt consumed po trials w/ No overt, clinical s/s of aspiration during the po trials.  Pt appears at reduced risk for aspiration/aspiration pneumonia when following general aspiration precautions w/ tray prep/cut foods and general Supervision during meals for support(positioning support). However, pt does have challenging factors that could impact her oropharyngeal swallowing to include discomfort(NSG aware), deconditioning/weakness, acute illness/hospitalization, and Baseline Cognitive decline/Dementia impacting her self-feeding awareness/abilities. These factors can increase risk for dysphagia as well as decreased oral intake overall. ANY Cognitive decline can impact timing and awareness of swallowing during oral intake.  During po trials, pt consumed all consistencies w/ no overt coughing,  decline in vocal quality, or change in respiratory presentation during/post trials. O2 sats remained in upper 90s. Oral phase appeared grossly [facility] w/  timely bolus management, mastication, and control of bolus propulsion for A-P transfer for swallowing. Oral clearing achieved w/ all trial consistencies -- moistened, soft foods given.  OM Exam appeared [facility] w/ no unilateral weakness noted. Speech Clear. Pt fed self w/ setup and MOD+ support.   Recommend a more Mech Soft consistency diet w/ well-Cut meats, moistened foods(ease of self-feeding); Thin liquids -- monitor straw use, and pt should help to Hold Cup when drinking. Recommend general aspiration precautions, reduce distractions at meals. Support feeding and positioning upright for oral intake. Pills WHOLE vs need to CRUSH in Puree for safer, easier swallowing d/t Baseline Cognitive decline. This is encouraged now and for D/C to NSG.  Education given on Pills in Puree; food consistencies and easy to eat options; general aspiration precautions to pt and NSG. No further skilled ST services indicated currently. MD/NSG to reconsult if any new needs arise during admit. MD/NSG updated, agreed. MD updated on pt's c/o "sore" R knee.  Recommend Dietician f/u for support. Noted Palliative Care following. Precautions posted in room/chart. SLP Visit Diagnosis: Dysphagia, unspecified (R13.10)    Aspiration Risk   (reduced following general aspiration precautions)    Diet Recommendation   Thin;Dysphagia 3 (mechanical soft) (ease of cut foods/meats)  Medication Administration: Whole meds with puree (vs need to Crush in puree d/t her declined Cognition at baseline)    Other  Recommendations Recommended Consults:  (Dietician; Palliative Care) Oral Care Recommendations: Oral care BID;Oral care before and after PO;Staff/trained caregiver to provide oral care    Recommendations for follow up therapy are one component of a multi-disciplinary discharge planning process, led by the attending physician.  Recommendations may be updated based on patient status, additional functional criteria and insurance  authorization.  Follow up Recommendations No SLP follow up      Assistance Recommended at Discharge    Functional Status Assessment Patient has not had a recent decline in their functional status  Frequency and Duration  (n/a)   (n/a)       Prognosis Prognosis for improved oropharyngeal function: Fair (-Good) Barriers to Reach Goals: Cognitive deficits;Language deficits;Time post onset;Severity of deficits Barriers/Prognosis Comment: baseline Dementia      Swallow Study   General Date of Onset: 02/19/23 HPI: Pt is a 78 y.o. female with medical history significant of Alzheimer's Dementia, Obesity, anxiety/depression, recurrent UTIs, chronic ambulation impairment frequent Falls, sent from nursing home for evaluation of unwitnessed falls.  Patient is baseline demented and unable to provide any history.  All history obtained by review of nursing home's record and talking to ED staff.  Patient had 2 unwitnessed falls yesterday.  Appears to have her baseline mentation.  And patient was started on Keflex for a recurrent UTI 2 days ago.  There also appears to be a new hypoxia as patient arrived with 3 L oxygen via nasal cannula which is new.   Chest Imaging: Lung volumes are normal. Widespread interstitial prominence and  patchy peribronchial cuffing noted throughout the lungs bilaterally.  There are also areas of ill-defined opacities concerning for  developing airspace disease, most evident in the right upper lobe,  likely reflective of bronchopneumonia. No pleural effusions.   Head Imaging: Grossly stable non contrast CT appearance of the brain. Atrophy and white matter disease. This likely reflects the sequela of chronic microvascular ischemia.  Palliative Care followed last admit;  ordered again this admit. Type of Study: Bedside Swallow Evaluation Previous Swallow Assessment: none Diet Prior to this Study: NPO Temperature Spikes Noted: No (wbc 9.6) Respiratory Status: Nasal cannula  (2L) History of Recent Intubation: No Behavior/Cognition: Alert;Cooperative;Pleasant mood;Confused;Distractible;Requires cueing (baseline Dementia) Oral Cavity Assessment: Within Functional Limits Oral Care Completed by SLP: Yes Oral Cavity - Dentition: Adequate natural dentition Vision: Functional for self-feeding Self-Feeding Abilities: Able to feed self;Needs assist;Needs set up;Total assist (d/t confusion) Patient Positioning: Upright in bed (max assist) Baseline Vocal Quality: Normal (rushed speech) Volitional Cough: Cognitively unable to elicit Volitional Swallow: Unable to elicit    Oral/Motor/Sensory Function Overall Oral Motor/Sensory Function: Within functional limits   Ice Chips Ice chips: Within functional limits Presentation: Spoon (fed; 3 trials)   Thin Liquid Thin Liquid: Within functional limits Presentation: Cup;Self Fed;Straw (~6 ozs) Other Comments: water, juice    Nectar Thick Nectar Thick Liquid: Not tested   Honey Thick Honey Thick Liquid: Not tested   Puree Puree: Within functional limits Presentation: Self Fed;Spoon (4 ozs)   Solid     Solid: Within functional limits Presentation: Spoon;Self Fed (8+ trials) Other Comments: moistened        Jerilynn Som, MS, CCC-SLP Speech Language Pathologist Rehab Services; [facility] [phone] (ascom) Reyah Streeter 02/20/2023,12:56 PM

## 2023-02-20 NOTE — ED Triage Notes (Signed)
Patient arrives from [facility] for a fall. Fall was unwitnessed. EMS was called out for a public assistance call to assist the patient back into her bed. EMS arrived and they stated she was being treated for a UTI and yeast. Patient has been on keflex for 2 days. EMS reports 2 falls within the last 24 hours. 1st fall was witnessed. Patient is alert to her name only. Unknown LOC. Unknown medications, no MAR sent. Patient arrives on 3L [place]. Patient does not use oxygen at baseline.

## 2023-02-20 NOTE — Progress Notes (Signed)
CODE SEPSIS - PHARMACY COMMUNICATION  **Broad Spectrum Antibiotics should be administered within 1 hour of Sepsis diagnosis**  Time Code Sepsis Called/Page Received: 0621  Antibiotics Ordered: Cefepime, Flagyl, Vancomycin  Time of 1st antibiotic administration: 6045  Otelia Sergeant, PharmD, [facility] 02/20/2023 6:42 AM

## 2023-02-20 NOTE — Sepsis Progress Note (Signed)
Elink monitoring for the code sepsis protocol.  

## 2023-02-20 NOTE — Progress Notes (Signed)
OT Cancellation Note  Patient Details Name: Lindsay Snyder MRN: [id number] DOB: Nov 08, 1945   Cancelled Treatment:    Reason Eval/Treat Not Completed: Fatigue/lethargy limiting ability to participate. Per discussion with PT, nursing staff reporting pt lethargic and not able to engage meaningfully. OT will hold eval at this time until pt can participate in session.  Ricardo Schubach [name], OTR/L  02/20/23, 1:32 PM

## 2023-02-20 NOTE — Progress Notes (Signed)
Calcitonin level low, clinically most of patient's symptoms can be explained by CHF.  In addition there is no other signs of systemic inflammation/infection such as fever or leukocytosis, will discontinue antibiotics.  Discussed with speech team, patient has no aspiration risk at this point.  Patient does complain about right knee soreness during speech evaluation, will order right knee x-ray.

## 2023-02-20 NOTE — Consult Note (Signed)
Consultation Note Date: 02/20/2023 at 1130  Patient Name: Lindsay Snyder  DOB: 1945/09/11  MRN: [id number]  Age / Sex: 78 y.o., female  PCP: Doreene Nest, NP Referring Physician: Emeline General, MD  HPI/Patient Profile: 78 y.o. female  with past medical history of Alzheimer's dementia, anxiety, depression, recurrent UTIs, chronic ambulation impairment-frequent falls, and GERD admitted on 02/20/2023 with unwitnessed fall at her assisted living facility [facility].  On presentation to the ED, patient's chest x-ray revealed right sided infiltrates of both left upper and lower lobe versus pulmonary congestion.  CT of head and neck revealed no acute fracture or dislocation but progressed opacification of the right middle ear and mastoid air cells as compared to last months.  Otitis media is suspected.  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  Patient was started on vancomycin, Flagyl, and ceftriaxone in the ED.  ENT was consulted to discuss boundaries and goals of care.  Clinical Assessment and Goals of Care: Extensive chart review completed prior to meeting patient including labs, vital signs, imaging, progress notes, orders, and available advanced directive documents from current and previous encounters. I then met with patient at bedside in the ED.  She is alert to self.  She tells me that she just wants to sleep.  Patient is unable to participate in goals of care medical decision making independently at this time.  I assessed patient's symptoms.  She denies pain, discomfort, or acute issues at this time.  No adjustment to [facility] needed.  After visiting with the patient, I spoke with her son/POA Christiane Ha over the phone to discuss diagnosis prognosis, GOC, EOL wishes, disposition and options.  I  introduced Palliative Medicine as specialized medical care for people living with serious illness. It focuses on providing relief from the symptoms and stress of a serious illness. The goal is to improve quality of life for both the patient and the family.  We discussed patient's current illness-UTI, CHF exacerbation-and what it means in the larger context of patient's on-going co-morbidities-dementia, recurrent falls.  Counseling with Christiane Ha about patient's overall prognosis.  Education provided on dementia as a chronic, progressive, and irreversible disease that is often exacerbated by acute illnesses and hospitalizations.    Natural disease trajectory and expectations at EOL were discussed.  I attempted to elicit values and goals of care important to the patient.  Algis Greenhouse shares that he and his siblings have spoken and believes that she will continue to have recurrent hospitalizations that will be more frequent.  They have discussed that they want to make sure she does not suffer and has a better quality of life.  They believe they would like to move forward with having patient discharge with hospice services.  He shares that assuming she continues to improve and gets the appropriate treatments during this hospitalization, he believes that he and his siblings would like to discuss hospice services.  We agreed to monitor patient  overnight and regroup tomorrow.  Hospice services, hospice philosophy, aging in place, and focusing on quality of life briefly discussed with Christiane Ha.  He was appreciative of our discussion.  Therapeutic silence, active listening, and emotional support provided.  Space and opportunity given for Christiane Ha to share his thoughts and emotions regarding his mother's current health situation.  DNR with limited interventions remains.  PMT will continue to follow and support patient throughout her hospitalization.  No adjustment to plan of care at this time.  Primary Decision  Maker HCPOA  Physical Exam Vitals reviewed.  Constitutional:      General: She is not in acute distress.    Appearance: She is obese.  HENT:     Head: Normocephalic.     Mouth/Throat:     Mouth: Mucous membranes are moist.  Eyes:     Pupils: Pupils are equal, round, and reactive to light.  Abdominal:     Palpations: Abdomen is soft.  Musculoskeletal:     Comments: Generalized weakness  Skin:    General: Skin is warm and dry.  Neurological:     Mental Status: She is alert.     Comments: Oriented to self  Psychiatric:        Behavior: Behavior normal.     Palliative Assessment/Data: 40-50%     Thank you for this consult. Palliative medicine will continue to follow and assist holistically.   Time Total: 75 minutes  Time spent includes: Detailed review of medical records (labs, imaging, vital signs), medically appropriate exam (mental status, respiratory, cardiac, skin), discussed with treatment team, counseling and educating patient, family and staff, documenting clinical information, medication management and coordination of care.  Signed by: Georgiann Cocker, DNP, FNP-BC Palliative Medicine   Please contact Palliative Medicine Team providers via [facility] for questions and concerns.

## 2023-02-20 NOTE — ED Notes (Signed)
Pt told this nurse that the "man behind her told her" to spit her pills and water out at me. There is no one else in the room at this time.

## 2023-02-21 ENCOUNTER — Observation Stay: Payer: Medicare HMO

## 2023-02-21 DIAGNOSIS — L03115 Cellulitis of right lower limb: Secondary | ICD-10-CM | POA: Diagnosis not present

## 2023-02-21 DIAGNOSIS — R296 Repeated falls: Secondary | ICD-10-CM | POA: Diagnosis not present

## 2023-02-21 DIAGNOSIS — J189 Pneumonia, unspecified organism: Secondary | ICD-10-CM | POA: Diagnosis not present

## 2023-02-21 DIAGNOSIS — Z515 Encounter for palliative care: Secondary | ICD-10-CM | POA: Diagnosis not present

## 2023-02-21 DIAGNOSIS — J9601 Acute respiratory failure with hypoxia: Secondary | ICD-10-CM | POA: Diagnosis not present

## 2023-02-21 DIAGNOSIS — I509 Heart failure, unspecified: Secondary | ICD-10-CM | POA: Diagnosis not present

## 2023-02-21 DIAGNOSIS — I5033 Acute on chronic diastolic (congestive) heart failure: Secondary | ICD-10-CM | POA: Diagnosis not present

## 2023-02-21 DIAGNOSIS — I517 Cardiomegaly: Secondary | ICD-10-CM | POA: Diagnosis not present

## 2023-02-21 DIAGNOSIS — I700 Atherosclerosis of aorta: Secondary | ICD-10-CM | POA: Diagnosis not present

## 2023-02-21 DIAGNOSIS — R918 Other nonspecific abnormal finding of lung field: Secondary | ICD-10-CM | POA: Diagnosis not present

## 2023-02-21 DIAGNOSIS — G9340 Encephalopathy, unspecified: Secondary | ICD-10-CM | POA: Diagnosis not present

## 2023-02-21 DIAGNOSIS — J810 Acute pulmonary edema: Secondary | ICD-10-CM | POA: Diagnosis not present

## 2023-02-21 DIAGNOSIS — F03B4 Unspecified dementia, moderate, with anxiety: Secondary | ICD-10-CM | POA: Diagnosis not present

## 2023-02-21 DIAGNOSIS — L03116 Cellulitis of left lower limb: Secondary | ICD-10-CM | POA: Diagnosis not present

## 2023-02-21 LAB — BASIC METABOLIC PANEL
Anion gap: 14 (ref 5–15)
BUN: 11 mg/dL (ref 8–23)
CO2: 24 mmol/L (ref 22–32)
Calcium: 8.7 mg/dL — ABNORMAL LOW (ref 8.9–10.3)
Chloride: 102 mmol/L (ref 98–111)
Creatinine, Ser: 1.33 mg/dL — ABNORMAL HIGH (ref 0.44–1.00)
GFR, Estimated: 41 mL/min — ABNORMAL LOW (ref >60)
Glucose, Bld: 89 mg/dL (ref 70–99)
Potassium: 3.7 mmol/L (ref 3.5–5.1)
Sodium: 140 mmol/L (ref 135–145)

## 2023-02-21 LAB — MYCOPLASMA PNEUMONIAE ANTIBODY, IGM: Mycoplasma pneumo IgM: <770 U/mL (ref 0–769)

## 2023-02-21 LAB — LEGIONELLA PNEUMOPHILA SEROGP 1 UR AG: L. pneumophila Serogp 1 Ur Ag: NEGATIVE

## 2023-02-21 LAB — CK: Total CK: 578 U/L — ABNORMAL HIGH (ref 38–234)

## 2023-02-21 MED ORDER — QUETIAPINE FUMARATE 25 MG PO TABS
25.0000 mg | ORAL_TABLET | Freq: Every day | ORAL | Status: DC
  Administered 2023-02-21 – 2023-02-25 (×5): 25 mg via ORAL
  Filled 2023-02-21 (×5): qty 1

## 2023-02-21 MED ORDER — SODIUM CHLORIDE 0.9 % IV SOLN
2.0000 g | INTRAVENOUS | Status: DC
Start: 2023-02-21 — End: 2023-02-23
  Administered 2023-02-21 – 2023-02-23 (×3): 2 g via INTRAVENOUS
  Filled 2023-02-21 (×3): qty 20

## 2023-02-21 NOTE — ED Notes (Signed)
Pt ate 50% of her dinner with assistance and drank 100% of her beverages. Pt is now resting at this time. Sitter at bedside.

## 2023-02-21 NOTE — ED Notes (Signed)
This tech and Alexia NT changed the pt's brief and chux pad after urinary incontinence. The pt is now resting in bed with the sitter at bedside.

## 2023-02-21 NOTE — ED Notes (Signed)
Bilateral mittens replaced at this time. Pt continues to grab at mittens and monitoring wires. Able to redirect patient but only for short time then pt returns to attempting to remove mittens.

## 2023-02-21 NOTE — ED Notes (Signed)
Pt had urinary incontinence. This tech and International aid/development worker changed pt's chux pad and brief. Peri care was also performed. Pt now resting in bed at this time. Sitter at bedside.

## 2023-02-21 NOTE — ED Notes (Signed)
Pt removed mittens that were placed on previous shift. Pt not pulling at wires at this time, will leave mittens off until needed. Pt resting comfortably in bed w/ equal rise and fall of chest noted.

## 2023-02-21 NOTE — Progress Notes (Signed)
Palliative Care Progress Note, Assessment & Plan   Patient Name: Lindsay Snyder       Date: 02/21/2023 DOB: [birth year]  Age: 78 y.o. MRN#: [id number] Attending Physician: Pennie Banter, DO Primary Care Physician: Doreene Nest, NP Admit Date: 02/20/2023  Subjective: Patient is lying in bed in ED stretcher.  She acknowledges my presence.  Her daughter and son are at bedside during my visit.  HPI: 78 y.o. female  with past medical history of Alzheimer's dementia, anxiety, depression, recurrent UTIs, chronic ambulation impairment-frequent falls, and GERD admitted on 02/20/2023 with unwitnessed fall at her assisted living facility [facility].   On presentation to the ED, patient's chest x-ray revealed right sided infiltrates of both left upper and lower lobe versus pulmonary congestion.  CT of head and neck revealed no acute fracture or dislocation but progressed opacification of the right middle ear and mastoid air cells as compared to last months.  Otitis media is suspected.   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  Patient was started on vancomycin, Flagyl, and ceftriaxone in the ED.   ENT was consulted to discuss boundaries and goals of care.  Summary of counseling/coordination of care: Extensive chart review completed prior to meeting patient including labs, vital signs, imaging, progress notes, orders, and available advanced directive documents from current and previous encounters.   After reviewing the patient's chart and assessing the patient at bedside, I spoke with patient in regards to symptom management and goals of care.   Symptoms assessed.  Patient has no acute complaints at  this time.  Nonverbal signs of distress or pain not noted.  Patient is unable to participate in goals of care or medical decision making at this time.  When asked how she is feeling, she shares that I am the party girl and that I need to talk to the band.  I spoke with patient's son and daughter at bedside in regards to boundaries and goals of care.  I shared concern that patient has fluctuating delirium that results in agitation and aggravation.  Family shares that she often times becomes aggravated in the evenings and this contributes to poor sleep.  Counseled family and patient on use of Seroquel at a low dose starting at nighttime to assist with daytime wakefulness and nighttime restfulness.  Family in agreement and appreciative of any adjustments that could help patient remain calm and get better sleep.  Recommendation for low-dose Seroquel 25 mg p.o. nightly to start tonight.  I discussed goals of care with patient's family at bedside.  They remain in agreement that they would like for patient to discharge with hospice services to follow.  Hospice services, philosophy, and aging in place reviewed with family.  Quality of life versus quantity of life discussed.  Patient's family interested in speaking with TOC to discuss choice of hospice agencies available to patient at discharge.  They want to continue to treat the treatable during this hospitalization.  They are not shifting to comfort measures at this time.  However, at discharge they would like for hospice services to follow.  TOC made aware  of family's wishes to discharge with hospice.  DNR with limited interventions confirmed with patient's family at bedside.  PMT remains available to patient and family throughout her hospitalization and will continue to follow and support.   Physical Exam Vitals reviewed.  Constitutional:      General: She is not in acute distress.    Appearance: She is obese.  HENT:     Head: Normocephalic.      Mouth/Throat:     Mouth: Mucous membranes are moist.  Eyes:     Pupils: Pupils are equal, round, and reactive to light.  Pulmonary:     Effort: Pulmonary effort is normal.  Abdominal:     Palpations: Abdomen is soft.  Skin:    General: Skin is warm and dry.  Neurological:     Mental Status: She is alert.     Comments: Oriented to self  Psychiatric:        Behavior: Behavior normal.             Total Time 50 minutes   Time spent includes: Detailed review of medical records (labs, imaging, vital signs), medically appropriate exam (mental status, respiratory, cardiac, skin), discussed with treatment team, counseling and educating patient, family and staff, documenting clinical information, medication management and coordination of care.  Samara Deist L. Bonita Quin, DNP, FNP-BC Palliative Medicine Team

## 2023-02-21 NOTE — ED Notes (Signed)
Patient is confused. Patient has removed telemetry, iv and purewick multiple times this shift. Patient has to be reoriented frequently. Patient is rambling and has a incomprehensible conversation. Made adjustment to environment, applied mittens and increased rounding.

## 2023-02-21 NOTE — ED Notes (Signed)
Pt ate 1/3 of lunch. This tech assisted her with her meal. She didn't want any more food at this time. Will see if she wants anything more to eat later. Pt did drink 100% of her beverages (water and tea).

## 2023-02-21 NOTE — Care Management Obs Status (Signed)
MEDICARE OBSERVATION STATUS NOTIFICATION   Patient Details  Name: Lindsay Snyder MRN: [id number] Date of Birth: 06/20/1945   Medicare Observation Status Notification Given:  Yes    Sherilyn Banker 02/21/2023, 3:12 PM

## 2023-02-21 NOTE — ED Notes (Signed)
Pt brief soiled w/ urine. Pt cleaned and new brief placed on pt. External catheter placed at this time.

## 2023-02-21 NOTE — ED Notes (Signed)
Rounded on pt and pt had self-removed her IV in right arm. Mittens placed on bilateral hands and IV started in left FA.

## 2023-02-21 NOTE — ED Notes (Signed)
PT at beside  

## 2023-02-21 NOTE — Evaluation (Signed)
Occupational Therapy Evaluation Patient Details Name: Lindsay Snyder MRN: [id number] DOB: Sep 29, 1945 Today's Date: 02/21/2023   History of Present Illness Pt is a 78 y.o. female presenting to [facility] ED after unwitnessed fall OOB with BLE cellulitis. Currently being treated for recurrent UTI, acute hypoxic resp failure, & CHF exacerbation. CT scan neg for acute traumatic injury. PMH significant for advanced dementia, UTIs, frequent falls, rhabdo, recent hospitalization 12/15-12/18 for AMS.   Clinical Impression   Pt received sitting EOB with PT and NT in room upon arrival. Pt pleasant, alert to name only. Poor historian, no family available to provide PLOF or home setup. Info taken from chart review. Pt is from [facility] and had 2 recent falls. Pt transfers from EOB to recliner with +1 min HHA step pivot (+2 for safety). Anticipate max-total assist for ADL performance this date due to deficits in executive functioning with baseline dementia. Pt does not use items appropriately (cannot wash face with washcloth) and often becomes tangential, talking about random topics out of context. Will place pt on OT trial to continue to work on functional mobility and ADL performance to reduce falls risk and decrease caregiver burden. Pt will needs 24/7 assist at next LOC for all areas of ADL + mobility. Pt would benefit from continued [facility] OT services at [facility] upon hospital discharge.      If plan is discharge home, recommend the following: A little help with walking and/or transfers;A lot of help with bathing/dressing/bathroom;Assistance with cooking/housework;Assistance with feeding;Direct supervision/assist for medications management;Direct supervision/assist for financial management;Assist for transportation;Help with stairs or ramp for entrance;Supervision due to cognitive status    Functional Status Assessment  Patient has had a recent decline in their functional status and demonstrates the  ability to make significant improvements in function in a reasonable and predictable amount of time.  Equipment Recommendations  None recommended by OT    Recommendations for Other Services Other (comment)     Precautions / Restrictions Precautions Precautions: Fall Precaution Comments: dementia Restrictions Weight Bearing Restrictions Per Provider Order: No      Mobility Bed Mobility               General bed mobility comments: pt recieved sitting EOB with PT upon arrival    Transfers Overall transfer level: Needs assistance Equipment used: 1 person hand held assist Transfers: Bed to chair/wheelchair/BSC       Step pivot transfers: +2 safety/equipment, Min assist            Balance Overall balance assessment: History of Falls, Needs assistance Sitting-balance support: Feet supported, No upper extremity supported Sitting balance-Leahy Scale: Good     Standing balance support: Bilateral upper extremity supported Standing balance-Leahy Scale: Poor                             ADL either performed or assessed with clinical judgement   ADL Overall ADL's : Needs assistance/impaired     Grooming: Wash/dry face;Wash/dry hands;Sitting Grooming Details (indicate cue type and reason): OT provides washcloth to pt while in recliner; pt laughing and happy to have a warm washcloth. She immediately places washcloth over her eyes, nose and mouth and leans her head back "this feels so good!!". With max cues, pt is unable to perform task of washing face as she perseverates on placing it instead             Lower Body Dressing: Total assistance;Sitting/lateral leans Lower  Body Dressing Details (indicate cue type and reason): total A to don socks Toilet Transfer: +2 for safety/equipment;Minimal assistance;BSC/3in1   Toileting- Clothing Manipulation and Hygiene: Total assistance;Sit to/from stand       Functional mobility during ADLs: Minimal  assistance;Cueing for safety;Cueing for sequencing;+2 for safety/equipment General ADL Comments: Pt limited by deficits in executive functioning d/t baseline dementia. Pt will require max-totalA for all ADLs, +2 for transfers (EOB>recliner min HHA +2, step pivot).      Pertinent Vitals/Pain Pain Assessment Pain Assessment: No/denies pain     Extremity/Trunk Assessment Upper Extremity Assessment Upper Extremity Assessment: Difficult to assess due to impaired cognition;Generalized weakness   Lower Extremity Assessment Lower Extremity Assessment: Difficult to assess due to impaired cognition;Generalized weakness       Communication Communication Communication: Difficulty communicating thoughts/reduced clarity of speech;Difficulty following commands/understanding Following commands: Follows one step commands inconsistently;Follows one step commands with increased time Cueing Techniques: Verbal cues;Tactile cues   Cognition Arousal: Alert Behavior During Therapy: WFL for tasks assessed/performed Overall Cognitive Status: History of cognitive impairments - at baseline                                 General Comments: Pt alert to self only. Pleasant, talkative, follows some commands with increased time but inconsistenly. Does not perform routine tasks without step-by-step cuing.     General Comments  BLE swelling            Home Living Family/patient expects to be discharged to:: Unsure                                 Additional Comments: Pt unable to provide PLOF or home setup. Chart review indicates pt from [facility].      Prior Functioning/Environment Prior Level of Function : Patient poor historian/Family not available               ADLs Comments: Chart review indicates decline in ADL performance due to cognitive deficits.        OT Problem List: Increased edema;Obesity;Decreased strength;Decreased activity tolerance;Impaired  balance (sitting and/or standing);Decreased cognition;Decreased safety awareness;Decreased knowledge of use of DME or AE;Decreased knowledge of precautions      OT Treatment/Interventions: Self-care/ADL training;Neuromuscular education;Energy conservation;DME and/or AE instruction;Therapeutic activities;Cognitive remediation/compensation;Patient/family education;Balance training    OT Goals(Current goals can be found in the care plan section) Acute Rehab OT Goals OT Goal Formulation: Patient unable to participate in goal setting Time For Goal Achievement: 03/07/23 Potential to Achieve Goals: Fair  OT Frequency: Min 1X/week       AM-PAC OT "6 Clicks" Daily Activity     Outcome Measure Help from another person eating meals?: A Little Help from another person taking care of personal grooming?: A Little Help from another person toileting, which includes using toliet, bedpan, or urinal?: Total Help from another person bathing (including washing, rinsing, drying)?: Total Help from another person to put on and taking off regular upper body clothing?: Total Help from another person to put on and taking off regular lower body clothing?: Total 6 Click Score: 10   End of Session Nurse Communication: Mobility status  Activity Tolerance: Patient tolerated treatment well Patient left: in chair;with nursing/sitter in room;with call bell/phone within reach (sitter in room)  OT Visit Diagnosis: Muscle weakness (generalized) (M62.81);History of falling (Z91.81);Repeated falls (R29.6);Cognitive communication deficit (R41.841);Other symptoms and signs  involving cognitive function Symptoms and signs involving cognitive functions: Other cerebrovascular disease                Time: 6295-2841 OT Time Calculation (min): 20 min Charges:  OT General Charges $OT Visit: 1 Visit OT Evaluation $OT Eval Low Complexity: 1 Low OT Treatments $Self Care/Home Management : 8-22 mins  Ikeem Cleckler L. Freddie Nghiem, OTR/L   02/21/23, 3:30 PM

## 2023-02-21 NOTE — Progress Notes (Addendum)
Progress Note   Patient: Lindsay Snyder QMV:[id number] DOB: 12/25/1945 DOA: 02/20/2023     1 DOS: the patient was seen and examined on 02/21/2023   Brief hospital course: "Lindsay Snyder is a 78 y.o. female with medical history significant of Alzheimer's dementia, anxiety/depression, recurrent UTIs, chronic ambulation impairment frequent falls, sent from nursing home for evaluation of unwitnessed falls. "  See H&P for full HPI on admission & ED course.  Pt was admitted to the hospital for further evaluation and management of acute respiratory failure with hypoxia due to acute HFpEF decompensation (clinically more likely than pneumonia).  Pt was started on antibiotics for probable UTI and acute otitis media noted on imaging.  Further hospital course and management as outlined below.   Assessment and Plan:  Acute respiratory failure with hypoxia Acute on chronic HFpEF decompensation On admission, pt clinically fluid overloaded.   Recent Echo on 01/02/2023 showed preserved LVEF, indeterminant diastolic function, elevated pulmonary artery pressure, mild-moderate AS Net IO Since Admission: -1,046.46 mL [02/21/23 1357] Repeat chest x-ray this AM (1/23) showed almost complete resolution of the RUL opacity consistent with clearing edema, mild vascular congestion less than prior. Cr increased 1.12 >> 1.33 with IV Lasix --Hold off diuresis for today --Strict Io's, daily weights --Monitor renal function & electrolytes  AKI - not POA, due to diuresis, mild. Cr increased from 1.12 >> 1.33 with initial diuresis. Baseline Cr appears 0.8-1.0 recently. --Monitor BMP --Caution with diuresis --Avoid other nephrotoxins & hypotension  Left lower extremity rash vs Cellulitis - images in chart from today 1/23 --On antibiotics as above.   --Monitor site closely for improvement  ?Dysphagia - given right-sided infiltrates on initial CXR, question if patient aspirates, given underlying dementia --SLP  consulted on admission, appreciate input on swallow & diet  Acute otitis media --Continue Rocephin  Recurrent UTI --Continue Rocephin --Added on urine culture since none was sent  Frequent falls -- ALF staff reported pt had 2 unwitnessed falls the day before admission.  Possibly increased weakness from CHF decompensation, ?infection --PT/OT evaluations --Fall precautions --Mgmt of UTI, OM and CHF as above   Alzheimers dementia - mental status seems at baseline. --Recruitment consultant as needed --Continue memantine and Aricept --Start low dose Seroquel at bedtime --Delirium precautions   Anxiety / Depression / Mood disorder --Continue lithium and SSRI Note that lithium level appears chronically subtherapeutic      Subjective: Pt seen in ED holding for a bed, wearing mittens, sitter at bedside.  Pt is alert, only oriented to self, talking but out of context to the conversation. No acute events reported.  Pt says she feels better but unable to elaborate on any specific symptoms.    Physical Exam: Vitals:   02/21/23 0756 02/21/23 0808 02/21/23 1003 02/21/23 1321  BP:  (!) 129/57  (!) 139/109  Pulse:  74  80  Resp:  16  17  Temp: 98.1 F (36.7 C)   98.1 F (36.7 C)  TempSrc: Oral   Oral  SpO2:  96% 97% 97%   General exam: awake, alert, no acute distress, confused HEENT: moist mucus membranes, hearing grossly normal  Respiratory system: CTAB diminished bases, no wheezes, rales or rhonchi, normal respiratory effort. On room air Cardiovascular system: normal S1/S2, RRR, trace-1+ BLE edema.   Gastrointestinal system: soft, NT, ND, no HSM felt, +bowel sounds. Central nervous system: A&O x 1. no gross focal neurologic deficits, normal speech Extremities: images below - distal anterior RLE with erythematous patch with no  differential warmth (significantly less erythema distal LLE but some patchy noted), trace-1+ BLE edema Skin: legs as above and pictured below. Otherwise skin dry  intact normal temp and color Psychiatry: normal mood, congruent affect, abnormal judgement and insight due to dementia       Data Reviewed:  Notable labs --  Cr 1.12 >> 1.33, Ca 8.7,  CK 1463 >> 578  Family Communication: None present  Disposition: Status is: Observation Remains admitted for AKI, and remains on IV antibiotics   Planned Discharge Destination:  return to ALF versus SNF/rehab    Time spent: 55 minutes including time at bedside and coordination of care including chart review, interpretation of diagnostic studies and clinical management.   Author: Pennie Banter, DO 02/21/2023 1:53 PM  For on call review [email or web address].

## 2023-02-21 NOTE — Evaluation (Signed)
Physical Therapy Evaluation Patient Details Name: Lindsay Snyder MRN: [id number] DOB: 10-Jan-1946 Today's Date: 02/21/2023  History of Present Illness  Lindsay Snyder is a 77yoF who comes [facility] on [facility]23 after unwitnessed fall x2 at ALF. Patient was started on Keflex for a recurrent UTI 2 days agoNew hypoxia as patient arrived with 3 L oxygen via nasal cannula, chest x-ray showed right-sided infiltrates on both left upper and lower lobe. PMH: alzheimers dementia, GAD, depression, recurrent UTI, frequent falls.  lives at [facility].  Clinical Impression  Pt in ED in hillrom bed, awake, interactive, pleasant, follows cues for simple tasks >50% of time but has word salad which limits verbal expression. Pt given tactile, visual, and verbal cues for bed mobility, STS, and step pivot to recliner. Pt appears fatigued with efforts. Per chart pt has been a frequent faller 2/2 rapid progression of alzheimer's dementia. Pt is somewhat mobile at baseline at facility, which seems appropriate with assistance at their discretion, but I would imagine continued deterioration of safety awareness and mobility tolerance. Pt is total care at baseline. She should continue to perform transfers and bed mobility during routine nursing care. No need for skilled PT services at this time/venue due to limited capacity to follow commands for modifying mobility, limited retention of safety education. Will sign off. Thank you for the consult.       If plan is discharge home, recommend the following: Two people to help with walking and/or transfers;Two people to help with bathing/dressing/bathroom;Help with stairs or ramp for entrance;Assist for transportation;Direct supervision/assist for financial management   Can travel by private vehicle   No    Equipment Recommendations None recommended by PT  Recommendations for Other Services       Functional Status Assessment Patient has had a recent decline in their functional  status and demonstrates the ability to make significant improvements in function in a reasonable and predictable amount of time.     Precautions / Restrictions Precautions Precautions: Fall Precaution Comments: dementia Restrictions Weight Bearing Restrictions Per Provider Order: No      Mobility  Bed Mobility Overal bed mobility: Needs Assistance Bed Mobility: Supine to Sit     Supine to sit: Min assist, HOB elevated     General bed mobility comments: repeat verbal cues, followed fairly well, still needs effort and a hand hold to achieve task    Transfers Overall transfer level: Needs assistance Equipment used: 1 person hand held assist Transfers: Sit to/from Stand, Bed to chair/wheelchair/BSC Sit to Stand: Min assist   Step pivot transfers: Contact guard assist (cues for hand placement, completion of task, pt hesitant due to stress incontinence upon rising, mildly unsure of stepping)       General transfer comment: appears quite fatigued after step pivot to chair    Ambulation/Gait Ambulation/Gait assistance:  (not performed; too exerting at eval.)                Stairs            Wheelchair Mobility     Tilt Bed    Modified Rankin (Stroke Patients Only)       Balance                                             Pertinent Vitals/Pain Pain Assessment Pain Assessment: No/denies pain    Home Living Family/patient  expects to be discharged to:: Assisted living [facility])                   Additional Comments: Per chart review rapid progression of dementia over the past 12-18 months with recent transition to ALF in the past 12 months    Prior Function Prior Level of Function : Patient poor historian/Family not available               ADLs Comments: Chart review indicates decline in ADL performance due to cognitive deficits.     Extremity/Trunk Assessment   Upper Extremity Assessment Upper  Extremity Assessment: Difficult to assess due to impaired cognition;Generalized weakness    Lower Extremity Assessment Lower Extremity Assessment: Difficult to assess due to impaired cognition;Generalized weakness       Communication   Communication Communication: Difficulty communicating thoughts/reduced clarity of speech Following commands: Follows one step commands inconsistently;Follows one step commands with increased time Cueing Techniques: Verbal cues;Tactile cues  Cognition Arousal: Alert Behavior During Therapy: WFL for tasks assessed/performed, Restless Overall Cognitive Status: History of cognitive impairments - at baseline                                 General Comments: word salad        General Comments General comments (skin integrity, edema, etc.): BLE swelling    Exercises     Assessment/Plan    PT Assessment All further PT needs can be met in the next venue of care  PT Problem List Decreased activity tolerance;Decreased balance;Decreased mobility       PT Treatment Interventions      PT Goals (Current goals can be found in the Care Plan section)  Acute Rehab PT Goals PT Goal Formulation: All assessment and education complete, DC therapy    Frequency       Co-evaluation               AM-PAC PT "6 Clicks" Mobility  Outcome Measure Help needed turning from your back to your side while in a flat bed without using bedrails?: A Lot Help needed moving from lying on your back to sitting on the side of a flat bed without using bedrails?: A Lot Help needed moving to and from a bed to a chair (including a wheelchair)?: A Lot Help needed standing up from a chair using your arms (e.g., wheelchair or bedside chair)?: A Lot Help needed to walk in hospital room?: A Lot Help needed climbing 3-5 steps with a railing? : Total 6 Click Score: 11    End of Session Equipment Utilized During Treatment: Oxygen Activity Tolerance: Patient  tolerated treatment well;No increased pain;Patient limited by fatigue Patient left: in chair;with nursing/sitter in room Nurse Communication: Mobility status PT Visit Diagnosis: Other abnormalities of gait and mobility (R26.89);Difficulty in walking, not elsewhere classified (R26.2);Muscle weakness (generalized) (M62.81)    Time: 1610-9604 PT Time Calculation (min) (ACUTE ONLY): 17 min   Charges:   PT Evaluation $PT Eval Low Complexity: 1 Low   PT General Charges $$ ACUTE PT VISIT: 1 Visit        3:51 PM, 02/21/23 Rosamaria Lints, PT, DPT Physical Therapist - [facility]  [phone] (ASCOM)    Lynia Landry C 02/21/2023, 3:48 PM

## 2023-02-22 DIAGNOSIS — N390 Urinary tract infection, site not specified: Secondary | ICD-10-CM | POA: Diagnosis present

## 2023-02-22 DIAGNOSIS — K219 Gastro-esophageal reflux disease without esophagitis: Secondary | ICD-10-CM | POA: Diagnosis present

## 2023-02-22 DIAGNOSIS — G9340 Encephalopathy, unspecified: Secondary | ICD-10-CM

## 2023-02-22 DIAGNOSIS — J9621 Acute and chronic respiratory failure with hypoxia: Secondary | ICD-10-CM | POA: Diagnosis present

## 2023-02-22 DIAGNOSIS — Z515 Encounter for palliative care: Secondary | ICD-10-CM | POA: Diagnosis not present

## 2023-02-22 DIAGNOSIS — I509 Heart failure, unspecified: Secondary | ICD-10-CM | POA: Diagnosis present

## 2023-02-22 DIAGNOSIS — J810 Acute pulmonary edema: Secondary | ICD-10-CM | POA: Diagnosis not present

## 2023-02-22 DIAGNOSIS — I5033 Acute on chronic diastolic (congestive) heart failure: Principal | ICD-10-CM

## 2023-02-22 DIAGNOSIS — F32A Depression, unspecified: Secondary | ICD-10-CM | POA: Diagnosis present

## 2023-02-22 DIAGNOSIS — F05 Delirium due to known physiological condition: Secondary | ICD-10-CM | POA: Diagnosis present

## 2023-02-22 DIAGNOSIS — N179 Acute kidney failure, unspecified: Secondary | ICD-10-CM | POA: Diagnosis not present

## 2023-02-22 DIAGNOSIS — Y92122 Bedroom in nursing home as the place of occurrence of the external cause: Secondary | ICD-10-CM | POA: Diagnosis not present

## 2023-02-22 DIAGNOSIS — F03B4 Unspecified dementia, moderate, with anxiety: Secondary | ICD-10-CM

## 2023-02-22 DIAGNOSIS — G9349 Other encephalopathy: Secondary | ICD-10-CM | POA: Diagnosis present

## 2023-02-22 DIAGNOSIS — F0284 Dementia in other diseases classified elsewhere, unspecified severity, with anxiety: Secondary | ICD-10-CM | POA: Diagnosis present

## 2023-02-22 DIAGNOSIS — J9601 Acute respiratory failure with hypoxia: Secondary | ICD-10-CM

## 2023-02-22 DIAGNOSIS — B952 Enterococcus as the cause of diseases classified elsewhere: Secondary | ICD-10-CM | POA: Diagnosis present

## 2023-02-22 DIAGNOSIS — Z1621 Resistance to vancomycin: Secondary | ICD-10-CM | POA: Diagnosis present

## 2023-02-22 DIAGNOSIS — E669 Obesity, unspecified: Secondary | ICD-10-CM | POA: Diagnosis present

## 2023-02-22 DIAGNOSIS — W06XXXA Fall from bed, initial encounter: Secondary | ICD-10-CM | POA: Diagnosis present

## 2023-02-22 DIAGNOSIS — R296 Repeated falls: Secondary | ICD-10-CM | POA: Diagnosis present

## 2023-02-22 DIAGNOSIS — T502X5A Adverse effect of carbonic-anhydrase inhibitors, benzothiadiazides and other diuretics, initial encounter: Secondary | ICD-10-CM | POA: Diagnosis not present

## 2023-02-22 DIAGNOSIS — H6691 Otitis media, unspecified, right ear: Secondary | ICD-10-CM | POA: Diagnosis present

## 2023-02-22 DIAGNOSIS — L03116 Cellulitis of left lower limb: Secondary | ICD-10-CM | POA: Diagnosis present

## 2023-02-22 DIAGNOSIS — G309 Alzheimer's disease, unspecified: Secondary | ICD-10-CM | POA: Diagnosis present

## 2023-02-22 DIAGNOSIS — R1310 Dysphagia, unspecified: Secondary | ICD-10-CM | POA: Diagnosis present

## 2023-02-22 DIAGNOSIS — L03115 Cellulitis of right lower limb: Secondary | ICD-10-CM

## 2023-02-22 DIAGNOSIS — F0283 Dementia in other diseases classified elsewhere, unspecified severity, with mood disturbance: Secondary | ICD-10-CM | POA: Diagnosis present

## 2023-02-22 DIAGNOSIS — J189 Pneumonia, unspecified organism: Secondary | ICD-10-CM | POA: Diagnosis present

## 2023-02-22 DIAGNOSIS — Z66 Do not resuscitate: Secondary | ICD-10-CM | POA: Diagnosis present

## 2023-02-22 DIAGNOSIS — N3281 Overactive bladder: Secondary | ICD-10-CM | POA: Diagnosis present

## 2023-02-22 LAB — BASIC METABOLIC PANEL
Anion gap: 11 (ref 5–15)
BUN: 14 mg/dL (ref 8–23)
CO2: 26 mmol/L (ref 22–32)
Calcium: 8.5 mg/dL — ABNORMAL LOW (ref 8.9–10.3)
Chloride: 103 mmol/L (ref 98–111)
Creatinine, Ser: 1.15 mg/dL — ABNORMAL HIGH (ref 0.44–1.00)
GFR, Estimated: 49 mL/min — ABNORMAL LOW (ref >60)
Glucose, Bld: 99 mg/dL (ref 70–99)
Potassium: 3.6 mmol/L (ref 3.5–5.1)
Sodium: 140 mmol/L (ref 135–145)

## 2023-02-22 LAB — CK: Total CK: 354 U/L — ABNORMAL HIGH (ref 38–234)

## 2023-02-22 LAB — MAGNESIUM: Magnesium: 2 mg/dL (ref 1.7–2.4)

## 2023-02-22 MED ORDER — FUROSEMIDE 40 MG PO TABS
40.0000 mg | ORAL_TABLET | Freq: Every day | ORAL | Status: DC
  Administered 2023-02-22 – 2023-02-26 (×5): 40 mg via ORAL
  Filled 2023-02-22 (×5): qty 1

## 2023-02-22 NOTE — TOC CM/SW Note (Addendum)
Per chart review, patient lives at [facility]. CSW sent secure email to their executive director to see which is their preferred hospice agency. Will follow up with family once response received.  Charlynn Court, CSW [phone]  1:34 pm: [facility]'s preferred hospice agency is [place]. CSW left voicemail for son, Christiane Ha.  Charlynn Court, CSW [phone]  1:45 pm: Received another email from [employer] stating to use [facility]. Will address with son when he calls back.  Charlynn Court, CSW [phone]

## 2023-02-22 NOTE — Progress Notes (Signed)
Daily Progress Note   Patient Name: Lindsay Snyder       Date: 02/22/2023 DOB: [birth year]  Age: 78 y.o. MRN#: [id number] Attending Physician: Pennie Banter, DO Primary Care Physician: Doreene Nest, NP Admit Date: 02/20/2023  Reason for Consultation/Follow-up: Establishing goals of care  HPI/Brief Hospital Review: 78 y.o. female  with past medical history of Alzheimer's dementia, anxiety, depression, recurrent UTIs, chronic ambulation impairment-frequent falls, and GERD admitted on 02/20/2023 with unwitnessed fall at her assisted living facility [facility].   On presentation to the ED, patient's chest x-ray revealed right sided infiltrates of both left upper and lower lobe versus pulmonary congestion.  CT of head and neck revealed no acute fracture or dislocation but progressed opacification of the right middle ear and mastoid air cells as compared to last months.  Otitis media is suspected.   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  Patient was started on vancomycin, Flagyl, and ceftriaxone in the ED.   PMT was consulted to discuss boundaries and goals of care.  Subjective: Extensive chart review has been completed prior to meeting patient including labs, vital signs, imaging, progress notes, orders, and available advanced directive documents from current and previous encounters.    Visited with Ms. Kipp twice throughout morning, resting in bed comfortably. Review of chart it seems Ms. Veneziano able to fall asleep early this AM and continues to rest well. No family at bedside during time of visits.  Review of record later in day, TOC attempting communication with family, [employer] with preference on hospice agencies used in their facility.  Returned to bedside, Ms. Huezo awake, alert, remains confused and unable to participate in goals of care discussions. No family at bedside.  Called and spoke with daughter as TOC communicating with her. Provided updates, addressed her concerns. Plan remains for discharge back to [facility] with hospice services following. Plan set to attempt visit with daughter and son tomorrow morning at bedside.  PMT to continue to follow for ongoing needs and support.  Thank you for allowing the Palliative Medicine Team to assist in the care of this patient.  Total time:  25 minutes  Time spent includes: Detailed review of medical records (labs, imaging, vital  signs), medically appropriate exam (mental status, respiratory, cardiac, skin), discussed with treatment team, counseling and educating patient, family and staff, documenting clinical information, medication management and coordination of care.  Leeanne Deed, DNP, AGNP-C Palliative Medicine   Please contact Palliative Medicine Team phone at [phone] for questions and concerns.

## 2023-02-22 NOTE — ED Notes (Signed)
Pt resting w/ equal rise and fall of chest noted.

## 2023-02-22 NOTE — ED Notes (Signed)
Pt cont to be altered--she fed herself for lunch but was unable to do so for dinner.  Assisted pt with meals and po fluids. Changed pt as well.  She has pulled off her leads once and cont to take her oxygen tubing on/off, but is maintaining on RA. Door is open, frequent checks made, bed alarm on.  No tech avail for assistance.

## 2023-02-22 NOTE — TOC Initial Note (Signed)
Transition of Care [facility]) - Initial/Assessment Note    Patient Details  Name: Lindsay Snyder MRN: [id number] Date of Birth: 05-14-1945  Transition of [facility]) CM/SW Contact:    Margarito Liner, LCSW Phone Number: 02/22/2023, 2:44 PM  Clinical Narrative:   Received call back from daughter. She is agreeable to [facility]. She does not feel that patient needs any DME at this time. Patient is currently on acute oxygen so will follow for this potential need. [facility] liaison is aware. She has spoken with daughter and the executive director at the ALF. Per attending, patient will likely be ready for discharge in 1-2 days. Librarian, academic would prefer Monday if possible. CSW sent secure chat to attending physician to notify.              Expected Discharge Plan: Assisted Living (with hospice) Barriers to Discharge: Continued Medical Work up   Patient Goals and CMS Choice     Choice offered to / list presented to : Adult Children      Expected Discharge Plan and Services     Post Acute Care Choice: Resumption of Svcs/PTA Provider, Hospice Living arrangements for the past 2 months: Assisted Living Facility                                      Prior Living Arrangements/Services Living arrangements for the past 2 months: Assisted Living Facility Lives with:: Facility Resident Patient language and need for interpreter reviewed:: Yes Do you feel safe going back to the place where you live?: Yes      Need for Family Participation in Patient Care: Yes (Comment) Care giver support system in place?: Yes (comment)   Criminal Activity/Legal Involvement Pertinent to Current Situation/Hospitalization: No - Comment as needed  Activities of Daily Living   ADL Screening (condition at time of admission) Independently performs ADLs?: No Does the patient have a NEW difficulty with bathing/dressing/toileting/self-feeding that is expected to last >3 days?: No Does the patient have  a NEW difficulty with getting in/out of bed, walking, or climbing stairs that is expected to last >3 days?: No Does the patient have a NEW difficulty with communication that is expected to last >3 days?: No Is the patient deaf or have difficulty hearing?: No Does the patient have difficulty seeing, even when wearing glasses/contacts?: No Does the patient have difficulty concentrating, remembering, or making decisions?: Yes  Permission Sought/Granted Permission sought to share information with : Family Supports, Magazine features editor    Share Information with NAME: Nika Yazzie  Permission granted to share info w AGENCY: [facility], [facility]  Permission granted to share info w Relationship: Daughter  Permission granted to share info w Contact Information: [phone]  Emotional Assessment Appearance:: Appears stated age Attitude/Demeanor/Rapport: Unable to Assess Affect (typically observed): Unable to Assess Orientation: : Oriented to Self Alcohol / Substance Use: Not Applicable Psych Involvement: No (comment)  Admission diagnosis:  CHF (congestive heart failure) (HCC) [I50.9] Acute on chronic diastolic CHF (congestive heart failure) (HCC) [I50.33] Patient Active Problem List   Diagnosis Date Noted   Acute on chronic diastolic CHF (congestive heart failure) (HCC) 02/20/2023   CHF (congestive heart failure) (HCC) 02/20/2023   Goals of care, counseling/discussion 01/15/2023   Altered mental status 01/13/2023   History of rhabdomyolysis 01/13/2023   Acute encephalopathy 01/01/2023   UTI (urinary tract infection) 01/01/2023   Encephalopathy 01/01/2023  Lethargy 09/06/2022   Cancer (HCC) 05/02/2022   Vaginal itching 03/01/2022   AKI (acute kidney injury) (HCC) 02/05/2022   Dementia (HCC) 02/05/2022   Mood disorder (HCC) 02/05/2022   Overactive bladder 02/05/2022   Persistent cough for 3 weeks or longer 06/29/2021   Recurrent falls 06/29/2021    Osteoarthritis of hands, 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   Lower abdominal pain 02/03/2021   Urinary incontinence 02/03/2021   Memory changes 07/05/2020   Gastroesophageal reflux disease 07/05/2020   Generalized abdominal pain 03/01/2020   Environmental and seasonal allergies 01/14/2020   GAD (generalized anxiety disorder) 01/14/2020   Pain of left upper extremity 05/26/2019   Oral lesion 02/26/2019   Hemorrhoids 07/11/2017   Preventative health care 07/11/2017   Hyperlipidemia 07/10/2016   Insomnia due to stress 09/30/2014   Medicare annual wellness visit, subsequent 05/17/2014   Murmur, cardiac 05/17/2014   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   Rash and nonspecific skin eruption 04/15/2014   Osteopenia 04/15/2014   Obesity 04/15/2014   PCP:  Doreene Nest, NP Pharmacy:   [facility] - [place], [place] - [address] [place] [address] Phone: [phone] Fax: [phone]  [facility] [address] [place] [address] Phone: [phone] Fax: [phone]  [id number] [place], [place] - [address] AT [facility] [address] [place] [address] Phone: [phone] Fax: [phone]  [id number] - [place], [place] - [address] AT [facility] OF [address] [facility] [address] [place] [address] Phone: [phone] Fax: [phone]  [facility] - [place], [place] - [address] [place] [address] Phone: [phone] Fax: [phone]     Social Drivers of Health (SDOH) Social History: SDOH Screenings   Food Insecurity: Patient Unable To Answer (02/20/2023)  Housing: Patient Unable To Answer (02/20/2023)  Transportation Needs: Patient Unable To Answer (02/20/2023)  Utilities: Patient Unable To Answer (02/20/2023)  Alcohol Screen: Low  Risk  (05/17/2022)  Depression (PHQ2-9): Low Risk  (05/17/2022)  Recent Concern: Depression (PHQ2-9) - High Risk (03/01/2022)  Financial Resource Strain: Low Risk  (09/04/2022)  Physical Activity: Inactive (09/04/2022)  Social Connections: Patient Unable To Answer (02/20/2023)  Stress: No Stress Concern Present (09/04/2022)  Tobacco Use: Low Risk  (02/20/2023)   SDOH Interventions:     Readmission Risk Interventions     No data to display

## 2023-02-22 NOTE — ED Notes (Signed)
Pt appears to be resting in bed w/ equal rise and fall of chest noted.

## 2023-02-22 NOTE — ED Notes (Signed)
Pt noted to be 87% on RA, pt placed on 2L [place] at this time.

## 2023-02-22 NOTE — Progress Notes (Addendum)
Progress Note   Patient: Lindsay Snyder GNF:[id number] DOB: December 11, 1945 DOA: 02/20/2023     1 DOS: the patient was seen and examined on 02/22/2023   Brief hospital course: "KALINA MORABITO is a 78 y.o. female with medical history significant of Alzheimer's dementia, anxiety/depression, recurrent UTIs, chronic ambulation impairment frequent falls, sent from nursing home for evaluation of unwitnessed falls. "  See H&P for full HPI on admission & ED course.  Pt was admitted to the hospital for further evaluation and management of acute respiratory failure with hypoxia due to acute HFpEF decompensation (clinically more likely than pneumonia).  Pt was started on antibiotics for probable UTI and acute otitis media noted on imaging.  Further hospital course and management as outlined below.   Assessment and Plan:  Acute respiratory failure with hypoxia Acute on chronic HFpEF decompensation On admission, pt clinically fluid overloaded.   Recent Echo on 01/02/2023 showed preserved LVEF, indeterminant diastolic function, elevated pulmonary artery pressure, mild-moderate AS Net IO Since Admission: -1,046.46 mL [02/22/23 1411] Repeat chest x-ray this AM (1/23) showed almost complete resolution of the RUL opacity consistent with clearing edema, mild vascular congestion less than prior. Cr increased 1.12 >> 1.33 with IV Lasix (stopped 1/23) >> 1.15 --Start PO Lasix 40 mg daily --Strict Io's, daily weights --Monitor renal function & electrolytes  AKI - not POA, due to diuresis, mild. Cr increased from 1.12 >> 1.33 with initial diuresis. Baseline Cr appears 0.8-1.0 recently. --Monitor BMP --Caution with diuresis --Avoid other nephrotoxins & hypotension  Left lower extremity rash vs Cellulitis - images in chart from today 1/23 --On antibiotics as above.   --Monitor site closely for improvement  ?Dysphagia - given right-sided infiltrates on initial CXR, question if patient aspirates, given underlying  dementia --SLP consulted on admission, appreciate input on swallow & diet  Acute otitis media --Continue Rocephin  Recurrent UTI Urine culture growing enterococcus faecium -  --Follow urine culture susceptibilities --Continue Rocephin  Frequent falls -- ALF staff reported pt had 2 unwitnessed falls the day before admission.  Possibly increased weakness from CHF decompensation, ?infection --PT/OT evaluations - SNF recommended.  TOC for placement. --Fall precautions --Mgmt of UTI, OM and CHF as above   Alzheimers dementia - mental status seems at baseline. --Recruitment consultant as needed --Continue memantine and Aricept --Started low dose Seroquel at bedtime --Delirium precautions   Anxiety / Depression / Mood disorder --Continue lithium and SSRI Note that lithium level appears chronically subtherapeutic, will not change dose.  Defer to outpatient prescriber.      Subjective: Pt seen in ED holding for a bed, sleeping comfortably.  Mittens in place.  No complaints or acute events reported.    Physical Exam: Vitals:   02/22/23 1215 02/22/23 1228 02/22/23 1230 02/22/23 1232  BP:  120/65 120/65 120/65  Pulse: 73  71 70  Resp: 17  18 20   Temp:    97.8 F (36.6 C)  TempSrc:      SpO2: 100%  100% 98%   General exam: sleeping comfortably, no acute distress, confused HEENT: moist mucus membranes, hearing grossly normal  Respiratory system: CTAB diminished bases, no wheezes, rales or rhonchi, normal respiratory effort. On room air Cardiovascular system: normal S1/S2, RRR, 1+ BLE edema.   Gastrointestinal system: soft, NT, ND Central nervous system: limited exam due to somnolence Extremities: images below - distal anterior RLE with erythematous patch with no differential warmth (significantly less erythema distal LLE but some patchy noted), trace-1+ BLE edema Skin: legs as  above and pictured below. Otherwise skin dry intact normal temp and color Psychiatry: limited exam due to  somnolence  Images taken 1/23      Data Reviewed:  Notable labs --  Cr 1.12 >> 1.33 >> 1.15 Ca 8.5 CK 1463 >> 578 >> 354   Family Communication: None present on rounds. Will attempt to call as time allows.  Disposition: Status is: Inpatient Remains inpatient appropriate because: remains on IV antibiotics   Planned Discharge Destination: Plan to discharge to [facility] with hospice service      Time spent: 55 minutes including time at bedside and coordination of care including chart review, interpretation of diagnostic studies and clinical management.   Author: Pennie Banter, DO 02/22/2023 2:11 PM  For on call review [email or web address].

## 2023-02-22 NOTE — ED Notes (Signed)
Pt having to be redirected every 5 minutes to stop attempting to remove bilateral mittens.

## 2023-02-23 DIAGNOSIS — I5033 Acute on chronic diastolic (congestive) heart failure: Secondary | ICD-10-CM | POA: Diagnosis not present

## 2023-02-23 DIAGNOSIS — F03B4 Unspecified dementia, moderate, with anxiety: Secondary | ICD-10-CM | POA: Diagnosis not present

## 2023-02-23 DIAGNOSIS — R296 Repeated falls: Secondary | ICD-10-CM | POA: Diagnosis not present

## 2023-02-23 DIAGNOSIS — Z515 Encounter for palliative care: Secondary | ICD-10-CM | POA: Diagnosis not present

## 2023-02-23 DIAGNOSIS — J9601 Acute respiratory failure with hypoxia: Secondary | ICD-10-CM | POA: Diagnosis not present

## 2023-02-23 DIAGNOSIS — G9340 Encephalopathy, unspecified: Secondary | ICD-10-CM | POA: Diagnosis not present

## 2023-02-23 LAB — BASIC METABOLIC PANEL
Anion gap: 11 (ref 5–15)
BUN: 15 mg/dL (ref 8–23)
CO2: 27 mmol/L (ref 22–32)
Calcium: 8.8 mg/dL — ABNORMAL LOW (ref 8.9–10.3)
Chloride: 102 mmol/L (ref 98–111)
Creatinine, Ser: 0.91 mg/dL (ref 0.44–1.00)
GFR, Estimated: >60 mL/min (ref >60)
Glucose, Bld: 96 mg/dL (ref 70–99)
Potassium: 4 mmol/L (ref 3.5–5.1)
Sodium: 140 mmol/L (ref 135–145)

## 2023-02-23 LAB — URINE CULTURE: Culture: 60000 — AB

## 2023-02-23 LAB — CK: Total CK: 269 U/L — ABNORMAL HIGH (ref 38–234)

## 2023-02-23 MED ORDER — NITROFURANTOIN MONOHYD MACRO 100 MG PO CAPS
100.0000 mg | ORAL_CAPSULE | Freq: Two times a day (BID) | ORAL | Status: AC
  Administered 2023-02-23 – 2023-02-26 (×6): 100 mg via ORAL
  Filled 2023-02-23 (×6): qty 1

## 2023-02-23 MED ORDER — AMOXICILLIN-POT CLAVULANATE 875-125 MG PO TABS
1.0000 | ORAL_TABLET | Freq: Two times a day (BID) | ORAL | Status: AC
  Administered 2023-02-23 – 2023-02-25 (×4): 1 via ORAL
  Filled 2023-02-23 (×4): qty 1

## 2023-02-23 MED ORDER — TRAMADOL HCL 50 MG PO TABS
50.0000 mg | ORAL_TABLET | Freq: Three times a day (TID) | ORAL | Status: DC | PRN
  Administered 2023-02-23: 50 mg via ORAL
  Filled 2023-02-23: qty 1

## 2023-02-23 NOTE — ED Notes (Signed)
Pt is very confused and uncooperative, not willing to keep devices on for vital signs to be obtained. Please see flowsheets for the vitals obtained at 2230.

## 2023-02-23 NOTE — Progress Notes (Signed)
                                                                                                                                                                                                           Daily Progress Note   Patient Name: Lindsay Snyder       Date: 02/23/2023 DOB: Apr 27, 1945  Age: 78 y.o. MRN#: [id number] Attending Physician: Pennie Banter, DO Primary Care Physician: Doreene Nest, NP Admit Date: 02/20/2023  Reason for Consultation/Follow-up: Establishing goals of care  HPI/Brief Hospital Review: 78 y.o. female  with past medical history of Alzheimer's dementia, anxiety, depression, recurrent UTIs, chronic ambulation impairment-frequent falls, and GERD admitted on 02/20/2023 with unwitnessed fall at her assisted living facility [facility].   On presentation to the ED, patient's chest x-ray revealed right sided infiltrates of both left upper and lower lobe versus pulmonary congestion.  CT of head and neck revealed no acute fracture or dislocation but progressed opacification of the right middle ear and mastoid air cells as compared to last months.  Otitis media is suspected.   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  Patient was started on vancomycin, Flagyl, and ceftriaxone in the ED.   PMT was consulted to discuss boundaries and goals of care.  Subjective: Extensive chart review has been completed prior to meeting patient including labs, vital signs, imaging, progress notes, orders, and available advanced directive documents from current and previous encounters.    Visited with Lindsay Snyder at her bedside. She is resting comfortably in bed, does not acknowledge my presence in room. No family at bedside during time of visit.  Per chart review, plan remains for Lindsay Snyder to return to her facility on Monday with hospice services following.  No ongoing acute  palliative needs as goals are clear with clear plan in place. PMT will step back from daily visits. Please reengage as appropriate.  Thank you for allowing the Palliative Medicine Team to assist in the care of this patient.  Total time:  25 minutes  Time spent includes: Detailed review of medical records (labs, imaging, vital signs), medically appropriate exam (mental status, respiratory, cardiac, skin), discussed with treatment team, counseling and educating patient, family and staff, documenting clinical information, medication management and coordination of care.  Leeanne Deed, DNP, AGNP-C Palliative Medicine   Please contact Palliative Medicine Team phone at [phone] for questions and concerns.

## 2023-02-23 NOTE — ED Notes (Addendum)
This Registered Nurse (RN) assumed responsibility for the care of the assigned patient at 2301 on 02/22/23. All nursing tasks, documentation, and responsibilities prior to this time are not the responsibility of this RN. Any assessments, interventions, or documentation completed before this time are not within the scope of this RN's duties.  Effective 2301 on 02/22/23, this RN is now accountable for all aspects of patient care, including but not limited to assessments, interventions, documentation, medication administration, and care coordination. All future tasks and documentation will be managed and completed by this RN from this time until care handoff at 0700 on 02/22/22.

## 2023-02-23 NOTE — ED Notes (Signed)
Transfer of care to writer RN @ [phone] prior POC complete or not completed by day shift primary RN

## 2023-02-23 NOTE — Progress Notes (Addendum)
Progress Note   Patient: Lindsay Snyder ZOX:[id number] DOB: July 25, 1945 DOA: 02/20/2023     2 DOS: the patient was seen and examined on 02/23/2023   Brief hospital course: "[name] is a 78 y.o. female with medical history significant of Alzheimer's dementia, anxiety/depression, recurrent UTIs, chronic ambulation impairment frequent falls, sent from nursing home for evaluation of unwitnessed falls. "  See H&P for full HPI on admission & ED course.  Pt was admitted to the hospital for further evaluation and management of acute respiratory failure with hypoxia due to acute HFpEF decompensation (clinically more likely than pneumonia).  Pt was started on antibiotics for probable UTI and acute otitis media noted on imaging.  Further hospital course and management as outlined below.   Assessment and Plan:  Acute respiratory failure with hypoxia Acute on chronic HFpEF decompensation On admission, pt clinically fluid overloaded.   Recent Echo on 01/02/2023 showed preserved LVEF, indeterminant diastolic function, elevated pulmonary artery pressure, mild-moderate AS Net IO Since Admission: -1,046.46 mL [02/23/23 1555] Repeat chest x-ray this AM (1/23) showed almost complete resolution of the RUL opacity consistent with clearing edema, mild vascular congestion less than prior. Cr increased 1.12 >> 1.33 with IV Lasix (stopped 1/23) >> 1.15 >> 0.91 --Continue PO Lasix 40 mg daily --Strict Io's, daily weights --Monitor renal function & electrolytes  AKI - not POA, due to diuresis, mild. Cr increased from 1.12 >> 1.33 with initial diuresis >> 1.15 >> 0.91. Baseline Cr appears 0.8-1.0 recently. --Monitor BMP --Avoid nephrotoxins & hypotension  Left lower extremity rash vs Cellulitis - images in chart from today 1/23 --On antibiotics as above.   --Monitor site closely for improvement  ?Dysphagia - given right-sided infiltrates on initial CXR, question if patient aspirates, given underlying  dementia --SLP consulted on admission, appreciate input on swallow & diet  Acute otitis media --Change IV Rocephin >> PO Augmentin x 2 more days to complete 5 day course.  Recurrent UTI Urine culture grew enterococcus faecium sensitive only to Macrobid  --Stop Rocephin --Start Macrobid, treat x 3 days  Frequent falls -- ALF staff reported pt had 2 unwitnessed falls the day before admission.  Possibly increased weakness from CHF decompensation, ?infection --PT/OT evaluations - SNF recommended.  TOC for placement. --Fall precautions --Mgmt of UTI, OM and CHF as above   Alzheimers dementia - mental status seems at baseline. --Recruitment consultant as needed --Continue memantine and Aricept --Started low dose Seroquel at bedtime --Delirium precautions   Anxiety / Depression / Mood disorder --Continue lithium and SSRI Note that lithium level appears chronically subtherapeutic, will not change dose.  Defer to outpatient prescriber.      Subjective: Pt seen in ED holding for a bed, sleeping comfortably.  Mittens in place.  No complaints or acute events reported.    Physical Exam: Vitals:   02/22/23 2215 02/22/23 2230 02/23/23 0912 02/23/23 1544  BP:  124/70 119/74 (!) 108/57  Pulse: 67 73 72 77  Resp: 13 18 20 18   Temp:   (!) 97.4 F (36.3 C) 98.3 F (36.8 C)  TempSrc:   Oral   SpO2: 92% 97% 98% 96%   General exam: sleeping comfortably, no acute distress, confused HEENT: moist mucus membranes, hearing grossly normal  Respiratory system: CTAB diminished bases, no wheezes, rales or rhonchi, normal respiratory effort. On room air Cardiovascular system: normal S1/S2, RRR, 1+ BLE edema.   Gastrointestinal system: soft, NT, ND Central nervous system: limited exam due to somnolence Extremities: images below -  distal anterior RLE with erythematous patch with no differential warmth (significantly less erythema distal LLE but some patchy noted), trace-1+ BLE edema Skin: legs as above and  pictured below. Otherwise skin dry intact normal temp and color Psychiatry: limited exam due to somnolence  Images taken 1/23      Data Reviewed:  Notable labs --  Cr 1.12 >> 1.33 >> 1.15 Ca 8.5 CK 1463 >> 578 >> 354   Family Communication: None present on rounds. Will attempt to call as time allows.  Disposition: Status is: Inpatient  Remains inpatient appropriate because: awaiting return to facility with hospice.  Per TOC, facility cannot take pt until Monday 1/27.   Medically stable for d/c since 1/24.   Planned Discharge Destination: Plan to discharge to [facility] with hospice service      Time spent: 40 minutes   Author: Pennie Banter, DO 02/23/2023 3:55 PM  For on call review [email or web address].

## 2023-02-24 DIAGNOSIS — G9340 Encephalopathy, unspecified: Secondary | ICD-10-CM | POA: Diagnosis not present

## 2023-02-24 DIAGNOSIS — I5033 Acute on chronic diastolic (congestive) heart failure: Secondary | ICD-10-CM | POA: Diagnosis not present

## 2023-02-24 DIAGNOSIS — F03B4 Unspecified dementia, moderate, with anxiety: Secondary | ICD-10-CM | POA: Diagnosis not present

## 2023-02-24 NOTE — Plan of Care (Signed)
Problem: Safety: Goal: Ability to remain free from injury will improve Outcome: Progressing   Problem: Skin Integrity: Goal: Risk for impaired skin integrity will decrease Outcome: Progressing   Problem: Clinical Measurements: Goal: Ability to maintain clinical measurements within normal limits will improve Outcome: Progressing

## 2023-02-24 NOTE — ED Notes (Signed)
This NT and RN Morrie Sheldon changed patient. Patient was wet. Patient has on a clean brief and a clean chux.

## 2023-02-24 NOTE — Progress Notes (Signed)
Progress Note   Patient: Lindsay Snyder ZOX:[id number] DOB: 1945-03-14 DOA: 02/20/2023     3 DOS: the patient was seen and examined on 02/24/2023   Brief hospital course: "DAEJA HELDERMAN is a 78 y.o. female with medical history significant of Alzheimer's dementia, anxiety/depression, recurrent UTIs, chronic ambulation impairment frequent falls, sent from nursing home for evaluation of unwitnessed falls. "  See H&P for full HPI on admission & ED course.  Pt was admitted to the hospital for further evaluation and management of acute respiratory failure with hypoxia due to acute HFpEF decompensation (clinically more likely than pneumonia).  Pt was started on antibiotics for probable UTI and acute otitis media noted on imaging.  Further hospital course and management as outlined below.   Assessment and Plan:  Acute respiratory failure with hypoxia Acute on chronic HFpEF decompensation On admission, pt clinically fluid overloaded.   Recent Echo on 01/02/2023 showed preserved LVEF, indeterminant diastolic function, elevated pulmonary artery pressure, mild-moderate AS Net IO Since Admission: -1,046.46 mL [02/24/23 1404] Repeat chest x-ray this AM (1/23) showed almost complete resolution of the RUL opacity consistent with clearing edema, mild vascular congestion less than prior. Cr increased 1.12 >> 1.33 with IV Lasix (stopped 1/23) >> 1.15 >> 0.91 --Continue PO Lasix 40 mg daily --Strict Io's, daily weights --Monitor renal function & electrolytes  AKI - not POA, due to diuresis, mild. Cr increased from 1.12 >> 1.33 with initial diuresis >> 1.15 >> 0.91. Baseline Cr appears 0.8-1.0 recently. --Monitor BMP --Avoid nephrotoxins & hypotension  Left lower extremity rash vs Cellulitis - images in chart from today 1/23 --On antibiotics as above.   --Monitor site closely for improvement  ?Dysphagia - given right-sided infiltrates on initial CXR, question if patient aspirates, given underlying  dementia --SLP consulted on admission, appreciate input on swallow & diet  Acute otitis media --Change IV Rocephin >> PO Augmentin x 2 more days to complete 5 day course.  Recurrent UTI Urine culture grew enterococcus faecium sensitive only to Macrobid  --Stop Rocephin --Start Macrobid, treat x 3 days  Frequent falls -- ALF staff reported pt had 2 unwitnessed falls the day before admission.  Possibly increased weakness from CHF decompensation, ?infection --PT/OT evaluations - SNF recommended.  TOC for placement. --Fall precautions --Mgmt of UTI, OM and CHF as above   Alzheimers dementia - mental status seems at baseline. --Recruitment consultant as needed --Continue memantine and Aricept --Started low dose Seroquel at bedtime --Delirium precautions   Anxiety / Depression / Mood disorder --Continue lithium and SSRI Note that lithium level appears chronically subtherapeutic, will not change dose.  Defer to outpatient prescriber.       Subjective: Pt seen in ED holding for a bed, awake, fully unclothed.  Denies pain, feeling sick or other complaints.  No reports of acute events or issues from RN staff.  Pt has been boarding in the ED since admission.   Physical Exam: Vitals:   02/24/23 0620 02/24/23 0630 02/24/23 0645 02/24/23 1047  BP:    120/82  Pulse: 65 76 66 68  Resp:    17  Temp:    99 F (37.2 C)  TempSrc:    Axillary  SpO2: 100% 99% 100% 100%   General exam: sleeping comfortably, no acute distress, confused Respiratory system: Normal respiratory effort. On room air lungs clear Cardiovascular system: normal S1/S2, RRR, resolved BLE edema.   Gastrointestinal system: soft, NT, ND Central nervous system: normal speech but word salade, A&O to self, grossly  non-focal Extremities: resolved BLE edema Skin: distal LLE erythema has significantly faded Psychiatry: normal mood and affect, abnormal judgment and insight due to dementia    Data Reviewed:  Notable labs --  Cr  1.12 >> 1.33 >> 1.15 >> 0.91  CK 1463 >> 578 >> 354 >> 269   Family Communication: None present on rounds. Will attempt to call as time allows.  Disposition: Status is: Inpatient  Remains inpatient appropriate because: awaiting return to facility with hospice.  Per TOC, facility cannot take pt until Monday 1/27.   Medically stable for d/c since 1/24.   Planned Discharge Destination: Plan to discharge to [facility] with hospice service      Time spent: 35 minutes   Author: Pennie Banter, DO 02/24/2023 2:04 PM  For on call review [email or web address].

## 2023-02-24 NOTE — ED Notes (Signed)
Advised nurse that patient has ready bed

## 2023-02-25 ENCOUNTER — Encounter: Payer: Self-pay | Admitting: *Deleted

## 2023-02-25 DIAGNOSIS — I5033 Acute on chronic diastolic (congestive) heart failure: Secondary | ICD-10-CM | POA: Diagnosis not present

## 2023-02-25 DIAGNOSIS — F03B4 Unspecified dementia, moderate, with anxiety: Secondary | ICD-10-CM | POA: Diagnosis not present

## 2023-02-25 LAB — CULTURE, BLOOD (ROUTINE X 2)
Culture: NO GROWTH
Culture: NO GROWTH

## 2023-02-25 MED ORDER — NITROFURANTOIN MONOHYD MACRO 100 MG PO CAPS: 100.0000 mg | ORAL_CAPSULE | Freq: Two times a day (BID) | ORAL | 0 refills | Status: AC

## 2023-02-25 MED ORDER — QUETIAPINE FUMARATE 25 MG PO TABS: 25.0000 mg | ORAL_TABLET | Freq: Every day | ORAL | Status: AC

## 2023-02-25 MED ORDER — POLYETHYLENE GLYCOL 3350 17 G PO PACK
17.0000 g | PACK | Freq: Every day | ORAL | Status: DC
  Administered 2023-02-25: 17 g via ORAL
  Filled 2023-02-25: qty 1

## 2023-02-25 MED ORDER — FUROSEMIDE 40 MG PO TABS: 40.0000 mg | ORAL_TABLET | Freq: Every day | ORAL | Status: AC

## 2023-02-25 MED ORDER — BISACODYL 5 MG PO TBEC
5.0000 mg | DELAYED_RELEASE_TABLET | Freq: Once | ORAL | Status: AC
  Administered 2023-02-25: 5 mg via ORAL
  Filled 2023-02-25: qty 1

## 2023-02-25 MED ORDER — SENNOSIDES-DOCUSATE SODIUM 8.6-50 MG PO TABS
1.0000 | ORAL_TABLET | Freq: Two times a day (BID) | ORAL | Status: DC
  Administered 2023-02-25 (×2): 1 via ORAL
  Filled 2023-02-25 (×2): qty 1

## 2023-02-25 NOTE — Discharge Summary (Signed)
Physician Discharge Summary   Patient: Lindsay Snyder MRN: [id number] DOB: 1945-05-12  Admit date:     02/20/2023  Discharge date: 02/26/2023  Discharge Physician: Pennie Banter   PCP: Doreene Nest, NP    02/26/23 AM addendum -- discharge was cancelled yesterday. Per TOC, facility needed DME delivery including hospital bed prior to discharge.  Pt remains medically stable for discharge today.   Recommendations at discharge:   Follow up with Hospice team  Follow up with Primary Care Monitor CBC, BMP, Mg in follow up, if consistent with changes in goals of care  Discharge Diagnoses: Principal Problem:   CHF (congestive heart failure) (HCC) Active Problems:   Dementia (HCC)   Acute encephalopathy   Altered mental status   Acute on chronic diastolic CHF (congestive heart failure) (HCC)  Resolved Problems:   * No resolved hospital problems. *  Hospital Course:  "Lindsay Snyder is a 78 y.o. female with medical history significant of Alzheimer's dementia, anxiety/depression, recurrent UTIs, chronic ambulation impairment frequent falls, sent from nursing home for evaluation of unwitnessed falls. "  See H&P for full HPI on admission & ED course.   Pt was admitted to the hospital for further evaluation and management of acute respiratory failure with hypoxia due to acute HFpEF decompensation (clinically more likely than pneumonia).  Pt was started on antibiotics for probable UTI and acute otitis media noted on imaging.   Further hospital course and management as outlined below.  02/25/23 -- pt remains clinically improved and medically stable for discharge today. --Volume status remains stable post-diuresis, on oral Lasix --Left lower extremity cellulitis is improved --Pt remain on PO macrobid for UTI which can be completed at ALF --Hospice to follow patient at ALF post-discharge   Assessment and Plan:  Acute respiratory failure with hypoxia - resolved Acute on chronic  HFpEF decompensation - resolved On admission, pt clinically fluid overloaded.   Recent Echo on 01/02/2023 showed preserved LVEF, indeterminant diastolic function, elevated pulmonary artery pressure, mild-moderate AS Diuresed with IV lasix until Cr increased. --Continue PO Lasix 40 mg daily --Strict Io's, daily weights --Monitor renal function & electrolytes   AKI - not POA, due to diuresis, mild. Resolved Cr increased from 1.12 >> 1.33 with initial diuresis >> 1.15 >> 0.91. Baseline Cr appears 0.8-1.0 recently. --Monitor BMP --Avoid nephrotoxins & hypotension   Left lower extremity rash vs Cellulitis - images in chart from today 1/23. Resolved --Completed antibiotics for this (Rocephin >> Augmentin) --Monitor site closely for improvement   ?Dysphagia - given right-sided infiltrates on initial CXR, question if patient aspirates, given underlying dementia --SLP consulted on admission, appreciate input on swallow & diet --Pt tolerating dysphagia 3 / mechanical diet, thin liquids   Acute otitis media --Completed 5 day course (IV Rocephin >> PO Augmentin) --Monitor clinically. Consider ENT referral if needed & in line with goals of care   Recurrent UTI Urine culture grew enterococcus faecium sensitive only to Macrobid  --Stop Rocephin --Changed to Macrobid, treat x 3 days   Frequent falls -- ALF staff reported pt had 2 unwitnessed falls the day before admission.  Possibly increased weakness from CHF decompensation, ?infection --PT/OT evaluations - SNF recommended.  TOC for placement. --Fall precautions --Mgmt of UTI, OM and CHF as above   Alzheimers dementia - mental status seems at baseline. --Recruitment consultant as needed --Continue memantine and Aricept --Started low dose Seroquel at bedtime --Delirium precautions   Anxiety / Depression / Mood disorder --Continue lithium and SSRI  Note that lithium level appears chronically subtherapeutic, will not change dose.  Defer to outpatient  prescriber.           Consultants: Palliative Care  Procedures performed: None   Disposition: Assisted living with hospice  Diet recommendation:  Discharge Diet Orders (From admission, onward)     Start     Ordered   02/25/23 0000  Diet -dysphagia 3, thin liquids      02/25/23 0936            DISCHARGE MEDICATION: Allergies as of 02/25/2023       Reactions   Crestor [rosuvastatin Calcium] Rash, Other (See Comments)   Myalgia   Tylenol [acetaminophen] Rash        Medication List     STOP taking these medications    sulfamethoxazole-trimethoprim 800-160 MG tablet Commonly known as: BACTRIM DS       TAKE these medications    cetirizine 10 MG tablet Commonly known as: ZYRTEC Take 10 mg by mouth daily.   diclofenac Sodium 1 % Gel Commonly known as: VOLTAREN Apply 4 g topically 3 (three) times daily as needed (pain). (Apply to affected area of legs)   donepezil 5 MG tablet Commonly known as: ARICEPT Take 5 mg by mouth at bedtime.   DULoxetine 20 MG capsule Commonly known as: CYMBALTA TAKE 2 CAPSULES(40 MG) BY MOUTH DAILY FOR ANXIETY   furosemide 40 MG tablet Commonly known as: LASIX Take 1 tablet (40 mg total) by mouth daily.   lithium carbonate 150 MG capsule Take 150 mg by mouth daily.   memantine 5 MG tablet Commonly known as: NAMENDA Take 2 tablets (10 mg total) by mouth daily. For memory   mirtazapine 15 MG tablet Commonly known as: REMERON Take 15 mg by mouth at bedtime.   nitrofurantoin (macrocrystal-monohydrate) 100 MG capsule Commonly known as: MACROBID Take 1 capsule (100 mg total) by mouth every 12 (twelve) hours for 2 days.   ondansetron 4 MG disintegrating tablet Commonly known as: ZOFRAN-ODT Take 1 tablet (4 mg total) by mouth every 8 (eight) hours as needed for nausea or vomiting.   oxybutynin 15 MG 24 hr tablet Commonly known as: DITROPAN XL TAKE 1 TABLET(15 MG) BY MOUTH AT BEDTIME FOR OVERACTIVE BLADDER    QUEtiapine 25 MG tablet Commonly known as: SEROQUEL Take 1 tablet (25 mg total) by mouth at bedtime.   traZODone 100 MG tablet Commonly known as: DESYREL Take 100 mg by mouth at bedtime.        Discharge Exam: Filed Weights   02/24/23 1709 02/25/23 0500  Weight: 95.7 kg 96.7 kg   General exam: awake, alert, no acute distress, confused HEENT: moist mucus membranes, hearing grossly normal  Respiratory system: CTAB, no wheezes, rales or rhonchi, normal respiratory effort. Cardiovascular system: normal S1/S2,  RRR,  no pedal edema.   Gastrointestinal system: soft, NT, ND, no HSM felt, +bowel sounds. Central nervous system: A&O x self. no gross focal neurologic deficits, normal speech Extremities: moves all, no edema, normal tone Skin: distal LLE erythema fading, nearly resolved Psychiatry: normal mood, congruent affect, abnormal judgement and insight due to dementia   Condition at discharge: stable  The results of significant diagnostics from this hospitalization (including imaging, microbiology, ancillary and laboratory) are listed below for reference.   Imaging Studies: DG Chest 1 View Result Date: 02/21/2023 CLINICAL DATA:  [id number] with congestive heart failure. EXAM: CHEST  1 VIEW COMPARISON:  Portable chest yesterday at 6:38 a.m. FINDINGS: 6:52 a.m. the  heart is enlarged, similar to the previous exam. There is mild central vascular prominence but less than previously with resolution of interstitial edema. Prior patchy airspace infiltrate in the right upper lobe has almost completely resolved consistent with clearing edema or pneumonia. The remaining lungs are clear.  No pleural effusion is seen. Stable mediastinum with aortic tortuosity and atherosclerosis. No new osseous findings.  Chronic resorption of left humeral head. IMPRESSION: 1. Cardiomegaly with mild central vascular prominence but less than previously, with resolution of interstitial edema. 2. Prior patchy airspace  infiltrate in the right upper lobe has almost completely resolved consistent with clearing edema or pneumonia. 3. No further new findings.  Otherwise clear lungs. Electronically Signed   By: Almira Bar M.D.   On: 02/21/2023 07:09   DG Knee 1-2 Views Right Result Date: 02/20/2023 CLINICAL DATA:  Right knee pain and redness. EXAM: RIGHT KNEE - 1-2 VIEW COMPARISON:  Right knee x-rays dated January 14, 2022. FINDINGS: No acute fracture or dislocation. New small joint effusion. Progressive tricompartmental degenerative changes, moderate in the medial compartment. Diffuse soft tissue swelling. IMPRESSION: 1. 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 2. New small joint effusion. Electronically Signed   By: Obie Dredge M.D.   On: 02/20/2023 14:22   CT Cervical Spine Wo Contrast Result Date: 02/20/2023 CLINICAL DATA:  77 year old female status post unwitnessed fall. UTI. EXAM: CT CERVICAL SPINE WITHOUT CONTRAST TECHNIQUE: Multidetector CT imaging of the cervical spine was performed without intravenous contrast. Multiplanar CT image reconstructions were also generated. RADIATION DOSE REDUCTION: This exam was performed according to the departmental dose-optimization program which includes automated exposure control, adjustment of the mA and/or kV according to patient size and/or use of iterative reconstruction technique. COMPARISON:  Head CT today.  Prior cervical spine CT 2023/01/25. FINDINGS: Motion artifact similar to but more pronounced than on 01/25/23. Alignment: Chronic straightening of cervical lordosis. Cervicothoracic junction alignment is within normal limits. Posterior element alignment appears maintained bilaterally. Skull base and vertebrae: Craniocervical junction alignment appears stable, within normal limits. Motion artifact most pronounced C1 through C3. No acute osseous abnormality identified. Soft tissues and spinal canal: No prevertebral fluid or  swelling. No visible canal hematoma. Stable visible noncontrast neck soft tissues including incidental retropharyngeal course of both carotids. Disc levels: Grossly stable when allowing for motion with no strong evidence of cervical spinal stenosis. Upper chest: New confluent right upper lobe peribronchial opacity since 01-25-2023 (series 4, image 82). Underlying bilateral pulmonary septal thickening, with otherwise stable left upper lobe anterior curvilinear scarring. Negative visible trachea. IMPRESSION: 1. Motion degraded exam with no acute traumatic injury identified in the cervical spine. 2. New confluent right upper lobe opacity since December suspicious for Bronchopneumonia. Underlying pulmonary septal thickening raising the possibility of superimposed pulmonary edema. Electronically Signed   By: Odessa Fleming M.D.   On: 02/20/2023 07:09   CT HEAD WO CONTRAST ( ) Result Date: 02/20/2023 CLINICAL DATA:  78 year old female status post unwitnessed fall. UTI. EXAM: CT HEAD WITHOUT CONTRAST TECHNIQUE: Contiguous axial images were obtained from the base of the skull through the vertex without intravenous contrast. RADIATION DOSE REDUCTION: This exam was performed according to the departmental dose-optimization program which includes automated exposure control, adjustment of the mA and/or kV according to patient size and/or use of iterative reconstruction technique. COMPARISON:  Brain MRI 01/02/2023.  Head CT 01/13/2023. FINDINGS: Brain: Study is intermittently degraded by motion artifact despite repeated imaging attempts. Stable cerebral volume with no ventriculomegaly, midline shift, intracranial mass effect.  No acute intracranial hemorrhage identified. Grossly stable gray-white differentiation with no acute cortically based infarct identified. Vascular: No suspicious intracranial vascular hyperdensity. Skull: No fracture identified, mild motion artifact despite repeated imaging attempts. Sinuses/Orbits: Right  mastoid and middle ear opacification have increased since last month, with negative appearance of the nasopharynx at that time. Left tympanic cavities, left mastoids and paranasal sinuses remain well aerated. Other: No orbit or scalp soft tissue injury identified, mild motion artifact. IMPRESSION: 1. Motion degraded exam despite repeated imaging attempts. No acute traumatic injury identified. 2. Progressed opacification of the right middle ear and mastoid air cells since last month. Consider Otitis Media with reactive mastoid effusion. 3. Grossly stable non contrast CT appearance of the brain. Electronically Signed   By: Odessa Fleming M.D.   On: 02/20/2023 07:06   DG Chest Port 1 View Result Date: 02/20/2023 CLINICAL DATA:  78 year old female with possible sepsis. Unwitnessed fall. EXAM: PORTABLE CHEST 1 VIEW COMPARISON:  Chest x-ray 01/13/2023. FINDINGS: Lung volumes are normal. Widespread interstitial prominence and patchy peribronchial cuffing noted throughout the lungs bilaterally. There are also areas of ill-defined opacities concerning for developing airspace disease, most evident in the right upper lobe, likely reflective of bronchopneumonia. No pleural effusions. No pneumothorax. No evidence of pulmonary edema. Heart size appears borderline enlarged. The patient is rotated to the right on today's exam, resulting in distortion of the mediastinal contours and reduced diagnostic sensitivity and specificity for mediastinal pathology. IMPRESSION: 1. The appearance of the chest is concerning for probable bronchitis and right upper lobe bronchopneumonia, as above. Electronically Signed   By: Trudie Reed M.D.   On: 02/20/2023 06:52    Microbiology: Results for orders placed or performed during the hospital encounter of 02/20/23  Resp panel by RT-PCR (RSV, Flu A&B, Covid) Anterior Nasal Swab     Status: None   Collection Time: 02/20/23  6:28 AM   Specimen: Anterior Nasal Swab  Result Value Ref Range Status    SARS Coronavirus 2 by RT PCR NEGATIVE NEGATIVE Final    Comment: (NOTE) SARS-CoV-2 target nucleic acids are NOT DETECTED.  The SARS-CoV-2 RNA is generally detectable in upper respiratory specimens during the acute phase of infection. The lowest concentration of SARS-CoV-2 viral copies this assay can detect is 138 copies/mL. A negative result does not preclude SARS-Cov-2 infection and should not be used as the sole basis for treatment or other patient management decisions. A negative result may occur with  improper specimen collection/handling, submission of specimen other than nasopharyngeal swab, presence of viral mutation(s) within the areas targeted by this assay, and inadequate number of viral copies(<138 copies/mL). A negative result must be combined with clinical observations, patient history, and epidemiological information. The expected result is Negative.  Fact Sheet for Patients:  BloggerCourse.com  Fact Sheet for Healthcare Providers:  SeriousBroker.it  This test is no t yet approved or cleared by the [place] FDA and  has been authorized for detection and/or diagnosis of SARS-CoV-2 by FDA under an Emergency Use Authorization (EUA). This EUA will remain  in effect (meaning this test can be used) for the duration of the COVID-19 declaration under Section 564(b)(1) of the Act, 21 U.S.C.section 360bbb-3(b)(1), unless the authorization is terminated  or revoked sooner.       Influenza A by PCR NEGATIVE NEGATIVE Final   Influenza B by PCR NEGATIVE NEGATIVE Final    Comment: (NOTE) The Xpert Xpress SARS-CoV-2/FLU/RSV plus assay is intended as an aid in the diagnosis of influenza from  Nasopharyngeal swab specimens and should not be used as a sole basis for treatment. Nasal washings and aspirates are unacceptable for Xpert Xpress SARS-CoV-2/FLU/RSV testing.  Fact Sheet for  Patients: BloggerCourse.com  Fact Sheet for Healthcare Providers: SeriousBroker.it  This test is not yet approved or cleared by the [place] FDA and has been authorized for detection and/or diagnosis of SARS-CoV-2 by FDA under an Emergency Use Authorization (EUA). This EUA will remain in effect (meaning this test can be used) for the duration of the COVID-19 declaration under Section 564(b)(1) of the Act, 21 U.S.C. section 360bbb-3(b)(1), unless the authorization is terminated or revoked.     Resp Syncytial Virus by PCR NEGATIVE NEGATIVE Final    Comment: (NOTE) Fact Sheet for Patients: BloggerCourse.com  Fact Sheet for Healthcare Providers: SeriousBroker.it  This test is not yet approved or cleared by the [place] FDA and has been authorized for detection and/or diagnosis of SARS-CoV-2 by FDA under an Emergency Use Authorization (EUA). This EUA will remain in effect (meaning this test can be used) for the duration of the COVID-19 declaration under Section 564(b)(1) of the Act, 21 U.S.C. section 360bbb-3(b)(1), unless the authorization is terminated or revoked.  Performed at [facility], [address] [place], [place] [address]   Blood Culture (routine x 2)     Status: None   Collection Time: 02/20/23  6:28 AM   Specimen: BLOOD  Result Value Ref Range Status   Specimen Description BLOOD RIGHT FA  Final   Special Requests   Final    BOTTLES DRAWN AEROBIC AND ANAEROBIC Blood Culture results may not be optimal due to an inadequate volume of blood received in culture bottles   Culture   Final    NO GROWTH 5 DAYS Performed at [facility], [address] [place], [place] [address]    Report Status 02/25/2023 FINAL  Final  Blood Culture (routine x 2)     Status: None   Collection Time: 02/20/23  6:28 AM   Specimen: BLOOD  Result Value Ref Range  Status   Specimen Description BLOOD LEFT FA  Final   Special Requests   Final    BOTTLES DRAWN AEROBIC AND ANAEROBIC Blood Culture results may not be optimal due to an inadequate volume of blood received in culture bottles   Culture   Final    NO GROWTH 5 DAYS Performed at [facility], [address] [place], [place] [address]    Report Status 02/25/2023 FINAL  Final  Urine Culture (for pregnant, neutropenic or urologic patients or patients with an indwelling urinary catheter)     Status: Abnormal   Collection Time: 02/20/23  8:42 AM   Specimen: Urine, Clean Catch  Result Value Ref Range Status   Specimen Description   Final    URINE, CLEAN CATCH Performed at [facility], [address] [place], [place] [address]    Special Requests   Final    NONE Performed at [facility], [address] [place], [place] [address]    Culture (A)  Final    60,000 COLONIES/mL ENTEROCOCCUS FAECIUM VANCOMYCIN RESISTANT ENTEROCOCCUS ISOLATED    Report Status 02/23/2023 FINAL  Final   Organism ID, Bacteria ENTEROCOCCUS FAECIUM (A)  Final      Susceptibility   Enterococcus faecium - MIC*    AMPICILLIN >=32 RESISTANT Resistant     NITROFURANTOIN 32 SENSITIVE Sensitive     VANCOMYCIN >=32 RESISTANT Resistant     * 60,000  COLONIES/mL ENTEROCOCCUS FAECIUM    Labs: CBC: Recent Labs  Lab 02/20/23 0628  WBC 9.6  NEUTROABS 7.9*  HGB 12.2  HCT 37.6  MCV 89.1  PLT 267   Basic Metabolic Panel: Recent Labs  Lab 02/20/23 0628 02/21/23 0637 02/22/23 0636 02/23/23 0834  NA 138 140 140 140  K 3.7 3.7 3.6 4.0  CL 105 102 103 102  CO2 23 24 26 27   GLUCOSE 106* 89 99 96  BUN 13 11 14 15   CREATININE 1.12* 1.33* 1.15* 0.91  CALCIUM 8.6* 8.7* 8.5* 8.8*  MG  --   --  2.0  --    Liver Function Tests: Recent Labs  Lab 02/20/23 0628  AST 57*  ALT 23  ALKPHOS 56  BILITOT 1.0  PROT 6.1*  ALBUMIN 3.3*   CBG: Recent Labs  Lab 02/20/23 0640  GLUCAP 96     Discharge time spent: greater than 30 minutes.  Signed: Pennie Banter, DO [facility] 02/25/2023

## 2023-02-25 NOTE — TOC Progression Note (Signed)
Transition of Care [facility]) - Progression Note    Patient Details  Name: Lindsay Snyder MRN: [id number] Date of Birth: 1945-02-19  Transition of [facility]) CM/SW Contact  Garret Reddish, RN Phone Number: 02/25/2023, 4:12 PM  Clinical Narrative:    Chart reviewed.  I have spoken with Manager at [employer].  She informs me that patient will need a hospital bed and a wheelchair prior to the facility being able to accept her back.  Joyce Gross informed me that she has made [place] with [facility] aware of the needed DME.    I have spoken with Cala Bradford with [facility].  I  have made her aware that patient will need a hospital bed and a wheelchair delivered to the facility prior to returning back to the facility.  Cala Bradford will order needed DME today for a tentative discharge on tomorrow.    TOC will continue to follow for discharge planning.     Expected Discharge Plan: Assisted Living (with hospice) Barriers to Discharge: Continued Medical Work up  Expected Discharge Plan and Services     Post Acute Care Choice: Resumption of Svcs/PTA Provider, Hospice Living arrangements for the past 2 months: Assisted Living Facility Expected Discharge Date: 02/25/23                                     Social Determinants of Health (SDOH) Interventions SDOH Screenings   Food Insecurity: Patient Unable To Answer (02/20/2023)  Housing: Patient Unable To Answer (02/20/2023)  Transportation Needs: Patient Unable To Answer (02/20/2023)  Utilities: Patient Unable To Answer (02/20/2023)  Alcohol Screen: Low Risk  (05/17/2022)  Depression (PHQ2-9): Low Risk  (05/17/2022)  Recent Concern: Depression (PHQ2-9) - High Risk (03/01/2022)  Financial Resource Strain: Low Risk  (09/04/2022)  Physical Activity: Inactive (09/04/2022)  Social Connections: Patient Unable To Answer (02/20/2023)  Stress: No Stress Concern Present (09/04/2022)  Tobacco Use: Low Risk  (02/20/2023)    Readmission Risk  Interventions     No data to display

## 2023-02-25 NOTE — Patient Outreach (Signed)
  Care Coordination   Collabotation  Visit Note   02/25/2023 Name: CELISE BAZAR MRN: [id number] DOB: [birth year]  GLORA HULGAN is a 78 y.o. year old female who sees Doreene Nest, NP for primary care.   What matters to the patients health and wellness today? Received communication from hospital liaison. Patient to return to [facility] with Hospice care through [facility]     Goals Addressed   None     SDOH assessments and interventions completed:  No     Care Coordination Interventions:  Yes, provided  Interventions Today    Flowsheet Row Most Recent Value  General Interventions   General Interventions Discussed/Reviewed Communication with  Communication with RN  Tilford Pillar Liasion confirming that patient will discharge back to [facility] under Hospice care with [facility]]       Follow up plan: No further intervention required.   Encounter Outcome:  Patient Visit Completed

## 2023-02-25 NOTE — Progress Notes (Signed)
Occupational Therapy Treatment Patient Details Name: Lindsay Snyder MRN: [id number] DOB: 1945/03/26 Today's Date: 02/25/2023   History of present illness Lindsay Snyder is a 77yoF who comes to [facility] on 02/20/23 after unwitnessed fall x2 at ALF. Patient was started on Keflex for a recurrent UTI 2 days agoNew hypoxia as patient arrived with 3 L oxygen via nasal cannula, chest x-ray showed right-sided infiltrates on both left upper and lower lobe. PMH: alzheimers dementia, GAD, depression, recurrent UTI, frequent falls.  lives at [facility].   OT comments  Lindsay Snyder was seen for OT treatment on this date. Upon arrival to room pt in bed, agreeable to tx. Pt requires Fluctuating from SETUP with MIN cues to initiate and MAX A to self -feed in sitting. MAX A don B socks. Pt making good progress toward goals, will continue to follow POC. Discharge recommendation remains appropriate.        If plan is discharge home, recommend the following:  A little help with walking and/or transfers;A lot of help with bathing/dressing/bathroom;Assistance with cooking/housework;Assistance with feeding;Direct supervision/assist for medications management;Direct supervision/assist for financial management;Assist for transportation;Help with stairs or ramp for entrance;Supervision due to cognitive status   Equipment Recommendations  None recommended by OT    Recommendations for Other Services      Precautions / Restrictions Precautions Precautions: Fall Precaution Comments: dementia Restrictions Weight Bearing Restrictions Per Provider Order: No       Mobility Bed Mobility Overal bed mobility: Needs Assistance Bed Mobility: Supine to Sit, Sit to Supine     Supine to sit: Min assist Sit to supine: Mod assist        Transfers Overall transfer level: Needs assistance Equipment used: 1 person hand held assist Transfers: Sit to/from Stand Sit to Stand: Min assist                 Balance Overall  balance assessment: History of Falls, Needs assistance Sitting-balance support: Feet supported, No upper extremity supported Sitting balance-Leahy Scale: Good     Standing balance support: Single extremity supported Standing balance-Leahy Scale: Fair                             ADL either performed or assessed with clinical judgement   ADL Overall ADL's : Needs assistance/impaired                                       General ADL Comments: Fluctuating from SETUP with MIN cues to initiate and MAX A to self -feed in sitting. MAX A don B socks      Cognition Arousal: Alert Behavior During Therapy: Flat affect Overall Cognitive Status: History of cognitive impairments - at baseline                                 General Comments: word salad                   Pertinent Vitals/ Pain       Pain Assessment Pain Assessment: PAINAD Breathing: normal Negative Vocalization: none Facial Expression: smiling or inexpressive Body Language: relaxed Consolability: no need to console PAINAD Score: 0   Frequency  Min 1X/week        Progress Toward Goals  OT Goals(current goals can now be found  in the care plan section)  Progress towards OT goals: Progressing toward goals  Acute Rehab OT Goals OT Goal Formulation: Patient unable to participate in goal setting Time For Goal Achievement: 03/07/23 Potential to Achieve Goals: Fair ADL Goals Pt Will Transfer to Toilet: with contact guard assist;stand pivot transfer;bedside commode Pt Will Perform Toileting - Clothing Manipulation and hygiene: with max assist  Plan      Co-evaluation                 AM-PAC OT "6 Clicks" Daily Activity     Outcome Measure   Help from another person eating meals?: A Little Help from another person taking care of personal grooming?: A Little Help from another person toileting, which includes using toliet, bedpan, or urinal?: Total Help from  another person bathing (including washing, rinsing, drying)?: Total Help from another person to put on and taking off regular upper body clothing?: Total Help from another person to put on and taking off regular lower body clothing?: Total 6 Click Score: 10    End of Session    OT Visit Diagnosis: Muscle weakness (generalized) (M62.81);History of falling (Z91.81);Repeated falls (R29.6);Cognitive communication deficit (R41.841);Other symptoms and signs involving cognitive function Symptoms and signs involving cognitive functions: Other cerebrovascular disease   Activity Tolerance Patient tolerated treatment well   Patient Left in bed;with call bell/phone within reach;with bed alarm set;with nursing/sitter in room   Nurse Communication          Time: 1610-9604 OT Time Calculation (min): 23 min  Charges: OT General Charges $OT Visit: 1 Visit OT Treatments $Self Care/Home Management : 23-37 mins  Kathie Dike, M.S. OTR/L  02/25/23, 10:06 AM  ascom [phone]

## 2023-02-25 NOTE — Care Management Important Message (Signed)
Important Message  Patient Details  Name: EUSTOLIA DRENNEN MRN: [id number] Date of Birth: [birth year]   Important Message Given:  Yes - Medicare IM     Cristela Blue, CMA 02/25/2023, 10:29 AM

## 2023-02-25 NOTE — Consult Note (Signed)
   [facility] Inpatient Consult   02/25/2023  Lindsay Snyder September 03, 1945 [id number]  Primary Care Provider:  Vernona Rieger, NP [facility]).  Patient is currently active with Care Management for chronic disease management services.  Patient has been engaged by a  Tourist information centre manager.  Our community based plan of care has focused on disease management and community resource support.   Patient will receive a post hospital call and will be evaluated for assessments and disease process education.   Plan: Liaison will collaborate with the involved VBCI SW concerning discharge disposition.  Inpatient Transition Of Care [TOC] team member to make aware that Care Management following.  Of note, Care Management services does not replace or interfere with any services that are needed or arranged by inpatient [facility] care management team.   For additional questions or referrals please contact:    Elliot Cousin, RN, [facility] Liaison [place]   [facility], Population Health Office Hours MTWF  8:00 am-6:00 pm Direct Dial: [phone] mobile [phone] [Office toll free line] Office Hours are M-F 8:30 - 5 pm [email or web address]

## 2023-02-26 MED ORDER — SENNOSIDES-DOCUSATE SODIUM 8.6-50 MG PO TABS: 1.0000 | ORAL_TABLET | Freq: Two times a day (BID) | ORAL | Status: DC | PRN

## 2023-02-26 MED ORDER — POLYETHYLENE GLYCOL 3350 17 G PO PACK: 17.0000 g | PACK | Freq: Every day | ORAL | Status: DC | PRN

## 2023-02-26 NOTE — TOC Transition Note (Signed)
Transition of Care [facility]) - Discharge Note   Patient Details  Name: Lindsay Snyder MRN: [id number] Date of Birth: 03-17-1945  Transition of Care [facility]) CM/SW Contact:  Garret Reddish, RN Phone Number: 02/26/2023, 3:32 PM   Clinical Narrative:    Chart reviewed.  Noted that patient has orders for discharge today.  I have spoken with Joyce Gross, Production designer, theatre/television/film at [facility] and Maralyn Sago, Immunologist for [employer].  They both have informed me that patient is able to return today.  They have informed me to have staff nurse to call report to [phone].  I have faxed Joyce Gross with [facility] patient Fl 2 and Discharge Summary.    I have confirmed with Cala Bradford with [facility] that patient has received the Hospital bed and wheelchair at the facility.  [facility] will open up patient's case today.    I have informed patient's daughter [name] that patient will be a discharge for today.    I have arranged [facility] to transport patient home.    I have informed staff nurse of the above information.     Final next level of care: Assisted Living (With Hospice following) Barriers to Discharge: No Barriers Identified   Patient Goals and CMS Choice   CMS Medicare.gov Compare Post Acute Care list provided to:: Patient Choice offered to / list presented to : Patient      Discharge Placement                Patient to be transferred to facility by: [facility] Name of family member notified: Lucretia Patient and family notified of of transfer: 02/26/23  Discharge Plan and Services Additional resources added to the After Visit Summary for       Post Acute Care Choice: Resumption of Svcs/PTA Provider, Hospice          DME Arranged:  ([facility] to arrange Hospital bed and wheelchair for use at the Assisted Living.)                    Social Drivers of Health (SDOH) Interventions SDOH Screenings   Food Insecurity: Patient Unable To Answer  (02/20/2023)  Housing: Patient Unable To Answer (02/20/2023)  Transportation Needs: Patient Unable To Answer (02/20/2023)  Utilities: Patient Unable To Answer (02/20/2023)  Alcohol Screen: Low Risk  (05/17/2022)  Depression (PHQ2-9): Low Risk  (05/17/2022)  Recent Concern: Depression (PHQ2-9) - High Risk (03/01/2022)  Financial Resource Strain: Low Risk  (09/04/2022)  Physical Activity: Inactive (09/04/2022)  Social Connections: Patient Unable To Answer (02/20/2023)  Stress: No Stress Concern Present (09/04/2022)  Tobacco Use: Low Risk  (02/20/2023)     Readmission Risk Interventions     No data to display

## 2023-02-26 NOTE — Plan of Care (Signed)

## 2023-02-26 NOTE — NC FL2 (Addendum)
[place] MEDICAID FL2 LEVEL OF CARE FORM     IDENTIFICATION  Patient Name: Lindsay Snyder Birthdate: 1945/07/05 Sex: female Admission Date (Current Location): 02/20/2023  [place] and [place] Number:  [address] [id number] [facility] Facility and Address:  [facility], [address], [place], [place] [address]      Provider Number: [id number]  Attending Physician Name and Address:  Pennie Banter, DO  Relative Name and Phone Number:  Jeremiah Tarpley [phone]    Current Level of Care: Hospital Recommended Level of Care: Assisted Living Facility (With Hospice following) Prior Approval Number:    Date Approved/Denied:   PASRR Number:    Discharge Plan:  (Assisted Living with Hospice following)    Current Diagnoses: Patient Active Problem List   Diagnosis Date Noted   Acute on chronic diastolic CHF (congestive heart failure) (HCC) 02/20/2023   CHF (congestive heart failure) (HCC) 02/20/2023   Goals of care, counseling/discussion 01/15/2023   Altered mental status 01/13/2023   History of rhabdomyolysis 01/13/2023   Acute encephalopathy 01/01/2023   UTI (urinary tract infection) 01/01/2023   Encephalopathy 01/01/2023   Lethargy 09/06/2022   Cancer (HCC) 05/02/2022   Vaginal itching 03/01/2022   AKI (acute kidney injury) (HCC) 02/05/2022   Dementia (HCC) 02/05/2022   Mood disorder (HCC) 02/05/2022   Overactive bladder 02/05/2022   Persistent cough for 3 weeks or longer 06/29/2021   Recurrent falls 06/29/2021   Osteoarthritis of hands, 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   Lower abdominal pain 02/03/2021   Urinary incontinence 02/03/2021   Memory changes 07/05/2020   Gastroesophageal reflux disease 07/05/2020   Generalized abdominal pain 03/01/2020   Environmental and seasonal allergies 01/14/2020   GAD (generalized anxiety disorder) 01/14/2020   Pain of left upper extremity 05/26/2019   Oral lesion 02/26/2019   Hemorrhoids 07/11/2017   Preventative health  care 07/11/2017   Hyperlipidemia 07/10/2016   Insomnia due to stress 09/30/2014   Medicare annual wellness visit, subsequent 05/17/2014   Murmur, cardiac 05/17/2014   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   Rash and nonspecific skin eruption 04/15/2014   Osteopenia 04/15/2014   Obesity 04/15/2014    Orientation RESPIRATION BLADDER Height & Weight     Self  Normal Incontinent Weight: 96.7 kg Height:  5\' 2"  (157.5 cm)  BEHAVIORAL SYMPTOMS/MOOD NEUROLOGICAL BOWEL NUTRITION STATUS      Incontinent  (See Discharge Summary)  AMBULATORY STATUS COMMUNICATION OF NEEDS Skin   Extensive Assist Verbally Normal                       Personal Care Assistance Level of Assistance  Bathing, Feeding, Dressing Bathing Assistance: Maximum assistance Feeding assistance: Maximum assistance Dressing Assistance: Maximum assistance     Functional Limitations Info  Sight, Hearing, Speech Sight Info: Adequate Hearing Info: Adequate Speech Info: Adequate    SPECIAL CARE FACTORS FREQUENCY                       Contractures Contractures Info: Not present    Additional Factors Info  Code Status, Allergies Code Status Info: DNR-Limited Allergies Info: Crestor (Rosuvastatin Calcium), Tylenol (Acetaminophen)           Current Medications (02/26/2023):  This is the current hospital active medication list Current Facility-Administered Medications  Medication Dose Route Frequency Provider Last Rate Last Admin   diclofenac Sodium (VOLTAREN) 1 % topical gel 4 g  4 g Topical TID PRN Emeline General, MD  donepezil (ARICEPT) tablet 5 mg  5 mg Oral QHS Mikey College T, MD   5 mg at 02/25/23 2146   DULoxetine (CYMBALTA) DR capsule 20 mg  20 mg Oral BID Mikey College T, MD   20 mg at 02/25/23 2146   enoxaparin (LOVENOX) injection 40 mg  40 mg Subcutaneous Q24H Mikey College T, MD   40 mg at 02/25/23 2147   furosemide (LASIX) tablet 40 mg  40 mg Oral Daily Esaw Grandchild A, DO   40  mg at 02/26/23 0842   ipratropium-albuterol (DUONEB) 0.5-2.5 (3) MG/3ML nebulizer solution 3 mL  3 mL Nebulization Q6H PRN Mikey College T, MD       lithium carbonate capsule 150 mg  150 mg Oral Daily Mikey College T, MD   150 mg at 02/26/23 0842   loratadine (CLARITIN) tablet 10 mg  10 mg Oral Daily Mikey College T, MD   10 mg at 02/26/23 1126   memantine (NAMENDA) tablet 10 mg  10 mg Oral Daily Mikey College T, MD   10 mg at 02/26/23 1126   mirtazapine (REMERON) tablet 15 mg  15 mg Oral QHS Mikey College T, MD   15 mg at 02/25/23 2147   ondansetron (ZOFRAN) injection 4 mg  4 mg Intravenous Q6H PRN Mikey College T, MD       ondansetron (ZOFRAN-ODT) disintegrating tablet 4 mg  4 mg Oral Q8H PRN Mikey College T, MD   4 mg at 02/23/23 2205   oxybutynin (DITROPAN-XL) 24 hr tablet 15 mg  15 mg Oral QHS Mikey College T, MD   15 mg at 02/25/23 2146   polyethylene glycol (MIRALAX / GLYCOLAX) packet 17 g  17 g Oral Daily PRN Esaw Grandchild A, DO       QUEtiapine (SEROQUEL) tablet 25 mg  25 mg Oral QHS Esaw Grandchild A, DO   25 mg at 02/25/23 2146   senna-docusate (Senokot-S) tablet 1 tablet  1 tablet Oral BID PRN Pennie Banter, DO       traMADol Janean Sark) tablet 50 mg  50 mg Oral Q8H PRN Esaw Grandchild A, DO   50 mg at 02/23/23 0854   traZODone (DESYREL) tablet 100 mg  100 mg Oral QHS Emeline General, MD   100 mg at 02/25/23 2146     Discharge Medications: Please see discharge summary for a list of discharge medications. TAKE these medications     cetirizine 10 MG tablet Commonly known as: ZYRTEC Take 10 mg by mouth daily.    diclofenac Sodium 1 % Gel Commonly known as: VOLTAREN Apply 4 g topically 3 (three) times daily as needed (pain). (Apply to affected area of legs)    donepezil 5 MG tablet Commonly known as: ARICEPT Take 5 mg by mouth at bedtime.    DULoxetine 20 MG capsule Commonly known as: CYMBALTA TAKE 2 CAPSULES(40 MG) BY MOUTH DAILY FOR ANXIETY    furosemide 40 MG tablet Commonly known  as: LASIX Take 1 tablet (40 mg total) by mouth daily.    lithium carbonate 150 MG capsule Take 150 mg by mouth daily.    memantine 5 MG tablet Commonly known as: NAMENDA Take 2 tablets (10 mg total) by mouth daily. For memory    mirtazapine 15 MG tablet Commonly known as: REMERON Take 15 mg by mouth at bedtime.    nitrofurantoin (macrocrystal-monohydrate) 100 MG capsule Commonly known as: MACROBID Take 1 capsule (100 mg total) by mouth every 12 (twelve) hours for 2  days.    ondansetron 4 MG disintegrating tablet Commonly known as: ZOFRAN-ODT Take 1 tablet (4 mg total) by mouth every 8 (eight) hours as needed for nausea or vomiting.    oxybutynin 15 MG 24 hr tablet Commonly known as: DITROPAN XL TAKE 1 TABLET(15 MG) BY MOUTH AT BEDTIME FOR OVERACTIVE BLADDER    QUEtiapine 25 MG tablet Commonly known as: SEROQUEL Take 1 tablet (25 mg total) by mouth at bedtime.    traZODone 100 MG tablet Commonly known as: DESYREL Take 100 mg by mouth at bedtime.        Relevant Imaging Results:  Relevant Lab Results:   Additional Information [id number]  Garret Reddish, RN

## 2023-02-26 NOTE — Progress Notes (Signed)
Discharge AVS in packet and reviewed with director at ALF facility this afternoon. This RN called EMS around 1645 to get updated ETA. At that time, patient was 2nd on pickup list.   Patient IV removed, discharge documentation complete, care plan and education resolved.

## 2023-02-26 NOTE — Progress Notes (Signed)
Patient alert and oriented to self. No distress noted. Vital signs taken and given to transport staff. Patient transferred to stretcher and discharged as per orders.

## 2023-03-01 ENCOUNTER — Other Ambulatory Visit: Payer: Self-pay

## 2023-03-01 DIAGNOSIS — F411 Generalized anxiety disorder: Secondary | ICD-10-CM

## 2024-01-24 ENCOUNTER — Encounter: Payer: Self-pay | Admitting: Emergency Medicine

## 2024-01-24 ENCOUNTER — Other Ambulatory Visit: Payer: Self-pay

## 2024-01-24 ENCOUNTER — Emergency Department
Admission: EM | Admit: 2024-01-24 | Discharge: 2024-01-25 | Disposition: A | Discharge status: Home / Self Care | Attending: Emergency Medicine | Admitting: Emergency Medicine

## 2024-01-24 ENCOUNTER — Emergency Department

## 2024-01-24 DIAGNOSIS — F0390 Unspecified dementia without behavioral disturbance: Secondary | ICD-10-CM | POA: Insufficient documentation

## 2024-01-24 DIAGNOSIS — N390 Urinary tract infection, site not specified: Secondary | ICD-10-CM | POA: Diagnosis not present

## 2024-01-24 DIAGNOSIS — R197 Diarrhea, unspecified: Secondary | ICD-10-CM | POA: Diagnosis not present

## 2024-01-24 DIAGNOSIS — R112 Nausea with vomiting, unspecified: Secondary | ICD-10-CM | POA: Diagnosis present

## 2024-01-24 LAB — URINALYSIS, ROUTINE W REFLEX MICROSCOPIC
Bilirubin Urine: NEGATIVE
Glucose, UA: NEGATIVE mg/dL
Hgb urine dipstick: NEGATIVE
Ketones, ur: NEGATIVE mg/dL
Leukocytes,Ua: NEGATIVE
Nitrite: POSITIVE — AB
Protein, ur: NEGATIVE mg/dL
Specific Gravity, Urine: 1.017 (ref 1.005–1.030)
pH: 6 (ref 5.0–8.0)

## 2024-01-24 LAB — RESP PANEL BY RT-PCR (RSV, FLU A&B, COVID)  RVPGX2
Influenza A by PCR: NEGATIVE
Influenza B by PCR: NEGATIVE
Resp Syncytial Virus by PCR: NEGATIVE
SARS Coronavirus 2 by RT PCR: NEGATIVE

## 2024-01-24 LAB — CBC
HCT: 43.9 % (ref 36.0–46.0)
Hemoglobin: 14.5 g/dL (ref 12.0–15.0)
MCH: 28.9 pg (ref 26.0–34.0)
MCHC: 33 g/dL (ref 30.0–36.0)
MCV: 87.5 fL (ref 80.0–100.0)
Platelets: 184 K/uL (ref 150–400)
RBC: 5.02 MIL/uL (ref 3.87–5.11)
RDW: 12.7 % (ref 11.5–15.5)
WBC: 6.5 K/uL (ref 4.0–10.5)
nRBC: 0 % (ref 0.0–0.2)

## 2024-01-24 LAB — COMPREHENSIVE METABOLIC PANEL WITH GFR
ALT: 5 U/L (ref 0–44)
AST: 23 U/L (ref 15–41)
Albumin: 3.8 g/dL (ref 3.5–5.0)
Alkaline Phosphatase: 102 U/L (ref 38–126)
Anion gap: 16 — ABNORMAL HIGH (ref 5–15)
BUN: 24 mg/dL — ABNORMAL HIGH (ref 8–23)
CO2: 23 mmol/L (ref 22–32)
Calcium: 9 mg/dL (ref 8.9–10.3)
Chloride: 109 mmol/L (ref 98–111)
Creatinine, Ser: 1.23 mg/dL — ABNORMAL HIGH (ref 0.44–1.00)
GFR, Estimated: 44 mL/min — ABNORMAL LOW
Glucose, Bld: 109 mg/dL — ABNORMAL HIGH (ref 70–99)
Potassium: 4.7 mmol/L (ref 3.5–5.1)
Sodium: 147 mmol/L — ABNORMAL HIGH (ref 135–145)
Total Bilirubin: 0.6 mg/dL (ref 0.0–1.2)
Total Protein: 6.7 g/dL (ref 6.5–8.1)

## 2024-01-24 LAB — LIPASE, BLOOD: Lipase: 25 U/L (ref 11–51)

## 2024-01-24 MED ORDER — SODIUM CHLORIDE 0.9 % IV BOLUS
1000.0000 mL | Freq: Once | INTRAVENOUS | Status: AC
  Administered 2024-01-24: 1000 mL via INTRAVENOUS

## 2024-01-24 MED ORDER — CEPHALEXIN 500 MG PO CAPS: 500.0000 mg | ORAL_CAPSULE | Freq: Three times a day (TID) | ORAL | 0 refills | Status: DC

## 2024-01-24 MED ORDER — SODIUM CHLORIDE 0.9 % IV SOLN
1.0000 g | Freq: Once | INTRAVENOUS | Status: AC
  Administered 2024-01-24: 1 g via INTRAVENOUS
  Filled 2024-01-24: qty 10

## 2024-01-24 MED ORDER — CEPHALEXIN 500 MG PO CAPS: 500.0000 mg | ORAL_CAPSULE | Freq: Three times a day (TID) | ORAL | 0 refills | Status: AC

## 2024-01-24 NOTE — ED Triage Notes (Signed)
 See first nurse note. N/V/D that started today. From [facility].

## 2024-01-24 NOTE — ED Provider Notes (Signed)
 "  [facility] Provider Note    Event Date/Time   First MD Initiated Contact with Patient 01/24/24 1700     (approximate)   History   Emesis   HPI  Lindsay Snyder is a 78 y.o. female who presents from her living facility today because concerns for nausea vomiting diarrhea.  Patient has history and is unable to give any meaningful history.  Family is at bedside.  They state that another family member had visited her a couple days ago and states she was in her normal state of health.  This morning however they were told she started developing nausea vomiting and diarrhea.  Since they have been with her in the emergency department they do think that she is likely little bit more disoriented than normal.      Physical Exam   Triage Vital Signs: ED Triage Vitals  Encounter Vitals Group     BP 01/24/24 1449 (!) 154/68     Girls Systolic BP Percentile --      Girls Diastolic BP Percentile --      Boys Systolic BP Percentile --      Boys Diastolic BP Percentile --      Pulse Rate 01/24/24 1449 73     Resp 01/24/24 1449 19     Temp 01/24/24 1449 98.6 F (37 C)     Temp Source 01/24/24 1449 Oral     SpO2 01/24/24 1446 97 %     Weight --      Height --      Head Circumference --      Peak Flow --      Pain Score --      Pain Loc --      Pain Education --      Exclude from Growth Chart --     Most recent vital signs: Vitals:   01/24/24 1446 01/24/24 1449  BP:  (!) 154/68  Pulse:  73  Resp:  19  Temp:  98.6 F (37 C)  SpO2: 97% 100%   General: Awake, alert, not oriented CV:  Good peripheral perfusion. Regular rate and rhythm, systolic murmur Resp:  Normal effort. Lungs clear. Abd:  No distention. Non tender.   ED Results / Procedures / Treatments   Labs (all labs ordered are listed, but only abnormal results are displayed) Labs Reviewed  COMPREHENSIVE METABOLIC PANEL WITH GFR - Abnormal; Notable for the following components:      Result  Value   Sodium 147 (*)    Glucose, Bld 109 (*)    BUN 24 (*)    Creatinine, Ser 1.23 (*)    GFR, Estimated 44 (*)    Anion gap 16 (*)    All other components within normal limits  URINALYSIS, ROUTINE W REFLEX MICROSCOPIC - Abnormal; Notable for the following components:   Color, Urine YELLOW (*)    APPearance HAZY (*)    Nitrite POSITIVE (*)    Bacteria, UA RARE (*)    All other components within normal limits  RESP PANEL BY RT-PCR (RSV, FLU A&B, COVID)  [id number]  URINE CULTURE  LIPASE, BLOOD  CBC     EKG  None   RADIOLOGY I independently interpreted and visualized the CT head. My interpretation: No ICH Radiology interpretation:  IMPRESSION:  1. No acute intracranial abnormality.  2. Persistent right mastoid effusion with interval resolution of fluid within  the right middle ear.      PROCEDURES:  Critical  Care performed: No    MEDICATIONS ORDERED IN ED: Medications - No data to display   IMPRESSION / MDM / ASSESSMENT AND PLAN / ED COURSE  I reviewed the triage vital signs and the nursing notes.                              Differential diagnosis includes, but is not limited to, ICH, anemia, electrolyte abnormality, gastroenteritis, UTI  Patient's presentation is most consistent with acute presentation with potential threat to life or bodily function.   Patient presented to the emergency department today because of concerns for nausea vomiting diarrhea and some increased mental status change.  His head CT here without concerning abnormality.  Blood work without concerning leukocytosis anemia or electrolyte abnormality.  UA is consistent with urinary tract infection I do think this could play in some of the patient's nausea and altered mental status.  Patient potentially have a gastroenteritis as well.  Patient was given dose of IV antibiotics here.  Do think would be reasonable for patient be discharged for further treatment with oral antibiotics at this time.      FINAL CLINICAL IMPRESSION(S) / ED DIAGNOSES   Final diagnoses:  Lower urinary tract infectious disease      Note:  This document was prepared using Dragon voice recognition software and may include unintentional dictation errors.    Floy Roberts, MD 01/25/24 0020  "

## 2024-01-24 NOTE — ED Triage Notes (Signed)
 Pt arrives via EMS from [facility] with reports of n/v/d starting today. Baseline dementia.

## 2024-01-24 NOTE — ED Notes (Signed)
 Spoke with [facility] at 10:44pm and rep is Paramedic. They will be here to get the patient to transport her back to [facility]. Patient is next on there list

## 2024-01-24 NOTE — ED Notes (Signed)
 Lab at bedside

## 2024-01-24 NOTE — ED Notes (Signed)
 Pt's bed alarm going off, pt noted to have scooted down toward the end of the bed. Pt assisted back into bed with assistance of additional RN. Bed alarm on, bed in low, locked position

## 2024-01-24 NOTE — ED Notes (Signed)
 Lab called for 2nd time to obtain labs on pt

## 2024-01-25 NOTE — ED Notes (Signed)
 Pt soiled brief. Noted to have urine and stool. Pt and linens changed. Provided with new warm blankets and repositioned in bed. Pt awaiting [facility] transport

## 2024-01-27 LAB — URINE CULTURE: Culture: >=100000 — AB
